# Patient Record
Sex: Male | Born: 1980
Health system: Southern US, Community
[De-identification: ages and names within clinical notes are randomized; demographics above are authoritative.]

## PROBLEM LIST (undated history)

## (undated) DIAGNOSIS — F32A Depression, unspecified: Secondary | ICD-10-CM

## (undated) DIAGNOSIS — F329 Major depressive disorder, single episode, unspecified: Secondary | ICD-10-CM

## (undated) DIAGNOSIS — D126 Benign neoplasm of colon, unspecified: Secondary | ICD-10-CM

## (undated) DIAGNOSIS — F419 Anxiety disorder, unspecified: Secondary | ICD-10-CM

## (undated) DIAGNOSIS — F429 Obsessive-compulsive disorder, unspecified: Secondary | ICD-10-CM

## (undated) DIAGNOSIS — I10 Essential (primary) hypertension: Secondary | ICD-10-CM

## (undated) DIAGNOSIS — G4730 Sleep apnea, unspecified: Secondary | ICD-10-CM

## (undated) DIAGNOSIS — F952 Tourette's disorder: Secondary | ICD-10-CM

## (undated) DIAGNOSIS — R7302 Impaired glucose tolerance (oral): Secondary | ICD-10-CM

## (undated) DIAGNOSIS — E669 Obesity, unspecified: Secondary | ICD-10-CM

## (undated) DIAGNOSIS — F609 Personality disorder, unspecified: Secondary | ICD-10-CM

## (undated) DIAGNOSIS — I8393 Asymptomatic varicose veins of bilateral lower extremities: Secondary | ICD-10-CM

## (undated) DIAGNOSIS — G4700 Insomnia, unspecified: Secondary | ICD-10-CM

## (undated) DIAGNOSIS — K219 Gastro-esophageal reflux disease without esophagitis: Secondary | ICD-10-CM

## (undated) HISTORY — DX: Anxiety disorder, unspecified: F41.9

## (undated) HISTORY — DX: Impaired glucose tolerance (oral): R73.02

## (undated) HISTORY — DX: Insomnia, unspecified: G47.00

## (undated) HISTORY — DX: Tourette's disorder: F95.2

## (undated) HISTORY — DX: Essential (primary) hypertension: I10

## (undated) HISTORY — DX: Personality disorder, unspecified: F60.9

## (undated) HISTORY — DX: Obsessive-compulsive disorder, unspecified: F42.9

## (undated) HISTORY — DX: Asymptomatic varicose veins of bilateral lower extremities: I83.93

## (undated) HISTORY — DX: Obesity, unspecified: E66.9

## (undated) HISTORY — DX: Major depressive disorder, single episode, unspecified: F32.9

## (undated) HISTORY — DX: Depression, unspecified: F32.A

## (undated) HISTORY — DX: Benign neoplasm of colon, unspecified: D12.6

## (undated) HISTORY — DX: Gastro-esophageal reflux disease without esophagitis: K21.9

---
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
## 2003-06-24 ENCOUNTER — Ambulatory Visit (HOSPITAL_COMMUNITY): Admission: RE | Admit: 2003-06-24 | Discharge: 2003-06-24 | Payer: Self-pay | Admitting: Internal Medicine

## 2004-03-08 ENCOUNTER — Ambulatory Visit: Payer: Self-pay | Admitting: Internal Medicine

## 2004-03-18 ENCOUNTER — Emergency Department (HOSPITAL_COMMUNITY): Admission: EM | Admit: 2004-03-18 | Discharge: 2004-03-18 | Payer: Self-pay | Admitting: Emergency Medicine

## 2004-03-23 ENCOUNTER — Ambulatory Visit: Payer: Self-pay | Admitting: Internal Medicine

## 2004-03-28 ENCOUNTER — Ambulatory Visit: Payer: Self-pay | Admitting: Internal Medicine

## 2004-03-30 ENCOUNTER — Ambulatory Visit: Payer: Self-pay | Admitting: Internal Medicine

## 2005-12-16 ENCOUNTER — Ambulatory Visit: Payer: Self-pay | Admitting: Family Medicine

## 2006-05-02 ENCOUNTER — Ambulatory Visit: Payer: Self-pay | Admitting: Internal Medicine

## 2006-05-02 LAB — CONVERTED CEMR LAB
ALT: 58 units/L — ABNORMAL HIGH (ref 0–40)
AST: 37 units/L (ref 0–37)
Basophils Relative: 0.3 % (ref 0.0–1.0)
Bilirubin, Direct: 0.2 mg/dL (ref 0.0–0.3)
CO2: 29 meq/L (ref 19–32)
Calcium: 9.5 mg/dL (ref 8.4–10.5)
Chloride: 107 meq/L (ref 96–112)
Creatinine, Ser: 1 mg/dL (ref 0.4–1.5)
Eosinophils Relative: 3.3 % (ref 0.0–5.0)
GFR calc non Af Amer: 97 mL/min
Glucose, Bld: 110 mg/dL — ABNORMAL HIGH (ref 70–99)
HCT: 50 % (ref 39.0–52.0)
Neutrophils Relative %: 50.7 % (ref 43.0–77.0)
Platelets: 252 10*3/uL (ref 150–400)
RBC: 5.8 M/uL (ref 4.22–5.81)
RDW: 12.2 % (ref 11.5–14.6)
Sodium: 143 meq/L (ref 135–145)
Total Bilirubin: 1 mg/dL (ref 0.3–1.2)
Total CHOL/HDL Ratio: 6.2
Triglycerides: 111 mg/dL (ref 0–149)
VLDL: 22 mg/dL (ref 0–40)
WBC: 4.5 10*3/uL (ref 4.5–10.5)

## 2007-04-11 ENCOUNTER — Ambulatory Visit: Payer: Self-pay | Admitting: Internal Medicine

## 2007-04-11 DIAGNOSIS — M549 Dorsalgia, unspecified: Secondary | ICD-10-CM

## 2007-04-11 DIAGNOSIS — E739 Lactose intolerance, unspecified: Secondary | ICD-10-CM

## 2007-04-11 DIAGNOSIS — F419 Anxiety disorder, unspecified: Secondary | ICD-10-CM | POA: Insufficient documentation

## 2007-04-11 DIAGNOSIS — F411 Generalized anxiety disorder: Secondary | ICD-10-CM

## 2007-04-11 DIAGNOSIS — K219 Gastro-esophageal reflux disease without esophagitis: Secondary | ICD-10-CM | POA: Insufficient documentation

## 2007-04-11 DIAGNOSIS — J309 Allergic rhinitis, unspecified: Secondary | ICD-10-CM

## 2007-04-11 LAB — CONVERTED CEMR LAB
Hemoglobin, Urine: NEGATIVE
Nitrite: NEGATIVE
Urine Glucose: NEGATIVE mg/dL

## 2007-09-10 ENCOUNTER — Encounter: Admission: RE | Admit: 2007-09-10 | Discharge: 2007-09-10 | Payer: Self-pay | Admitting: Specialist

## 2007-09-11 ENCOUNTER — Ambulatory Visit: Payer: Self-pay | Admitting: Internal Medicine

## 2007-09-11 DIAGNOSIS — L03119 Cellulitis of unspecified part of limb: Secondary | ICD-10-CM

## 2007-09-11 DIAGNOSIS — L02619 Cutaneous abscess of unspecified foot: Secondary | ICD-10-CM

## 2008-01-16 ENCOUNTER — Encounter: Payer: Self-pay | Admitting: Internal Medicine

## 2008-07-23 ENCOUNTER — Ambulatory Visit: Payer: Self-pay | Admitting: Internal Medicine

## 2008-07-23 DIAGNOSIS — L219 Seborrheic dermatitis, unspecified: Secondary | ICD-10-CM | POA: Insufficient documentation

## 2008-07-23 DIAGNOSIS — R350 Frequency of micturition: Secondary | ICD-10-CM

## 2008-07-23 LAB — CONVERTED CEMR LAB
Albumin: 4.7 g/dL (ref 3.5–5.2)
Basophils Relative: 0.1 % (ref 0.0–3.0)
Bilirubin Urine: NEGATIVE
Bilirubin Urine: NEGATIVE
Blood in Urine, dipstick: NEGATIVE
CO2: 28 meq/L (ref 19–32)
Chloride: 104 meq/L (ref 96–112)
Eosinophils Absolute: 0 10*3/uL (ref 0.0–0.7)
Glucose, Urine, Semiquant: NEGATIVE
Hemoglobin, Urine: NEGATIVE
Hemoglobin: 17.6 g/dL — ABNORMAL HIGH (ref 13.0–17.0)
Hgb A1c MFr Bld: 5.5 % (ref 4.6–6.5)
Leukocytes, UA: NEGATIVE
Lymphs Abs: 1.3 10*3/uL (ref 0.7–4.0)
MCHC: 35.3 g/dL (ref 30.0–36.0)
MCV: 87.7 fL (ref 78.0–100.0)
Monocytes Absolute: 0.5 10*3/uL (ref 0.1–1.0)
Neutro Abs: 4.1 10*3/uL (ref 1.4–7.7)
Nitrite: NEGATIVE
Protein, U semiquant: NEGATIVE
RBC: 5.7 M/uL (ref 4.22–5.81)
Sodium: 140 meq/L (ref 135–145)
Total Protein, Urine: NEGATIVE mg/dL
Total Protein: 8.2 g/dL (ref 6.0–8.3)
Urobilinogen, UA: 0.2
WBC Urine, dipstick: NEGATIVE
pH: 8.5

## 2008-07-24 ENCOUNTER — Encounter: Payer: Self-pay | Admitting: Internal Medicine

## 2008-08-25 ENCOUNTER — Ambulatory Visit: Payer: Self-pay | Admitting: Internal Medicine

## 2008-08-25 DIAGNOSIS — R197 Diarrhea, unspecified: Secondary | ICD-10-CM

## 2008-12-25 ENCOUNTER — Ambulatory Visit: Payer: Self-pay | Admitting: Internal Medicine

## 2008-12-25 DIAGNOSIS — S300XXA Contusion of lower back and pelvis, initial encounter: Secondary | ICD-10-CM | POA: Insufficient documentation

## 2008-12-25 DIAGNOSIS — E559 Vitamin D deficiency, unspecified: Secondary | ICD-10-CM | POA: Insufficient documentation

## 2008-12-25 LAB — CONVERTED CEMR LAB: Vit D, 25-Hydroxy: 19 ng/mL — ABNORMAL LOW (ref 30–89)

## 2008-12-28 LAB — CONVERTED CEMR LAB
ALT: 73 units/L — ABNORMAL HIGH (ref 0–53)
Basophils Relative: 0.4 % (ref 0.0–3.0)
Bilirubin Urine: NEGATIVE
CO2: 30 meq/L (ref 19–32)
Chloride: 103 meq/L (ref 96–112)
Creatinine, Ser: 0.9 mg/dL (ref 0.4–1.5)
Eosinophils Relative: 2.1 % (ref 0.0–5.0)
HCT: 52 % (ref 39.0–52.0)
Hemoglobin: 17.8 g/dL — ABNORMAL HIGH (ref 13.0–17.0)
Lymphs Abs: 1.4 10*3/uL (ref 0.7–4.0)
MCV: 90.7 fL (ref 78.0–100.0)
Monocytes Absolute: 0.6 10*3/uL (ref 0.1–1.0)
Neutro Abs: 4.1 10*3/uL (ref 1.4–7.7)
Nitrite: NEGATIVE
Potassium: 3.8 meq/L (ref 3.5–5.1)
RBC: 5.74 M/uL (ref 4.22–5.81)
Sodium: 141 meq/L (ref 135–145)
TSH: 0.87 microintl units/mL (ref 0.35–5.50)
Total Protein, Urine: NEGATIVE mg/dL
Total Protein: 8.1 g/dL (ref 6.0–8.3)
WBC: 6.2 10*3/uL (ref 4.5–10.5)
pH: 6.5 (ref 5.0–8.0)

## 2009-01-04 ENCOUNTER — Telehealth: Payer: Self-pay | Admitting: Internal Medicine

## 2009-02-09 ENCOUNTER — Telehealth: Payer: Self-pay | Admitting: Internal Medicine

## 2009-02-15 ENCOUNTER — Ambulatory Visit: Payer: Self-pay | Admitting: Internal Medicine

## 2009-02-15 DIAGNOSIS — N410 Acute prostatitis: Secondary | ICD-10-CM

## 2009-02-15 LAB — CONVERTED CEMR LAB
Nitrite: NEGATIVE
Specific Gravity, Urine: 1.015
Urobilinogen, UA: 0.2
WBC Urine, dipstick: NEGATIVE

## 2009-03-09 ENCOUNTER — Encounter: Payer: Self-pay | Admitting: Internal Medicine

## 2009-03-10 ENCOUNTER — Encounter: Payer: Self-pay | Admitting: Internal Medicine

## 2009-03-17 ENCOUNTER — Telehealth: Payer: Self-pay | Admitting: Internal Medicine

## 2009-03-23 ENCOUNTER — Encounter: Payer: Self-pay | Admitting: Internal Medicine

## 2009-08-16 ENCOUNTER — Ambulatory Visit: Payer: Self-pay | Admitting: Internal Medicine

## 2009-08-16 DIAGNOSIS — F410 Panic disorder [episodic paroxysmal anxiety] without agoraphobia: Secondary | ICD-10-CM

## 2009-08-28 ENCOUNTER — Ambulatory Visit: Payer: Self-pay | Admitting: Family Medicine

## 2009-08-30 ENCOUNTER — Telehealth: Payer: Self-pay | Admitting: Internal Medicine

## 2009-09-18 ENCOUNTER — Ambulatory Visit: Payer: Self-pay | Admitting: Family Medicine

## 2009-09-21 ENCOUNTER — Ambulatory Visit: Payer: Self-pay | Admitting: Internal Medicine

## 2009-09-21 DIAGNOSIS — M542 Cervicalgia: Secondary | ICD-10-CM | POA: Insufficient documentation

## 2009-11-10 ENCOUNTER — Ambulatory Visit: Payer: Self-pay | Admitting: Internal Medicine

## 2010-01-05 ENCOUNTER — Ambulatory Visit: Payer: Self-pay | Admitting: Internal Medicine

## 2010-01-05 DIAGNOSIS — R42 Dizziness and giddiness: Secondary | ICD-10-CM

## 2010-01-06 LAB — CONVERTED CEMR LAB
Bilirubin Urine: NEGATIVE
Leukocytes, UA: NEGATIVE
Urobilinogen, UA: 0.2 (ref 0.0–1.0)
pH: 6.5 (ref 5.0–8.0)

## 2010-01-12 ENCOUNTER — Telehealth: Payer: Self-pay | Admitting: Internal Medicine

## 2010-01-14 ENCOUNTER — Ambulatory Visit: Payer: Self-pay | Admitting: Internal Medicine

## 2010-01-18 LAB — CONVERTED CEMR LAB
BUN: 17 mg/dL (ref 6–23)
Basophils Absolute: 0 10*3/uL (ref 0.0–0.1)
Chloride: 104 meq/L (ref 96–112)
Creatinine, Ser: 1.1 mg/dL (ref 0.4–1.5)
Eosinophils Absolute: 0.3 10*3/uL (ref 0.0–0.7)
Eosinophils Relative: 4 % (ref 0.0–5.0)
MCV: 89.2 fL (ref 78.0–100.0)
Monocytes Absolute: 0.8 10*3/uL (ref 0.1–1.0)
Neutrophils Relative %: 58.5 % (ref 43.0–77.0)
Platelets: 250 10*3/uL (ref 150.0–400.0)
RDW: 13 % (ref 11.5–14.6)
TSH: 0.82 microintl units/mL (ref 0.35–5.50)
WBC: 7.2 10*3/uL (ref 4.5–10.5)

## 2010-02-17 ENCOUNTER — Telehealth (INDEPENDENT_AMBULATORY_CARE_PROVIDER_SITE_OTHER): Payer: Self-pay | Admitting: *Deleted

## 2010-04-05 NOTE — Letter (Signed)
Summary: Generic Letter  [facility]  [address] [place], [place] [address]   Phone: [phone]  Fax: [phone]    03/23/2009  BURLIN MCNAIR [address] [place], [place]  [address]   Mr. MAZZONI has been on disability. He remains disabled.           Sincerely,   Jacinta Shoe MD

## 2010-04-05 NOTE — Progress Notes (Signed)
Summary: Disability letter  Phone Note Call from Patient   Summary of Call: Pt says that he gave Dr a tax form regarding pt's disability. Have you seen this?  Initial call taken by: Lamar Sprinkles, CMA,  March 17, 2009 1:54 PM  Follow-up for Phone Call        Pt called again. He says he needs a letter to send to [employer] tax dept confirming that he is disabled.  Follow-up by: Lamar Sprinkles, CMA,  March 19, 2009 3:03 PM  Additional Follow-up for Phone Call Additional follow up Details #1::        OK Additional Follow-up by: Tresa Garter MD,  March 23, 2009 7:31 AM    Additional Follow-up for Phone Call Additional follow up Details #2::    LETTER MAILED TO PATIENT. Follow-up by: Daphane Shepherd,  March 23, 2009 11:18 AM

## 2010-04-05 NOTE — Assessment & Plan Note (Signed)
Summary: NOT FEELING WELL//VGJ   Vital Signs:  Patient profile:   30 year old male Height:      71 inches Weight:      228 pounds BMI:     31.91 O2 Sat:      97 % on Room air Temp:     97.4 degrees F oral Pulse rate:   88 / minute BP sitting:   146 / 84  (left arm) Cuff size:   large  Vitals Entered By: Payton Spark CMA (August 28, 2009 10:00 AM)  O2 Flow:  Room air CC: ST, fever and painful bumps on tongue x 1 day.   History of Present Illness: Sat clinic visit. Onset yesterday of sore throat, low grade fever, chills. No cough or nasal congestion.  No rash, nausea, or vomiting. Generalized malaise since onset of sore throat.  No sick contacts.  Allergies: 1)  ! Pcn 2)  ! * Seroquel  Past History:  Past Medical History: Last updated: 12/25/2008 Anxiety/panic OCD ADD/oppositional defiant disorder of adolescence personality disorder with schizoid features paranoia Tourette's -  per [facility] neurology insomnia borderline HTN obesity Allergic rhinitis glucose intolerance GERD Vit D def PMH reviewed for relevance  Review of Systems      See HPI  Physical Exam  General:  Well-developed,well-nourished,in no acute distress; alert,appropriate and cooperative throughout examination Ears:  External ear exam shows no significant lesions or deformities.  Otoscopic examination reveals clear canals, tympanic membranes are intact bilaterally without bulging, retraction, inflammation or discharge. Hearing is grossly normal bilaterally. Nose:  External nasal examination shows no deformity or inflammation. Nasal mucosa are pink and moist without lesions or exudates. Mouth:  mild erythema without exudate. Neck:  No deformities, masses, or tenderness noted. Lungs:  Normal respiratory effort, chest expands symmetrically. Lungs are clear to auscultation, no crackles or wheezes. Heart:  Normal rate and regular rhythm. S1 and S2 normal without gallop, murmur, click, rub or other extra  sounds. Cervical Nodes:  No lymphadenopathy noted   Impression & Recommendations:  Problem # 1:  SORE THROAT (ICD-462)  rapid strep neg.  Suspect viral. His updated medication list for this problem includes:    Ibuprofen 600 Mg Tabs (Ibuprofen) .[name]... 1 by mouth two times a day pc  Orders: Rapid Strep (09811)  Complete Medication List: 1)  Abilify 10 Mg Tabs (Aripiprazole) .... At bedtime 2)  Nizoral 2 % Sham (Ketoconazole) .... Clean scalp and face bid 3)  Remeron 15 Mg Tabs (Mirtazapine) .... Once daily 4)  Ibuprofen 600 Mg Tabs (Ibuprofen) .[name].. 1 by mouth two times a day pc 5)  Lorazepam 1 Mg Tabs (Lorazepam) .[name].. 1 by mouth up to  three times a day as needed anxiety 6)  Trazodone Hcl 50 Mg Tabs (Trazodone hcl) .[name].. 1 by mouth qhs  Patient Instructions: 1)  Consider Alleve 2 tablets every 12 hours as needed for sore throat. 2)  Drinks plenty of fluids. 3)  Your strep screen was negative.  Laboratory Results    Other Tests  Rapid Strep: negative

## 2010-04-05 NOTE — Progress Notes (Signed)
Summary: REQ FOR LABS  Phone Note Call from Patient Call back at Home Phone [phone]   Summary of Call: Pt continues to c/o dizzyness. he is req labs.  Initial call taken by: Lamar Sprinkles, CMA,  January 12, 2010 4:46 PM  Follow-up for Phone Call        OK BMET, CBC, TSH 995.20 Follow-up by: Tresa Garter MD,  January 14, 2010 9:07 AM  Additional Follow-up for Phone Call Additional follow up Details #1::        Pt informed  Additional Follow-up by: Lamar Sprinkles, CMA,  January 14, 2010 9:10 AM

## 2010-04-05 NOTE — Assessment & Plan Note (Signed)
Summary: MOUTH RASH --- DR AVP PT---STC   Vital Signs:  Patient profile:   30 year old male Height:      71 inches (180.34 cm) Weight:      225.75 pounds (102.61 kg) BMI:     31.60 O2 Sat:      97 % on Room air Temp:     97.6 degrees F (36.44 degrees C) oral Pulse rate:   77 / minute BP sitting:   118 / 80  (left arm) Cuff size:   large  Vitals Entered By: Brenton Grills MA (September 18, 2009 8:58 AM)  O2 Flow:  Room air CC: pt c/orash on tongue and roof of mouth with some burning/aj Comments Pt is no longer taking Ibuprofen or Trazodone   Primary Care Provider:  Georgina Quint Plotnikov MD  CC:  pt c/orash on tongue and roof of mouth with some burning/aj.  History of Present Illness: 30 y/o male w 3 days h/o of painful lesions on hid tongue.  ros neg  Current Medications (verified): 1)  Abilify 10 Mg  Tabs (Aripiprazole) .... At Bedtime 2)  Nizoral 2 % Sham (Ketoconazole) .... Clean Scalp and Face Bid 3)  Remeron 15 Mg Tabs (Mirtazapine) .... Once Daily 4)  Ibuprofen 600 Mg  Tabs (Ibuprofen) .[name].. 1 By Mouth Two Times A Day Pc 5)  Lorazepam 1 Mg Tabs (Lorazepam) .[name].. 1 By Mouth Up To  Three Times A Day As Needed Anxiety 6)  Trazodone Hcl 50 Mg Tabs (Trazodone Hcl) .[name].. 1 By Mouth Qhs  Allergies (verified): 1)  ! Pcn 2)  ! * Seroquel  Physical Exam  General:  Well-developed,well-nourished,in no acute distress; alert,appropriate and cooperative throughout examination Mouth:  ulcerated lesions c/w hsv   Problems:  Medical Problems Added: 1)  Dx of Herpes Simplex Infection Any Site  (ICD-054.9)  Impression & Recommendations:  Problem # 1:  HERPES SIMPLEX INFECTION ANY SITE (ICD-054.9) Assessment New  Orders: Prescription Created Electronically [id number])  Complete Medication List: 1)  Abilify 10 Mg Tabs (Aripiprazole) .... At bedtime 2)  Nizoral 2 % Sham (Ketoconazole) .... Clean scalp and face bid 3)  Remeron 15 Mg Tabs (Mirtazapine) .... Once daily 4)  Ibuprofen 600  Mg Tabs (Ibuprofen) .[name].. 1 by mouth two times a day pc 5)  Lorazepam 1 Mg Tabs (Lorazepam) .[name].. 1 by mouth up to  three times a day as needed anxiety 6)  Trazodone Hcl 50 Mg Tabs (Trazodone hcl) .[name].. 1 by mouth qhs 7)  Zovirax 400 Mg Tabs (Acyclovir) .... Take 1 tablet by mouth three times a day  Patient Instructions: 1)  zovirax 600mg .  3 x days until well Prescriptions: ZOVIRAX 400 MG TABS (ACYCLOVIR) Take 1 tablet by mouth three times a day  #30 x 3   Entered and Authorized by:   Roderick Pee MD   Signed by:   Roderick Pee MD on 09/18/2009   Method used:   Electronically to        [facility]  [phone]* (retail)       [address] [place], [place]  [address]       Ph: [phone] or [phone]       Fax: [phone]   RxID:   [id number]

## 2010-04-05 NOTE — Letter (Signed)
Summary: Request for a disability letter/Patient  Request for a disability letter/Patient   Imported By: Sherian Rein 03/30/2009 14:11:40  _____________________________________________________________________  External Attachment:    Type:   Image     Comment:   External Document

## 2010-04-05 NOTE — Assessment & Plan Note (Signed)
Summary: FEELING NERVOUS-OK PER [facility]-STC   Vital Signs:  Patient profile:   30 year old male Height:      71 inches Weight:      227.25 pounds BMI:     31.81 O2 Sat:      96 % on Room air Temp:     97.3 degrees F oral Pulse rate:   87 / minute BP sitting:   130 / 76  (left arm) Cuff size:   regular  Vitals Entered By: Lucious Groves (August 16, 2009 9:59 AM)  O2 Flow:  Room air CC: C/O feeling nervous./kb Is Patient Diabetic? No Pain Assessment Patient in pain? no      Comments Patient notes that he is not taking Ibuprofen./kb   Primary Care Provider:  Tresa Garter MD  CC:  C/O feeling nervous./kb.  History of Present Illness: C/o anxiety, panic attacks at work  Current Medications (verified): 1)  Xanax 0.25 Mg  Tabs (Alprazolam) .... Three Times A Day 2)  Abilify 10 Mg  Tabs (Aripiprazole) .... At Bedtime 3)  Nizoral 2 % Sham (Ketoconazole) .... Clean Scalp and Face Bid 4)  Remeron 15 Mg Tabs (Mirtazapine) .... Once Daily 5)  Ibuprofen 600 Mg  Tabs (Ibuprofen) .[name].. 1 By Mouth Two Times A Day Pc  Allergies (verified): 1)  ! Pcn 2)  ! * Seroquel  Past History:  Past Medical History: Last updated: 12/25/2008 Anxiety/panic OCD ADD/oppositional defiant disorder of adolescence personality disorder with schizoid features paranoia Tourette's -  per [facility] neurology insomnia borderline HTN obesity Allergic rhinitis glucose intolerance GERD Vit D def  Social History: Last updated: 02/15/2009 Single Never Smoked Alcohol use-no Drug use-no Regular exercise-no  Review of Systems  The patient denies fever and dyspnea on exertion.    Physical Exam  General:  Obese. alert, well-nourished, well-hydrated, cooperative to examination, and uncomfortable-appearing.   Mouth:  Bilateral Nasolabial fold scaling and papules. Oral mucosa and oropharynx without lesions or exudates.  Teeth in good repair. Neck:  No deformities, masses, or tenderness noted. Lungs:   Normal respiratory effort, chest expands symmetrically. Lungs are clear to auscultation, no crackles or wheezes. Heart:  Mild Tachycardia. Regular rhythm. S1 and S2 normal without gallop, murmur, click, rub or other extra sounds   Abdomen:  Protuberant. soft, normal bowel sounds, no guarding, no rigidity, no rebound tenderness, no abdominal hernia, no inguinal hernia, no hepatomegaly, no splenomegaly, and epigastric tenderness.   Msk:  No deformity or scoliosis noted of thoracic or lumbar spine.  B ischial bones are tender Neurologic:  Slight tremor in UEs Skin:  turgor normal, color normal, no rashes, no suspicious lesions, no ecchymoses, no petechiae, no purpura, no ulcerations, and no edema.   Psych:  Oriented X3, memory intact for recent and remote, normally interactive slightly anxious.     Impression & Recommendations:  Problem # 1:  PANIC ATTACK (ICD-300.01) Assessment New  The following medications were removed from the medication list:    Xanax 0.25 Mg Tabs (Alprazolam) .[name]... Three times a day His updated medication list for this problem includes:    Remeron 15 Mg Tabs (Mirtazapine) ..... Once daily    Lorazepam 1 Mg Tabs (Lorazepam) .[name]... 1 by mouth up to  three times a day as needed anxiety    Trazodone Hcl 50 Mg Tabs (Trazodone hcl) .[name]... 1 by mouth at bedtime  Problem # 2:  ANXIETY (ICD-300.00) Assessment: Deteriorated  The following medications were removed from the medication list:  Xanax 0.25 Mg Tabs (Alprazolam) .[name]... Three times a day His updated medication list for this problem includes:    Remeron 15 Mg Tabs (Mirtazapine) ..... Once daily    Lorazepam 1 Mg Tabs (Lorazepam) .[name]... 1 by mouth up to  three times a day as needed anxiety    Trazodone Hcl 50 Mg Tabs (Trazodone hcl) .[name]... 1 by mouth qhs  Complete Medication List: 1)  Abilify 10 Mg Tabs (Aripiprazole) .... At bedtime 2)  Nizoral 2 % Sham (Ketoconazole) .... Clean scalp and face bid 3)  Remeron 15 Mg  Tabs (Mirtazapine) .... Once daily 4)  Ibuprofen 600 Mg Tabs (Ibuprofen) .[name].. 1 by mouth two times a day pc 5)  Lorazepam 1 Mg Tabs (Lorazepam) .[name].. 1 by mouth up to  three times a day as needed anxiety 6)  Trazodone Hcl 50 Mg Tabs (Trazodone hcl) .[name].. 1 by mouth qhs  Patient Instructions: 1)  Please schedule a follow-up appointment in 1 month. 2)  Call if you are not better in a reasonable amount of time or if worse.  Prescriptions: TRAZODONE HCL 50 MG TABS (TRAZODONE HCL) 1 by mouth qhs  #30 x 3   Entered and Authorized by:   Tresa Garter MD   Signed by:   Tresa Garter MD on 08/16/2009   Method used:   Print then Give to Patient   RxID:   [id number] LORAZEPAM 1 MG TABS (LORAZEPAM) 1 by mouth up to  three times a day as needed anxiety  #60 x 3   Entered and Authorized by:   Tresa Garter MD   Signed by:   Tresa Garter MD on 08/16/2009   Method used:   Print then Give to Patient   RxID:   [id number]

## 2010-04-05 NOTE — Assessment & Plan Note (Signed)
Summary: back pain/#cd   Vital Signs:  Patient profile:   30 year old male Height:      71 inches Weight:      229 pounds BMI:     32.05 Temp:     98.3 degrees F oral Pulse rate:   81 / minute Pulse rhythm:   regular Resp:     16 per minute BP sitting:   120 / 88  (left arm) Cuff size:   regular  Vitals Entered By: Lanier Prude, CMA(AAMA) (January 05, 2010 3:27 PM) CC: upper back/neck pain, "bladder pain" & dizziness X 2 days Is Patient Diabetic? No Comments pt states he has been taking Ibuprofen and it has helped with pain.  He states he is not currently taking Hydroco-Acetam but does have some.   Primary Care Provider:  Tresa Garter MD  CC:  upper back/neck pain and "bladder pain" & dizziness X 2 days.  History of Present Illness: C/o neck pain  more on L x 3 d - better now C/o dizziness C/o LBP and face rash  Current Medications (verified): 1)  Abilify 10 Mg  Tabs (Aripiprazole) .... At Bedtime 2)  Nizoral 2 % Sham (Ketoconazole) .... Clean Scalp and Face Bid 3)  Remeron 45 Mg Tabs (Mirtazapine) .[name].. 1 Once Daily 4)  Ibuprofen 600 Mg  Tabs (Ibuprofen) .[name].. 1 By Mouth Two Times A Day Pc 5)  Lorazepam 1 Mg Tabs (Lorazepam) .[name].. 1 By Mouth Up To  Three Times A Day As Needed Anxiety 6)  Trazodone Hcl 50 Mg Tabs (Trazodone Hcl) .[name].. 1 By Mouth Qhs 7)  Zovirax 400 Mg Tabs (Acyclovir) .... Take 1 Tablet By Mouth Three Times A Day 8)  Pennsaid 1.5 % Soln (Diclofenac Sodium) .... 3-5 Gtt On Skin Three Times A Day For Pain 9)  Ibuprofen 600 Mg Tabs (Ibuprofen) .[name].. 1 By Mouth Bid  Pc X 1 Wk Then As Needed For  Pain 10)  Hydrocodone-Acetaminophen 5-325 Mg Tabs (Hydrocodone-Acetaminophen) .[name].. 1-2 By Mouth Two Times A Day As Needed Pain  Allergies (verified): 1)  ! Pcn 2)  ! * Seroquel  Review of Systems  The patient denies fever, headaches, and abdominal pain.    Physical Exam  General:  Well-developed,well-nourished,in no acute distress; alert,appropriate and  cooperative throughout examination Nose:  External nasal examination shows no deformity or inflammation. Nasal mucosa are pink and moist without lesions or exudates. Mouth:  ulcerated lesions c/w hsv healing Neck:  No deformities, masses, or tenderness noted. Lungs:  Normal respiratory effort, chest expands symmetrically. Lungs are clear to auscultation, no crackles or wheezes. Heart:  Normal rate and regular rhythm. S1 and S2 normal without gallop, murmur, click, rub or other extra sounds. Abdomen:  Protuberant. soft, normal bowel sounds, no guarding, no rigidity, no rebound tenderness, no abdominal hernia, no inguinal hernia, no hepatomegaly, no splenomegaly, and epigastric tenderness.   Msk:  No deformity or scoliosis noted of thoracic or lumbar spine.  Cervical spine is a little  tender to palpation over paraspinal muscles and with the ROM  Extremities:  No clubbing, cyanosis, edema, or deformity noted with normal full range of motion of all joints.   Neurologic:  Slight tremor in UEs Skin:  turgor normal, color normal, no rashes, no suspicious lesions, no ecchymoses, no petechiae, no purpura, no ulcerations, and no edema.   Psych:  Oriented X3, memory intact for recent and remote, normally interactive slightly anxious.     Impression & Recommendations:  Problem # 1:  NECK PAIN, ACUTE (ICD-723.1) MSK Assessment New  His updated medication list for this problem includes:    Ibuprofen 600 Mg Tabs (Ibuprofen) .[name]... 1 by mouth two times a day pc    Ibuprofen 600 Mg Tabs (Ibuprofen) .[name]... 1 by mouth bid  pc x 1 wk then as needed for  pain    Hydrocodone-acetaminophen 5-325 Mg Tabs (Hydrocodone-acetaminophen) .[name]... 1-2 by mouth two times a day as needed pain  Orders: T-Cervical Spine Comp w/Flex & Ext (72052TC)  Problem # 2:  PANIC ATTACK (ICD-300.01) Assessment: Unchanged  His updated medication list for this problem includes:    Remeron 45 Mg Tabs (Mirtazapine) .[name]... 1 once daily     Lorazepam 1 Mg Tabs (Lorazepam) .[name]... 1 by mouth up to  three times a day as needed anxiety    Trazodone Hcl 50 Mg Tabs (Trazodone hcl) .[name]... 1 by mouth qhs  Problem # 3:  BACK PAIN (ICD-724.5) MSK Assessment: Comment Only  His updated medication list for this problem includes:    Ibuprofen 600 Mg Tabs (Ibuprofen) .[name]... 1 by mouth two times a day pc    Ibuprofen 600 Mg Tabs (Ibuprofen) .[name]... 1 by mouth bid  pc x 1 wk then as needed for  pain    Hydrocodone-acetaminophen 5-325 Mg Tabs (Hydrocodone-acetaminophen) .[name]... 1-2 by mouth two times a day as needed pain  Orders: TLB-Udip ONLY (81003-UDIP)  Problem # 4:  DIZZINESS (ICD-780.4) due to meds, mild Assessment: New Will watch  Problem # 5:  SEBORRHEIC DERMATITIS (ICD-690.10) Assessment: Deteriorated Ketocon cream bid  Complete Medication List: 1)  Abilify 10 Mg Tabs (Aripiprazole) .... At bedtime 2)  Nizoral 2 % Sham (Ketoconazole) .... Clean scalp and face bid 3)  Remeron 45 Mg Tabs (Mirtazapine) .[name].. 1 once daily 4)  Ibuprofen 600 Mg Tabs (Ibuprofen) .[name].. 1 by mouth two times a day pc 5)  Lorazepam 1 Mg Tabs (Lorazepam) .[name].. 1 by mouth up to  three times a day as needed anxiety 6)  Trazodone Hcl 50 Mg Tabs (Trazodone hcl) .[name].. 1 by mouth qhs 7)  Zovirax 400 Mg Tabs (Acyclovir) .... Take 1 tablet by mouth three times a day 8)  Pennsaid 1.5 % Soln (Diclofenac sodium) .... 3-5 gtt on skin three times a day for pain 9)  Ibuprofen 600 Mg Tabs (Ibuprofen) .[name].. 1 by mouth bid  pc x 1 wk then as needed for  pain 10)  Hydrocodone-acetaminophen 5-325 Mg Tabs (Hydrocodone-acetaminophen) .[name].. 1-2 by mouth two times a day as needed pain 11)  Ketoconazole 2 % Crea (Ketoconazole) .... Use bid  Patient Instructions: 1)  Use stretching and balance exercises that I have provided (15 min. or longer every day)  2)  Please schedule a follow-up appointment in 1 month. Prescriptions: KETOCONAZOLE 2 % CREA (KETOCONAZOLE) use bid  #90 g x 3    Entered and Authorized by:   Tresa Garter MD   Signed by:   Tresa Garter MD on 01/05/2010   Method used:   Print then Give to Patient   RxID:   [id number] IBUPROFEN 600 MG TABS (IBUPROFEN) 1 by mouth bid  pc x 1 wk then as needed for  pain  #60 x 3   Entered and Authorized by:   Tresa Garter MD   Signed by:   Tresa Garter MD on 01/05/2010   Method used:   Print then Give to Patient   RxID:   [id number]  Orders Added: 1)  T-Cervical Spine Comp w/Flex & Ext [72052TC] 2)  TLB-Udip ONLY [81003-UDIP] 3)  Est. Patient Level IV [36644]

## 2010-04-05 NOTE — Assessment & Plan Note (Signed)
Summary: NECK AND BACK PAIN---STC   Vital Signs:  Patient profile:   30 year old male Height:      71 inches Weight:      227 pounds BMI:     31.77 O2 Sat:      98 % on Room air Temp:     99.4 degrees F oral Pulse rate:   109 / minute Pulse rhythm:   regular Resp:     16 per minute BP sitting:   130 / 88  (left arm) Cuff size:   regular  Vitals Entered By: Lanier Prude, CMA(AAMA) (November 10, 2009 3:48 PM)  O2 Flow:  Room air CC: Rt side neck pain Is Patient Diabetic? No Comments pt is not taking Trazodone. please remove from list   Primary Care Provider:  Georgina Quint Plotnikov MD  CC:  Rt side neck pain.  History of Present Illness: C/o neck pain R when woke up yesterday at 8 am. He did not sleep last night. Pain was severe and involved his trap too.  Current Medications (verified): 1)  Abilify 10 Mg  Tabs (Aripiprazole) .... At Bedtime 2)  Nizoral 2 % Sham (Ketoconazole) .... Clean Scalp and Face Bid 3)  Remeron 45 Mg Tabs (Mirtazapine) .[name].. 1 Once Daily 4)  Ibuprofen 600 Mg  Tabs (Ibuprofen) .[name].. 1 By Mouth Two Times A Day Pc 5)  Lorazepam 1 Mg Tabs (Lorazepam) .[name].. 1 By Mouth Up To  Three Times A Day As Needed Anxiety 6)  Trazodone Hcl 50 Mg Tabs (Trazodone Hcl) .[name].. 1 By Mouth Qhs 7)  Zovirax 400 Mg Tabs (Acyclovir) .... Take 1 Tablet By Mouth Three Times A Day  Allergies (verified): 1)  ! Pcn 2)  ! * Seroquel  Past History:  Past Medical History: Last updated: 12/25/2008 Anxiety/panic OCD ADD/oppositional defiant disorder of adolescence personality disorder with schizoid features paranoia Tourette's -  per [facility] insomnia borderline HTN obesity Allergic rhinitis glucose intolerance GERD Vit D def  Social History: Last updated: 02/15/2009 Single Never Smoked Alcohol use-no Drug use-no Regular exercise-no  Review of Systems  The patient denies fever, chest pain, dyspnea on exertion, and abdominal pain.    Physical  Exam  General:  Well-developed,well-nourished,in no acute distress; alert,appropriate and cooperative throughout examination Nose:  External nasal examination shows no deformity or inflammation. Nasal mucosa are pink and moist without lesions or exudates. Mouth:  ulcerated lesions c/w hsv healing Neck:  No deformities, masses, or tenderness noted. Lungs:  Normal respiratory effort, chest expands symmetrically. Lungs are clear to auscultation, no crackles or wheezes. Heart:  Normal rate and regular rhythm. S1 and S2 normal without gallop, murmur, click, rub or other extra sounds. Abdomen:  Protuberant. soft, normal bowel sounds, no guarding, no rigidity, no rebound tenderness, no abdominal hernia, no inguinal hernia, no hepatomegaly, no splenomegaly, and epigastric tenderness.   Msk:  No deformity or scoliosis noted of thoracic or lumbar spine.  B ischial bones are tender Neurologic:  Slight tremor in UEs Skin:  turgor normal, color normal, no rashes, no suspicious lesions, no ecchymoses, no petechiae, no purpura, no ulcerations, and no edema.   Psych:  Oriented X3, memory intact for recent and remote, normally interactive slightly anxious.     Impression & Recommendations:  Problem # 1:  NECK PAIN, ACUTE (ICD-723.1) R - MSK Assessment New See "Patient Instructions".  His updated medication list for this problem includes:    Ibuprofen 600 Mg Tabs (Ibuprofen) .[name]... 1 by mouth  two times a day pc    Ibuprofen 600 Mg Tabs (Ibuprofen) .[name]... 1 by mouth bid  pc x 1 wk then as needed for  pain    Hydrocodone-acetaminophen 5-325 Mg Tabs (Hydrocodone-acetaminophen) .[name]... 1-2 by mouth two times a day as needed pain  Complete Medication List: 1)  Abilify 10 Mg Tabs (Aripiprazole) .... At bedtime 2)  Nizoral 2 % Sham (Ketoconazole) .... Clean scalp and face bid 3)  Remeron 45 Mg Tabs (Mirtazapine) .[name].. 1 once daily 4)  Ibuprofen 600 Mg Tabs (Ibuprofen) .[name].. 1 by mouth two times a day pc 5)   Lorazepam 1 Mg Tabs (Lorazepam) .[name].. 1 by mouth up to  three times a day as needed anxiety 6)  Trazodone Hcl 50 Mg Tabs (Trazodone hcl) .[name].. 1 by mouth qhs 7)  Zovirax 400 Mg Tabs (Acyclovir) .... Take 1 tablet by mouth three times a day 8)  Pennsaid 1.5 % Soln (Diclofenac sodium) .... 3-5 gtt on skin three times a day for pain 9)  Ibuprofen 600 Mg Tabs (Ibuprofen) .[name].. 1 by mouth bid  pc x 1 wk then as needed for  pain 10)  Hydrocodone-acetaminophen 5-325 Mg Tabs (Hydrocodone-acetaminophen) .[name].. 1-2 by mouth two times a day as needed pain  Patient Instructions: 1)  Heat 2)  Use stretching exercises that I have provided (15 min. or longer every day) 3)  Contour pillow Prescriptions: HYDROCODONE-ACETAMINOPHEN 5-325 MG TABS (HYDROCODONE-ACETAMINOPHEN) 1-2 by mouth two times a day as needed pain  #20 x 0   Entered and Authorized by:   Tresa Garter MD   Signed by:   Tresa Garter MD on 11/10/2009   Method used:   Print then Give to Patient   RxID:   [id number] IBUPROFEN 600 MG TABS (IBUPROFEN) 1 by mouth bid  pc x 1 wk then as needed for  pain  #60 x 3   Entered and Authorized by:   Tresa Garter MD   Signed by:   Tresa Garter MD on 11/10/2009   Method used:   Print then Give to Patient   RxID:   [id number] PENNSAID 1.5 % SOLN (DICLOFENAC SODIUM) 3-5 gtt on skin three times a day for pain  #1 x 3   Entered and Authorized by:   Tresa Garter MD   Signed by:   Tresa Garter MD on 11/10/2009   Method used:   Print then Give to Patient   RxID:   [id number]

## 2010-04-05 NOTE — Letter (Signed)
[facility]   Imported By: Lester Adams 03/17/2009 10:13:38  _____________________________________________________________________  External Attachment:    Type:   Image     Comment:   External Document

## 2010-04-05 NOTE — Progress Notes (Signed)
Summary: Call Report  Phone Note Other Incoming   Caller: [facility] Call Report Summary of Call: [facility] Triage Call Report Triage Record Num: [id number] Operator: Craig Guess Patient Name: Bruce Ellison Call Date & Time: 08/28/2009 8:33:16AM Patient Phone: [phone] PCP: Sonda Primes Patient Gender: Male PCP Fax : [phone] Patient DOB: 11/29/1980 [facility] Name: [facility] Reason for Call: Pt calling for an appt for sore throat and bumps on his tongue. Sxs began yesterday 6/24. Afebrile. Advised office will open at 9am for appts. RN will call for appt and call pt back. 8:55 RN spoke with Erie Noe at office re an appt. Advised to have pt come now. Caller advised of same. Protocol(s) Used: Office Note Recommended Outcome per Protocol: Information Noted and Sent to Office Reason for Outcome: Caller information to office Care Advice:  ~ 06/ Initial call taken by: Margaret Pyle, CMA,  August 30, 2009 8:05 AM  Follow-up for Phone Call        Noted Follow-up by: Tresa Garter MD,  September 01, 2009 9:26 AM

## 2010-04-05 NOTE — Assessment & Plan Note (Signed)
Summary: RASH ON BACK/ BURNING /NWS   Vital Signs:  Patient profile:   30 year old male Height:      71 inches (180.34 cm) Weight:      226 pounds (102.73 kg) BMI:     31.63 O2 Sat:      96 % on Room air Temp:     98.0 degrees F (36.67 degrees C) oral Pulse rate:   107 / minute Pulse rhythm:   regular Resp:     16 per minute BP sitting:   130 / 80  (left arm) Cuff size:   regular  Vitals Entered By: Lanier Prude, CMA(AAMA) (September 21, 2009 4:20 PM)  O2 Flow:  Room air CC: painful rash on back/neck and blisters in mouth X 1 wk Is Patient Diabetic? No Comments pt is not taking Ibuprofen or trazodone   Primary Care Provider:  Tresa Garter MD  CC:  painful rash on back/neck and blisters in mouth X 1 wk.  History of Present Illness: C/o neck burning  and sore spots in throat and on tongue  Current Medications (verified): 1)  Abilify 10 Mg  Tabs (Aripiprazole) .... At Bedtime 2)  Nizoral 2 % Sham (Ketoconazole) .... Clean Scalp and Face Bid 3)  Remeron 30 Mg Tabs (Mirtazapine) .[name].. 1 Once Daily 4)  Ibuprofen 600 Mg  Tabs (Ibuprofen) .[name].. 1 By Mouth Two Times A Day Pc 5)  Lorazepam 1 Mg Tabs (Lorazepam) .[name].. 1 By Mouth Up To  Three Times A Day As Needed Anxiety 6)  Trazodone Hcl 50 Mg Tabs (Trazodone Hcl) .[name].. 1 By Mouth Qhs 7)  Zovirax 400 Mg Tabs (Acyclovir) .... Take 1 Tablet By Mouth Three Times A Day  Allergies (verified): 1)  ! Pcn 2)  ! * Seroquel  Past History:  Past Medical History: Last updated: 12/25/2008 Anxiety/panic OCD ADD/oppositional defiant disorder of adolescence personality disorder with schizoid features paranoia Tourette's -  per [facility] insomnia borderline HTN obesity Allergic rhinitis glucose intolerance GERD Vit D def  Review of Systems  The patient denies anorexia, fever, headaches, and abdominal pain.    Physical Exam  General:  Well-developed,well-nourished,in no acute distress; alert,appropriate and cooperative  throughout examination Nose:  External nasal examination shows no deformity or inflammation. Nasal mucosa are pink and moist without lesions or exudates. Mouth:  ulcerated lesions c/w hsv healing Neck:  No deformities, masses, or tenderness noted. Lungs:  Normal respiratory effort, chest expands symmetrically. Lungs are clear to auscultation, no crackles or wheezes. Heart:  Normal rate and regular rhythm. S1 and S2 normal without gallop, murmur, click, rub or other extra sounds. Abdomen:  Protuberant. soft, normal bowel sounds, no guarding, no rigidity, no rebound tenderness, no abdominal hernia, no inguinal hernia, no hepatomegaly, no splenomegaly, and epigastric tenderness.   Msk:  No deformity or scoliosis noted of thoracic or lumbar spine.  B ischial bones are tender Extremities:  No clubbing, cyanosis, edema, or deformity noted with normal full range of motion of all joints.   Neurologic:  Slight tremor in UEs Skin:  turgor normal, color normal, no rashes, no suspicious lesions, no ecchymoses, no petechiae, no purpura, no ulcerations, and no edema.   Psych:  Oriented X3, memory intact for recent and remote, normally interactive slightly anxious.     Impression & Recommendations:  Problem # 1:  NECK PAIN, ACUTE (ICD-723.1) due to #2 Assessment New  His updated medication list for this problem includes:    Ibuprofen 600 Mg Tabs (  Ibuprofen) .[name]... 1 by mouth two times a day pc  Problem # 2:  HERPES SIMPLEX INFECTION ANY SITE (ICD-054.9) mouth Assessment: New Finish Rx  Complete Medication List: 1)  Abilify 10 Mg Tabs (Aripiprazole) .... At bedtime 2)  Nizoral 2 % Sham (Ketoconazole) .... Clean scalp and face bid 3)  Remeron 30 Mg Tabs (Mirtazapine) .[name].. 1 once daily 4)  Ibuprofen 600 Mg Tabs (Ibuprofen) .[name].. 1 by mouth two times a day pc 5)  Lorazepam 1 Mg Tabs (Lorazepam) .[name].. 1 by mouth up to  three times a day as needed anxiety 6)  Trazodone Hcl 50 Mg Tabs (Trazodone hcl) .[name].. 1  by mouth qhs 7)  Zovirax 400 Mg Tabs (Acyclovir) .... Take 1 tablet by mouth three times a day  Patient Instructions: 1)  Call if you are not better in a reasonable amount of time or if worse.  Prescriptions: IBUPROFEN 600 MG  TABS (IBUPROFEN) 1 by mouth two times a day pc  #60 x 1   Entered and Authorized by:   Tresa Garter MD   Signed by:   Tresa Garter MD on 09/21/2009   Method used:   Electronically to        [facility]  [phone]* (retail)       [address] [place], [place]  [address]       Ph: [phone] or [phone]       Fax: [phone]   RxID:   [id number]

## 2010-04-05 NOTE — Progress Notes (Signed)
Summary: Call Report/AVP pt  Phone Note Other Incoming   Caller: [facility] Summary of Call: [facility] Triage Call Report Triage Record Num: [id number] Operator: Elita Boone Patient Name: Bruce Ellison Call Date & Time: 01/11/2010 8:12:26PM Patient Phone: [phone] PCP: Sonda Primes Patient Gender: Male PCP Fax : [phone] Patient DOB: [birth year] Practice Name: [facility] Reason for Call: Pt calling to report that he has dizzness every day . Onset 10/31. Pt report that he was in office with same symptoms on 11/ 02. Pt instructed to be seen in 4 hours. Home care advice given for dizzness. No emergent s/s. Protocol(s) Used: Dizziness or Vertigo Recommended Outcome per Protocol: See Provider within 4 hours Reason for Outcome: Having sensations of turning or spinning that affects balance AND not responsive to 4 hours of home care Care Advice: Call EMS 911 if patient develops confusion, decreased level of consciousness, chest pain, shortness of breath, or focal neurologic abnormalities such as facial droop or weakness of one extremity.  ~  ~ Call provider if symptoms worsen or new symptoms develop. Avoid caffeine (coffee, tea, cola drinks, or chocolate), alcohol, and nicotine (use of tobacco), as use of these substances may worsen symptoms.  ~  ~ SYMPTOM / CONDITION MANAGEMENT  ~ CAUTIONS  ~ List, or take, all current prescription(s), nonprescription or alternative medication(s) to provider for evaluation. 11/ Initial call taken by: Margaret Pyle, CMA,  January 12, 2010 10:43 AM

## 2010-04-07 NOTE — Progress Notes (Signed)
  Phone Note Other Incoming   Request: Send information Summary of Call: Request for records received from [facility]. Request forwarded to [employer].     

## 2010-04-22 ENCOUNTER — Encounter: Payer: Self-pay | Admitting: Internal Medicine

## 2010-04-22 ENCOUNTER — Ambulatory Visit (INDEPENDENT_AMBULATORY_CARE_PROVIDER_SITE_OTHER): Payer: Medicare Other | Admitting: Internal Medicine

## 2010-04-22 DIAGNOSIS — J019 Acute sinusitis, unspecified: Secondary | ICD-10-CM | POA: Insufficient documentation

## 2010-04-27 NOTE — Assessment & Plan Note (Signed)
Summary: SORE THROAT-COUGH--STUFFY NOSE-DR AVP PT NO CLINIC  STC   Vital Signs:  Patient profile:   30 year old male Height:      71 inches (180.34 cm) Weight:      225.38 pounds (102.45 kg) BMI:     31.55 O2 Sat:      95 % on Room air Temp:     99.2 degrees F (37.33 degrees C) oral Pulse rate:   95 / minute BP sitting:   126 / 78  (left arm) Cuff size:   large  Vitals Entered By: Brenton Grills CMA Duncan Dull) (April 22, 2010 3:31 PM)  O2 Flow:  Room air CC: Cough, sore throat, runny nose, Sinus Pressure, Congestion x 5 days/aj, URI symptoms Is Patient Diabetic? No Comments Pt has taken OTC Mucinex with no relief; Pt is no longer taking Trazadone, Zovirax, Pennsaid or Hydrocodone-APAP   Primary Care Provider:  Tresa Garter MD  CC:  Cough, sore throat, runny nose, Sinus Pressure, Congestion x 5 days/aj, and URI symptoms.  History of Present Illness:  URI Symptoms      This is a 30 year old man who presents with URI symptoms.  The symptoms began 6 days ago.  The severity is described as moderate.  The patient reports nasal congestion, clear nasal discharge, sore throat, productive cough, and sick contacts, but denies earache.  Associated symptoms include diarrhea.  The patient denies fever, dyspnea, wheezing, and vomiting.  The patient also reports headache and muscle aches.  The patient denies seasonal symptoms and response to antihistamine.  The patient denies the following risk factors for Strep sinusitis: Strep exposure, tender adenopathy, and absence of cough.    Clinical Review Panels:  Lipid Management   Cholesterol:  182 (05/02/2006)   LDL (bad choesterol):  130 (05/02/2006)   HDL (good cholesterol):  29.4 (05/02/2006)  CBC   WBC:  7.2 (01/14/2010)   RBC:  5.34 (01/14/2010)   Hgb:  16.6 (01/14/2010)   Hct:  47.6 (01/14/2010)   Platelets:  250.0 (01/14/2010)   MCV  89.2 (01/14/2010)   MCHC  34.9 (01/14/2010)   RDW  13.0 (01/14/2010)   PMN:  58.5  (01/14/2010)   Lymphs:  26.3 (01/14/2010)   Monos:  10.7 (01/14/2010)   Eosinophils:  4.0 (01/14/2010)   Basophil:  0.5 (01/14/2010)  Complete Metabolic Panel   Glucose:  112 (01/14/2010)   Sodium:  139 (01/14/2010)   Potassium:  4.0 (01/14/2010)   Chloride:  104 (01/14/2010)   CO2:  28 (01/14/2010)   BUN:  17 (01/14/2010)   Creatinine:  1.1 (01/14/2010)   Albumin:  4.6 (12/25/2008)   Total Protein:  8.1 (12/25/2008)   Calcium:  9.5 (01/14/2010)   Total Bili:  0.9 (12/25/2008)   Alk Phos:  71 (12/25/2008)   SGPT (ALT):  73 (12/25/2008)   SGOT (AST):  42 (12/25/2008)   Current Medications (verified): 1)  Abilify 10 Mg  Tabs (Aripiprazole) .... At Bedtime 2)  Nizoral 2 % Sham (Ketoconazole) .... Clean Scalp and Face Bid 3)  Remeron 45 Mg Tabs (Mirtazapine) .[name].. 1 Once Daily 4)  Ibuprofen 600 Mg  Tabs (Ibuprofen) .[name].. 1 By Mouth Two Times A Day Pc 5)  Lorazepam 1 Mg Tabs (Lorazepam) .[name].. 1 By Mouth Up To  Three Times A Day As Needed Anxiety 6)  Trazodone Hcl 50 Mg Tabs (Trazodone Hcl) .[name].. 1 By Mouth Qhs 7)  Zovirax 400 Mg Tabs (Acyclovir) .... Take 1 Tablet By Mouth Three Times  A Day 8)  Pennsaid 1.5 % Soln (Diclofenac Sodium) .... 3-5 Gtt On Skin Three Times A Day For Pain 9)  Ibuprofen 600 Mg Tabs (Ibuprofen) .[name].. 1 By Mouth Bid  Pc X 1 Wk Then As Needed For  Pain 10)  Hydrocodone-Acetaminophen 5-325 Mg Tabs (Hydrocodone-Acetaminophen) .[name].. 1-2 By Mouth Two Times A Day As Needed Pain  Allergies (verified): 1)  ! Pcn 2)  ! * Seroquel  Past History:  Past Medical History: Anxiety/panic OCD  ADD/oppositional defiant disorder of adolescence personality disorder with schizoid features paranoia Tourette's -  per [facility]  insomnia borderline HTN obesity Allergic rhinitis glucose intolerance GERD Vit D def  Review of Systems  The patient denies decreased hearing, hoarseness, chest pain, and abdominal pain.    Physical Exam  General:  alert, well-developed,  well-nourished, and cooperative to examination.   mildly ill Head:  Normocephalic and atraumatic without obvious abnormalities. No apparent alopecia or balding. Eyes:  vision grossly intact; pupils equal, round and reactive to light.  conjunctiva and lids normal.    Ears:  normal pinnae bilaterally, without erythema, swelling, or tenderness to palpation. TMs clear, without effusion, or cerumen impaction. Hearing grossly normal bilaterally  Nose:  tender over frontal sinuses L>R Mouth:  teeth and gums in good repair; mucous membranes moist, without lesions or ulcers. oropharynx clear without exudate, mild erythema.  Lungs:  normal respiratory effort, no intercostal retractions or use of accessory muscles; normal breath sounds bilaterally - no crackles and no wheezes.    Heart:  normal rate, regular rhythm, no murmur, and no rub. BLE without edema.  Psych:  Oriented X3, memory intact for recent and remote, normally interactive. slightly anxious.     Impression & Recommendations:  Problem # 1:  ACUTE SINUSITIS, UNSPECIFIED (ICD-461.9)  His updated medication list for this problem includes:    Levaquin 500 Mg Tabs (Levofloxacin) .[name]... 1 by mouth once daily x 7 days  Instructed on treatment. Call if symptoms persist or worsen.   Orders: Prescription Created Electronically [phone])  Complete Medication List: 1)  Abilify 10 Mg Tabs (Aripiprazole) .... At bedtime 2)  Nizoral 2 % Sham (Ketoconazole) .... Clean scalp and face bid 3)  Remeron 45 Mg Tabs (Mirtazapine) .[name].. 1 once daily 4)  Ibuprofen 600 Mg Tabs (Ibuprofen) .[name].. 1 by mouth two times a day pc 5)  Lorazepam 1 Mg Tabs (Lorazepam) .[name].. 1 by mouth up to  three times a day as needed anxiety 6)  Ibuprofen 600 Mg Tabs (Ibuprofen) .[name].. 1 by mouth bid  pc x 1 wk then as needed for  pain 7)  Levaquin 500 Mg Tabs (Levofloxacin) .[name].. 1 by mouth once daily x 7 days  Patient Instructions: 1)  it was good to see you today. 2)  levaquin for your  sinus symptoms - your prescriptions have been electronically submitted to your pharmacy. Please take as directed. Contact our office if you believe you're having problems with the medication(s).  3)  Get plenty of rest, drink lots of clear liquids, and use Tylenol or Ibuprofen for fever and comfort and mucinex for cough. Return in 7-10 days if you're not better:sooner if you're feeling worse. Prescriptions: LEVAQUIN 500 MG TABS (LEVOFLOXACIN) 1 by mouth once daily x 7 days  #7 x 0   Entered and Authorized by:   Newt Lukes MD   Signed by:   Newt Lukes MD on 04/22/2010   Method used:   [facility] to  [facility]  [phone]* (retail)       [address] [place], [place]  [address]       Ph: [phone] or [phone]       Fax: [phone]   RxID:   [id number]    Orders Added: 1)  Est. Patient Level IV [36644] 2)  Prescription Created Electronically [phone]

## 2010-05-11 ENCOUNTER — Encounter: Payer: Self-pay | Admitting: Internal Medicine

## 2010-05-11 ENCOUNTER — Ambulatory Visit (INDEPENDENT_AMBULATORY_CARE_PROVIDER_SITE_OTHER): Payer: Medicare Other | Admitting: Internal Medicine

## 2010-05-11 DIAGNOSIS — H8309 Labyrinthitis, unspecified ear: Secondary | ICD-10-CM

## 2010-05-17 NOTE — Letter (Signed)
Summary: Out of Work  [facility]  [address] [place], [place] [address]   Phone: [phone]  Fax: [phone]    May 11, 2010   Employee:  Bruce Ellison    To Whom It May Concern:   For Medical reasons, please excuse the above named employee from work for the following dates:  Start:   05/05/10  End:   05/16/10  If you need additional information, please feel free to contact our office.         Sincerely,    Etta Grandchild MD

## 2010-05-17 NOTE — Assessment & Plan Note (Signed)
Summary: DR AVP PT -NO CLINIC--DIZZINESS--NAUSEA---STC   Vital Signs:  Patient profile:   30 year old male Height:      71 inches Weight:      223 pounds BMI:     31.21 O2 Sat:      96 % on Room air Temp:     98.6 degrees F oral Pulse rate:   84 / minute Pulse rhythm:   regular Resp:     16 per minute BP supine:   140 / 82  (left arm) BP sitting:   138 / 80  (left arm) Cuff size:   large  Vitals Entered By: Rock Nephew CMA (May 11, 2010 1:59 PM)  Nutrition Counseling: Patient's BMI is greater than 25 and therefore counseled on weight management options.  O2 Flow:  Room air CC: Patient c/o dizziness and nausea x several days Is Patient Diabetic? No Pain Assessment Patient in pain? no       Does patient need assistance? Functional Status Self care Ambulation Normal   Primary Care Provider:  Georgina Quint Plotnikov MD  CC:  Patient c/o dizziness and nausea x several days.  History of Present Illness: He returns c/o dizziness, nausea, and vertigo for 4 days.  Preventive Screening-Counseling & Management  Alcohol-Tobacco     Alcohol drinks/day: 0     Alcohol Counseling: not indicated; patient does not drink     Smoking Status: never     Tobacco Counseling: not indicated; no tobacco use  Hep-HIV-STD-Contraception     Hepatitis Risk: no risk noted     HIV Risk: no risk noted     STD Risk: no risk noted      Drug Use:  no.    Clinical Review Panels:  Lipid Management   Cholesterol:  182 (05/02/2006)   LDL (bad choesterol):  130 (05/02/2006)   HDL (good cholesterol):  29.4 (05/02/2006)  Diabetes Management   HgBA1C:  5.5 (07/23/2008)   Creatinine:  1.1 (01/14/2010)  CBC   WBC:  7.2 (01/14/2010)   RBC:  5.34 (01/14/2010)   Hgb:  16.6 (01/14/2010)   Hct:  47.6 (01/14/2010)   Platelets:  250.0 (01/14/2010)   MCV  89.2 (01/14/2010)   MCHC  34.9 (01/14/2010)   RDW  13.0 (01/14/2010)   PMN:  58.5 (01/14/2010)   Lymphs:  26.3 (01/14/2010)   Monos:   10.7 (01/14/2010)   Eosinophils:  4.0 (01/14/2010)   Basophil:  0.5 (01/14/2010)  Complete Metabolic Panel   Glucose:  112 (01/14/2010)   Sodium:  139 (01/14/2010)   Potassium:  4.0 (01/14/2010)   Chloride:  104 (01/14/2010)   CO2:  28 (01/14/2010)   BUN:  17 (01/14/2010)   Creatinine:  1.1 (01/14/2010)   Albumin:  4.6 (12/25/2008)   Total Protein:  8.1 (12/25/2008)   Calcium:  9.5 (01/14/2010)   Total Bili:  0.9 (12/25/2008)   Alk Phos:  71 (12/25/2008)   SGPT (ALT):  73 (12/25/2008)   SGOT (AST):  42 (12/25/2008)   Medications Prior to Update: 1)  Abilify 10 Mg  Tabs (Aripiprazole) .... At Bedtime 2)  Nizoral 2 % Sham (Ketoconazole) .... Clean Scalp and Face Bid 3)  Remeron 45 Mg Tabs (Mirtazapine) .[name].. 1 Once Daily 4)  Ibuprofen 600 Mg  Tabs (Ibuprofen) .[name].. 1 By Mouth Two Times A Day Pc 5)  Lorazepam 1 Mg Tabs (Lorazepam) .[name].. 1 By Mouth Up To  Three Times A Day As Needed Anxiety 6)  Ibuprofen 600 Mg Tabs (  Ibuprofen) .[name].. 1 By Mouth Bid  Pc X 1 Wk Then As Needed For  Pain  Current Medications (verified): 1)  Abilify 10 Mg  Tabs (Aripiprazole) .... At Bedtime 2)  Nizoral 2 % Sham (Ketoconazole) .... Clean Scalp and Face Bid 3)  Remeron 45 Mg Tabs (Mirtazapine) .[name].. 1 Once Daily 4)  Ibuprofen 600 Mg  Tabs (Ibuprofen) .[name].. 1 By Mouth Two Times A Day Pc 5)  Lorazepam 1 Mg Tabs (Lorazepam) .[name].. 1 By Mouth Up To  Three Times A Day As Needed Anxiety 6)  Ibuprofen 600 Mg Tabs (Ibuprofen) .[name].. 1 By Mouth Bid  Pc X 1 Wk Then As Needed For  Pain 7)  Prednisone 50 Mg Tabs (Prednisone) .... One By Mouth Once Daily For 5 Days 8)  Meclizine Hcl 25 Mg Tabs (Meclizine Hcl) .... 1/2 - 1 By Mouth Three Times A Day As Needed For Dizziness  Allergies (verified): 1)  ! Pcn 2)  ! * Seroquel  Past History:  Past Medical History: Last updated: 04/22/2010 Anxiety/panic OCD  ADD/oppositional defiant disorder of adolescence personality disorder with schizoid features paranoia Tourette's -   per [facility] neurology  insomnia borderline HTN obesity Allergic rhinitis glucose intolerance GERD Vit D def  Past Surgical History: Last updated: 04/11/2007 Denies surgical history  Family History: Last updated: 04/11/2007 grandmother with gallstone  Social History: Last updated: 02/15/2009 Single Never Smoked Alcohol use-no Drug use-no Regular exercise-no  Risk Factors: Alcohol Use: 0 (05/11/2010) Exercise: no (02/15/2009)  Risk Factors: Smoking Status: never (05/11/2010)  Family History: Reviewed history from 04/11/2007 and no changes required. grandmother with gallstone  Social History: Reviewed history from 02/15/2009 and no changes required. Single Never Smoked Alcohol use-no Drug use-no Regular exercise-no Hepatitis Risk:  no risk noted HIV Risk:  no risk noted STD Risk:  no risk noted  Review of Systems       The patient complains of weight gain.  The patient denies anorexia, fever, weight loss, vision loss, decreased hearing, hoarseness, chest pain, syncope, dyspnea on exertion, peripheral edema, prolonged cough, headaches, hemoptysis, abdominal pain, muscle weakness, suspicious skin lesions, transient blindness, difficulty walking, unusual weight change, abnormal bleeding, and enlarged lymph nodes.   ENT:  Denies decreased hearing, difficulty swallowing, ear discharge, earache, nasal congestion, and sore throat. Neuro:  Complains of sensation of room spinning; denies brief paralysis, difficulty with concentration, disturbances in coordination, falling down, headaches, inability to speak, memory loss, numbness, poor balance, seizures, tingling, tremors, visual disturbances, and weakness.  Physical Exam  General:  alert, well-developed, well-nourished, well-hydrated, appropriate dress, cooperative to examination, good hygiene, and overweight-appearing.   Head:  normocephalic, atraumatic, no abnormalities observed, and no abnormalities palpated.   Eyes:   vision grossly intact, pupils equal, pupils round, pupils reactive to light, pupils react to accomodation, and no injection.   Ears:  R ear normal and L ear normal.   Nose:  External nasal examination shows no deformity or inflammation. Nasal mucosa are pink and moist without lesions or exudates. Mouth:  Oral mucosa and oropharynx without lesions or exudates.  Teeth in good repair. Neck:  No deformities, masses, or tenderness noted. Lungs:  normal respiratory effort, no intercostal retractions, no accessory muscle use, normal breath sounds, no dullness, no fremitus, no crackles, and no wheezes.   Heart:  normal rate, regular rhythm, no murmur, no gallop, no rub, and no JVD.   Abdomen:  Protuberant. soft, normal bowel sounds, no guarding, no rigidity, no rebound tenderness, no abdominal hernia, no  inguinal hernia, no hepatomegaly, no splenomegaly, and epigastric tenderness.   Msk:  No deformity or scoliosis noted of thoracic or lumbar spine.   Pulses:  R and L carotid,radial,femoral,dorsalis pedis and posterior tibial pulses are full and equal bilaterally Extremities:  No clubbing, cyanosis, edema, or deformity noted with normal full range of motion of all joints.   Neurologic:  alert & oriented X3, cranial nerves II-XII intact, strength normal in all extremities, sensation intact to light touch, sensation intact to pinprick, gait normal, DTRs symmetrical and normal, finger-to-nose normal, heel-to-shin normal, toes down bilaterally on Babinski, and Romberg negative.   Skin:  Intact without suspicious lesions or rashes Cervical Nodes:  No lymphadenopathy noted Axillary Nodes:  No palpable lymphadenopathy Psych:  Cognition and judgment appear intact. Alert and cooperative with normal attention span and concentration. No apparent delusions, illusions, hallucinations   Impression & Recommendations:  Problem # 1:  VIRAL LABYRINTHITIS (ICD-386.35) Assessment New will try steroids and meclizine and  give him a work note as requested  Complete Medication List: 1)  Abilify 10 Mg Tabs (Aripiprazole) .... At bedtime 2)  Nizoral 2 % Sham (Ketoconazole) .... Clean scalp and face bid 3)  Remeron 45 Mg Tabs (Mirtazapine) .[name].. 1 once daily 4)  Ibuprofen 600 Mg Tabs (Ibuprofen) .[name].. 1 by mouth two times a day pc 5)  Lorazepam 1 Mg Tabs (Lorazepam) .[name].. 1 by mouth up to  three times a day as needed anxiety 6)  Ibuprofen 600 Mg Tabs (Ibuprofen) .[name].. 1 by mouth bid  pc x 1 wk then as needed for  pain 7)  Prednisone 50 Mg Tabs (Prednisone) .... One by mouth once daily for 5 days 8)  Meclizine Hcl 25 Mg Tabs (Meclizine hcl) .... 1/2 - 1 by mouth three times a day as needed for dizziness  Patient Instructions: 1)  Please schedule a follow-up appointment in 2 weeks. 2)  Get plenty of rest, drink lots of clear liquids, and use Tylenol or Ibuprofen for fever and comfort. Return in 7-10 days if you're not better:sooner if you're feeling worse. Prescriptions: MECLIZINE HCL 25 MG TABS (MECLIZINE HCL) 1/2 - 1 by mouth three times a day as needed for dizziness  #35 x 0   Entered and Authorized by:   Etta Grandchild MD   Signed by:   Etta Grandchild MD on 05/11/2010   Method used:   Electronically to        [facility]  [address]  [phone]* (retail)       [address] [place], [place]  [address]       Ph: [phone] or [phone]       Fax: [phone]   RxID:   [id number] PREDNISONE 50 MG TABS (PREDNISONE) One by mouth once daily for 5 days  #5 x 0   Entered and Authorized by:   Etta Grandchild MD   Signed by:   Etta Grandchild MD on 05/11/2010   Method used:   [facility]ronically to        [facility]  [phone]* (retail)       [address] [place], [place]  [address]       Ph: [phone] or [phone]       Fax: [phone]   RxID:   [id number]    Orders Added: 1)  Est. Patient Level IV [42706]

## 2010-05-25 ENCOUNTER — Emergency Department (HOSPITAL_COMMUNITY)
Admission: EM | Admit: 2010-05-25 | Discharge: 2010-05-25 | Disposition: A | Discharge status: Home / Self Care | Payer: Medicare Other | Attending: Emergency Medicine | Admitting: Emergency Medicine

## 2010-05-25 DIAGNOSIS — Z79899 Other long term (current) drug therapy: Secondary | ICD-10-CM | POA: Insufficient documentation

## 2010-05-25 DIAGNOSIS — M542 Cervicalgia: Secondary | ICD-10-CM | POA: Insufficient documentation

## 2010-05-25 DIAGNOSIS — F209 Schizophrenia, unspecified: Secondary | ICD-10-CM | POA: Insufficient documentation

## 2010-06-24 ENCOUNTER — Encounter: Payer: Self-pay | Admitting: Internal Medicine

## 2010-06-24 ENCOUNTER — Ambulatory Visit (INDEPENDENT_AMBULATORY_CARE_PROVIDER_SITE_OTHER): Payer: Medicare Other | Admitting: Internal Medicine

## 2010-06-24 VITALS — BP 126/76 | HR 88 | Temp 98.3°F | Resp 16 | Ht 67.0 in | Wt 226.0 lb

## 2010-06-24 DIAGNOSIS — R51 Headache: Secondary | ICD-10-CM

## 2010-06-24 DIAGNOSIS — R42 Dizziness and giddiness: Secondary | ICD-10-CM

## 2010-06-24 DIAGNOSIS — H6120 Impacted cerumen, unspecified ear: Secondary | ICD-10-CM | POA: Insufficient documentation

## 2010-06-24 MED ORDER — DOXYCYCLINE HYCLATE 100 MG PO TABS: 100.0000 mg | ORAL_TABLET | Freq: Two times a day (BID) | ORAL | Status: AC

## 2010-06-24 MED ORDER — LORATADINE 10 MG PO TABS: 10.0000 mg | ORAL_TABLET | Freq: Every day | ORAL | Status: DC

## 2010-06-24 NOTE — Assessment & Plan Note (Signed)
Will irrigate 

## 2010-06-24 NOTE — Assessment & Plan Note (Signed)
He has orthostatic symptoms.

## 2010-06-24 NOTE — Progress Notes (Signed)
  Subjective:    Patient ID: Bruce Ellison, male    DOB: April 05, 1980, 30 y.o.   MRN: [id number]  HPI  C/o lightheadedness x a few months, worse with standing up. Worse in pm. "Heavy head" - worse with bending over and then standing up. His psychiatrist changed his meds - no help.  Review of Systems  Constitutional: Negative for chills.  HENT: Positive for congestion. Negative for rhinorrhea and postnasal drip.   Respiratory: Negative for cough, chest tightness and wheezing.   Cardiovascular: Negative for leg swelling.  Musculoskeletal: Negative for gait problem.  Neurological: Negative for speech difficulty.  Psychiatric/Behavioral: Negative for confusion, dysphoric mood and agitation.   Wt Readings from Last 3 Encounters:  06/24/10 226 lb (102.513 kg)  05/11/10 223 lb (101.152 kg)  04/22/10 225 lb 6.1 oz (102.232 kg)   BP Readings from Last 3 Encounters:  06/24/10 140/80  05/11/10 138/80  04/22/10 126/78        Objective:   Physical Exam  Constitutional: He is oriented to person, place, and time. He appears well-developed.       Obese NAD  HENT:  Mouth/Throat: Oropharynx is clear and moist.       R wax   Eyes: Conjunctivae are normal. Pupils are equal, round, and reactive to light.  Neck: Normal range of motion. No JVD present. No thyromegaly present.  Cardiovascular: Normal rate, regular rhythm, normal heart sounds and intact distal pulses.  Exam reveals no gallop and no friction rub.   No murmur heard. Pulmonary/Chest: Effort normal and breath sounds normal. No respiratory distress. He has no wheezes. He has no rales. He exhibits no tenderness.  Abdominal: Soft. Bowel sounds are normal. He exhibits no distension and no mass. There is no tenderness. There is no rebound and no guarding.  Musculoskeletal: Normal range of motion. He exhibits no edema and no tenderness.  Lymphadenopathy:    He has no cervical adenopathy.  Neurological: He is alert and oriented to person,  place, and time. He has normal reflexes. No cranial nerve deficit. He exhibits normal muscle tone. Coordination normal.  Skin: Skin is warm and dry. Rash (SD on face) noted.  Psychiatric: He has a normal mood and affect. His behavior is normal. Judgment and thought content normal.          Assessment & Plan:  DIZZINESS He has orthostatic symptoms. There is no ortho BP drop. CT brain. Doxy for possible sinusitis.  Cerumen impaction Will irrigate.  Ceruminosis is noted.     Procedure :   Ear irrigation R. Wax is removed by syringing and manual debridement. Instructions for home care to prevent wax buildup are given. Tol. Well.

## 2010-06-29 ENCOUNTER — Ambulatory Visit (INDEPENDENT_AMBULATORY_CARE_PROVIDER_SITE_OTHER)
Admission: RE | Admit: 2010-06-29 | Discharge: 2010-06-29 | Disposition: A | Discharge status: Home / Self Care | Payer: Medicare Other | Source: Ambulatory Visit | Attending: Internal Medicine | Admitting: Internal Medicine

## 2010-06-29 DIAGNOSIS — R51 Headache: Secondary | ICD-10-CM

## 2010-06-29 DIAGNOSIS — R42 Dizziness and giddiness: Secondary | ICD-10-CM

## 2010-06-30 ENCOUNTER — Telehealth: Payer: Self-pay | Admitting: Internal Medicine

## 2010-06-30 NOTE — Telephone Encounter (Signed)
Pt informed copy mailed to pt

## 2010-06-30 NOTE — Telephone Encounter (Signed)
Bruce Ellison, please, inform patient that head CT was nl.

## 2010-07-22 NOTE — Letter (Signed)
Jul 18, 2006    To Whom It May Concern   RE:  SEVAG, SHEARN  MRN:  [id number]  /  DOB:  [birth year]   To whom it may concern:   Mr. Normoyle has been a patient of mine for a number of years.  He was  disabled due to Tourette's syndrome, obsessive-compulsive disorder with  paranoid features, and panic attacks.    Sincerely,      Georgina Quint. Plotnikov, MD  Electronically Signed    AVP/MedQ  DD: 07/18/2006  DT: 07/18/2006  Job #: [id number]

## 2010-08-24 ENCOUNTER — Telehealth: Payer: Self-pay | Admitting: *Deleted

## 2010-08-24 DIAGNOSIS — R42 Dizziness and giddiness: Secondary | ICD-10-CM

## 2010-08-24 NOTE — Telephone Encounter (Signed)
Pt requesting referral to ENT @ [address] [phone]

## 2010-08-26 NOTE — Telephone Encounter (Signed)
ok 

## 2010-08-30 ENCOUNTER — Other Ambulatory Visit: Payer: Self-pay | Admitting: Internal Medicine

## 2010-08-30 ENCOUNTER — Other Ambulatory Visit (INDEPENDENT_AMBULATORY_CARE_PROVIDER_SITE_OTHER): Payer: Medicare Other

## 2010-08-30 ENCOUNTER — Ambulatory Visit (INDEPENDENT_AMBULATORY_CARE_PROVIDER_SITE_OTHER): Payer: Medicare Other | Admitting: Internal Medicine

## 2010-08-30 ENCOUNTER — Encounter: Payer: Self-pay | Admitting: Internal Medicine

## 2010-08-30 DIAGNOSIS — T50905S Adverse effect of unspecified drugs, medicaments and biological substances, sequela: Secondary | ICD-10-CM

## 2010-08-30 DIAGNOSIS — L219 Seborrheic dermatitis, unspecified: Secondary | ICD-10-CM

## 2010-08-30 DIAGNOSIS — R42 Dizziness and giddiness: Secondary | ICD-10-CM

## 2010-08-30 DIAGNOSIS — Z136 Encounter for screening for cardiovascular disorders: Secondary | ICD-10-CM

## 2010-08-30 DIAGNOSIS — J019 Acute sinusitis, unspecified: Secondary | ICD-10-CM

## 2010-08-30 LAB — CBC WITH DIFFERENTIAL/PLATELET
Basophils Absolute: 0.1 10*3/uL (ref 0.0–0.1)
Basophils Relative: 0.8 % (ref 0.0–3.0)
HCT: 50 % (ref 39.0–52.0)
Hemoglobin: 17.3 g/dL — ABNORMAL HIGH (ref 13.0–17.0)
Lymphs Abs: 1.8 10*3/uL (ref 0.7–4.0)
Monocytes Relative: 10.1 % (ref 3.0–12.0)
Neutro Abs: 3.9 10*3/uL (ref 1.4–7.7)
RBC: 5.63 Mil/uL (ref 4.22–5.81)
RDW: 13.6 % (ref 11.5–14.6)

## 2010-08-30 LAB — LIPID PANEL
Cholesterol: 203 mg/dL — ABNORMAL HIGH (ref 0–200)
HDL: 46.8 mg/dL (ref >39.00)
Total CHOL/HDL Ratio: 4
Triglycerides: 81 mg/dL (ref 0.0–149.0)
VLDL: 16.2 mg/dL (ref 0.0–40.0)

## 2010-08-30 LAB — COMPREHENSIVE METABOLIC PANEL
ALT: 55 U/L — ABNORMAL HIGH (ref 0–53)
AST: 33 U/L (ref 0–37)
BUN: 12 mg/dL (ref 6–23)
CO2: 27 mEq/L (ref 19–32)
Creatinine, Ser: 1 mg/dL (ref 0.4–1.5)
GFR: 95.43 mL/min (ref >60.00)
Total Bilirubin: 0.5 mg/dL (ref 0.3–1.2)

## 2010-08-30 MED ORDER — MOXIFLOXACIN HCL 400 MG PO TABS: 400.0000 mg | ORAL_TABLET | Freq: Every day | ORAL | Status: AC

## 2010-08-30 MED ORDER — MECLIZINE HCL 25 MG PO TABS: 12.5000 mg | ORAL_TABLET | Freq: Three times a day (TID) | ORAL | Status: DC | PRN

## 2010-08-30 NOTE — Assessment & Plan Note (Signed)
On Rx 

## 2010-08-30 NOTE — Telephone Encounter (Signed)
Pt advised and will expect a call from [facility] with appt info 

## 2010-08-30 NOTE — Assessment & Plan Note (Signed)
B-D exercise

## 2010-08-30 NOTE — Progress Notes (Signed)
  Subjective:    Patient ID: Bruce Ellison, male    DOB: [birth year], 30 y.o.   MRN: [id number]  HPI  C/o dizziness x 2 wks w/na Subjective:    Patient ID: Bruce Ellison, male    DOB: Nov 25, 1980, 30 y.o.   MRN: [id number]  HPI   C/o dizziness x 2 wks w/nausea. He had some HA C/o nasal d/c - bloody  Review of Systems  Constitutional: Negative for chills. Dizzy HENT: Positive for congestion. Negative for rhinorrhea and postnasal drip.  Bloody nasal d/c Respiratory: Negative for cough, chest tightness and wheezing.   Cardiovascular: Negative for leg swelling.  Musculoskeletal: Negative for gait problem.  Neurological: Negative for speech difficulty.  Psychiatric/Behavioral: Negative for confusion, dysphoric mood and agitation.          Objective:   Physical Exam  Constitutional: He is oriented to person, place, and time. He appears well-developed.       Obese NAD  HENT:  Mouth/Throat: Oropharynx is clear and moist.       R wax   Eyes: Conjunctivae are normal. Pupils are equal, round, and reactive to light.  Neck: Normal range of motion. No JVD present. No thyromegaly present.  Cardiovascular: Normal rate, regular rhythm, normal heart sounds and intact distal pulses.  Exam reveals no gallop and no friction rub.   No murmur heard. Pulmonary/Chest: Effort normal and breath sounds normal. No respiratory distress. He has no wheezes. He has no rales. He exhibits no tenderness.  Abdominal: Soft. Bowel sounds are normal. He exhibits no distension and no mass. There is no tenderness. There is no rebound and no guarding.  Musculoskeletal: Normal range of motion. He exhibits no edema and no tenderness.  Lymphadenopathy:    He has no cervical adenopathy.  Neurological: He is alert and oriented to person, place, and time. He has normal reflexes. No cranial nerve deficit. He exhibits normal muscle tone. Coordination normal.  Skin: Skin is warm and dry. Rash (SD on face) noted.  Psychiatric: He  has a normal mood and affect. His behavior is normal. Judgment and thought content normal. H-P (+) on L          Assessment & Plan:      Review of Systems     Objective:   Physical Exam        Assessment & Plan:

## 2010-08-30 NOTE — Assessment & Plan Note (Signed)
Augmentin x 10 d 

## 2010-08-31 ENCOUNTER — Telehealth: Payer: Self-pay | Admitting: Internal Medicine

## 2010-08-31 ENCOUNTER — Telehealth: Payer: Self-pay | Admitting: *Deleted

## 2010-08-31 ENCOUNTER — Emergency Department (HOSPITAL_COMMUNITY)
Admission: EM | Admit: 2010-08-31 | Discharge: 2010-08-31 | Disposition: A | Discharge status: Home / Self Care | Payer: Medicare Other | Attending: Emergency Medicine | Admitting: Emergency Medicine

## 2010-08-31 DIAGNOSIS — R21 Rash and other nonspecific skin eruption: Secondary | ICD-10-CM | POA: Insufficient documentation

## 2010-08-31 NOTE — Telephone Encounter (Signed)
Please, fax labs to [facility] [phone] Thx

## 2010-08-31 NOTE — Telephone Encounter (Signed)
Pt left vm that he now has rash on his back - has apt w/Dr Felicity Coyer tomorrow. I called pt back to inform him to keep apt for eval of rash. I was told by mother that pt went to ER for eval.

## 2010-08-31 NOTE — Telephone Encounter (Signed)
done

## 2010-08-31 NOTE — Telephone Encounter (Signed)
Noted. Stop avelox. Sarah, please, inform patient that all labs are normal except for  elev Hgb. Does he snore bad? Thx

## 2010-09-01 ENCOUNTER — Ambulatory Visit: Payer: Medicare Other | Admitting: Internal Medicine

## 2010-09-02 NOTE — Telephone Encounter (Signed)
Called pt. Not at home

## 2010-09-03 ENCOUNTER — Encounter: Payer: Self-pay | Admitting: Internal Medicine

## 2010-09-05 NOTE — Telephone Encounter (Signed)
Pt left vm - He can not take meclizine - c/o side effects, says it causes decreased & painful urination. Patient requesting alternative RX for dizziness.

## 2010-09-06 MED ORDER — DIAZEPAM 2 MG PO TABS: 2.0000 mg | ORAL_TABLET | Freq: Two times a day (BID) | ORAL | Status: DC | PRN

## 2010-09-06 NOTE — Telephone Encounter (Signed)
Try Diazepam w/caution

## 2010-09-06 NOTE — Telephone Encounter (Signed)
Pt informed.   Rx phoned in.

## 2010-11-09 ENCOUNTER — Other Ambulatory Visit: Payer: Self-pay | Admitting: Neurology

## 2010-11-09 DIAGNOSIS — F329 Major depressive disorder, single episode, unspecified: Secondary | ICD-10-CM

## 2010-11-09 DIAGNOSIS — R42 Dizziness and giddiness: Secondary | ICD-10-CM

## 2010-11-12 ENCOUNTER — Ambulatory Visit
Admission: RE | Admit: 2010-11-12 | Discharge: 2010-11-12 | Disposition: A | Discharge status: Home / Self Care | Payer: Medicare Other | Source: Ambulatory Visit | Attending: Neurology | Admitting: Neurology

## 2010-11-12 DIAGNOSIS — F329 Major depressive disorder, single episode, unspecified: Secondary | ICD-10-CM

## 2010-11-12 DIAGNOSIS — R42 Dizziness and giddiness: Secondary | ICD-10-CM

## 2010-12-08 ENCOUNTER — Encounter: Payer: Self-pay | Admitting: Internal Medicine

## 2010-12-08 ENCOUNTER — Ambulatory Visit (INDEPENDENT_AMBULATORY_CARE_PROVIDER_SITE_OTHER): Payer: Medicare Other | Admitting: Internal Medicine

## 2010-12-08 ENCOUNTER — Other Ambulatory Visit (INDEPENDENT_AMBULATORY_CARE_PROVIDER_SITE_OTHER): Payer: Medicare Other

## 2010-12-08 VITALS — BP 120/82 | HR 85 | Temp 98.8°F | Resp 16 | Wt 228.0 lb

## 2010-12-08 DIAGNOSIS — R197 Diarrhea, unspecified: Secondary | ICD-10-CM

## 2010-12-08 DIAGNOSIS — A09 Infectious gastroenteritis and colitis, unspecified: Secondary | ICD-10-CM

## 2010-12-08 DIAGNOSIS — R509 Fever, unspecified: Secondary | ICD-10-CM

## 2010-12-08 LAB — COMPREHENSIVE METABOLIC PANEL
AST: 32 U/L (ref 0–37)
Albumin: 4.9 g/dL (ref 3.5–5.2)
BUN: 13 mg/dL (ref 6–23)
Calcium: 9.3 mg/dL (ref 8.4–10.5)
Chloride: 105 mEq/L (ref 96–112)
Glucose, Bld: 100 mg/dL — ABNORMAL HIGH (ref 70–99)
Potassium: 4 mEq/L (ref 3.5–5.1)
Sodium: 140 mEq/L (ref 135–145)
Total Protein: 7.8 g/dL (ref 6.0–8.3)

## 2010-12-08 LAB — CBC WITH DIFFERENTIAL/PLATELET
Basophils Absolute: 0 10*3/uL (ref 0.0–0.1)
Basophils Relative: 0.5 % (ref 0.0–3.0)
Eosinophils Absolute: 0.2 10*3/uL (ref 0.0–0.7)
Lymphocytes Relative: 26.9 % (ref 12.0–46.0)
MCHC: 34.1 g/dL (ref 30.0–36.0)
Neutrophils Relative %: 60.4 % (ref 43.0–77.0)
Platelets: 286 10*3/uL (ref 150.0–400.0)
RBC: 5.56 Mil/uL (ref 4.22–5.81)

## 2010-12-08 LAB — SEDIMENTATION RATE: Sed Rate: 8 mm/hr (ref 0–22)

## 2010-12-08 NOTE — Assessment & Plan Note (Signed)
I think that he has acute viral GE but I have asked him to do stool studies to look for parasitic and bacterial causes

## 2010-12-08 NOTE — Assessment & Plan Note (Signed)
He is afebrile today, I think this fever is part of the AGE but I will check labs to look for other causes

## 2010-12-08 NOTE — Patient Instructions (Signed)
Gastroenteritis (Vomiting, Diarrhea, and/or Dehydration) Viral gastroenteritis is also known as stomach flu. This condition affects the stomach and intestinal tract. The illness typically lasts 3 to 8 days. Most people develop an immune response. This eventually gets rid of the virus. While this natural response develops, the virus can make you quite ill. The majority of those affected are young infants. CAUSES Diarrhea and vomiting are often caused by a virus. Medicines (antibiotics) that kill germs will not help unless there is also a germ (bacterial) infection. SYMPTOMS The most common symptom is diarrhea. This can cause severe loss of fluids (dehydration) and body salt (electrolyte) imbalance. TREATMENT Treatments for this illness are aimed at rehydration. Antidiarrheal medicines are not recommended. They do not decrease diarrhea volume and may be harmful. Usually, home treatment is all that is needed. The most serious cases involve vomiting so severely that you are not able to keep down fluids taken by mouth (orally). In these cases, intravenous (IV) fluids are needed. Vomiting with viral gastroenteritis is common, but it will usually go away with treatment. HOME CARE INSTRUCTIONS Small amounts of fluids should be taken frequently. Large amounts at one time may not be tolerated. Plain water may be harmful in infants and the elderly. Oral rehydration solutions (ORS) are available at pharmacies and grocery stores. ORS replace water and important electrolytes in proper proportions. Sports drinks are not as effective as ORS and may be harmful due to sugars worsening diarrhea.  As a general guideline for children, replace any new fluid losses from diarrhea and/or vomiting with ORS as follows:   If your child weighs 22 pounds or under (10 kg or less), give 60-120 mL (1/4 - 1/2 cup or 2 - 4 ounces) of ORS for each diarrheal stool or vomiting episode.   If your child weighs more than 22 pounds (more  than 10 kgs), give 120-240 mL (1/2 - 1 cup or 4 - 8 ounces) of ORS for each diarrheal stool or vomiting episode.   In a child with vomiting, it may be helpful to give the above ORS replacement in 5 mL (1 teaspoon) amounts every 5 minutes, then increase as tolerated.   While correcting for dehydration, children should eat normally. However, foods high in sugar should be avoided because this may worsen diarrhea. Large amounts of carbonated soft drinks, juice, gelatin desserts, and other highly sugared drinks should be avoided.   After correction of dehydration, other liquids that are appealing to the child may be added. Children should drink small amounts of fluids frequently and fluids should be increased as tolerated.   Adults should eat normally while drinking more fluids than usual. Drink small amounts of fluids frequently and increase as tolerated. Drink enough water and fluids to keep your urine clear or pale yellow. Broths, weak decaffeinated tea, lemon-lime soft drinks (allowed to go flat), and ORS replace fluids and electrolytes.  Avoid:  Carbonated drinks.  Juice.   Extremely hot or cold fluids.   Caffeine drinks.   Fatty, greasy foods.   Alcohol.  Tobacco.   Too much intake of anything at one time.   Gelatin desserts.    Probiotics are active cultures of beneficial bacteria. They may lessen the amount and number of diarrheal stools in adults. Probiotics can be found in yogurt with active cultures and in supplements.   Wash your hands well to avoid spreading bacteria and viruses.   Antidiarrheal medicines are not recommended for infants and children.   Only take over-the-counter or   prescription medicines for pain, discomfort, or fever as directed by your caregiver. Do not give aspirin to children.   For adults with dehydration, ask your caregiver if you should continue all prescribed and over-the-counter medicines.   If your caregiver has given you a follow-up  appointment, it is very important to keep that appointment. Not keeping the appointment could result in a lasting (chronic) or permanent injury and disability. If there is any problem keeping the appointment, you must call to reschedule.  SEEK IMMEDIATE MEDICAL CARE IF:  You are unable to keep fluids down.   There is no urine output in 6 to 8 hours or there is only a small amount of very dark urine.   You develop shortness of breath.   There is blood in the vomit (may look like coffee grounds) or stool.   Belly (abdominal) pain develops, increases, or localizes.   There is persistent vomiting or diarrhea.   You or your child has an oral temperature above 100.5, not controlled by medicine.   Your baby is older than 3 months with a rectal temperature of 102 F (38.9 C) or higher.   Your baby is 3 months old or younger with a rectal temperature of 100.4 F (38 C) or higher.  MAKE SURE YOU:  Understand these instructions.   Will watch your condition.   Will get help right away if you are not doing well or get worse.  Document Released: 02/20/2005 Document Re-Released: 08/10/2009 [employer] Patient Information 2011 [employer]. 

## 2010-12-08 NOTE — Progress Notes (Signed)
  Subjective:    Patient ID: Bruce Ellison, male    DOB: 07-31-1980, 30 y.o.   MRN: [id number]  Fever  This is a new problem. The current episode started 1 to 4 weeks ago. The problem occurs intermittently. The problem has been unchanged. The maximum temperature noted was 100 to 100.9 F. Associated symptoms include diarrhea and nausea. Pertinent negatives include no abdominal pain, chest pain, congestion, coughing, ear pain, headaches, muscle aches, rash, sleepiness, sore throat, urinary pain, vomiting or wheezing. He has tried nothing for the symptoms.  Diarrhea  This is a new problem. The current episode started 1 to 4 weeks ago. The problem occurs 2 to 4 times per day. The problem has been gradually improving. The stool consistency is described as watery. Associated symptoms include a fever and increased flatus. Pertinent negatives include no abdominal pain, arthralgias, bloating, chills, coughing, headaches, myalgias, sweats, URI, vomiting or weight loss. The symptoms are aggravated by nothing. Risk factors include no known risk factors. He has tried nothing for the symptoms.      Review of Systems  Constitutional: Positive for fever and fatigue. Negative for chills, weight loss, diaphoresis, activity change, appetite change and unexpected weight change.  HENT: Negative for ear pain, congestion, sore throat and trouble swallowing.   Eyes: Negative.   Respiratory: Negative.  Negative for cough and wheezing.   Cardiovascular: Negative.  Negative for chest pain, palpitations and leg swelling.  Gastrointestinal: Positive for nausea, diarrhea and flatus. Negative for vomiting, abdominal pain, constipation, blood in stool, abdominal distention, anal bleeding, rectal pain and bloating.  Genitourinary: Negative for dysuria, urgency, frequency, hematuria, flank pain, decreased urine volume, enuresis and difficulty urinating.  Musculoskeletal: Negative for myalgias, back pain, joint swelling, arthralgias  and gait problem.  Skin: Negative for color change, pallor, rash and wound.  Neurological: Negative for dizziness, tremors, seizures, syncope, facial asymmetry, speech difficulty, weakness, light-headedness, numbness and headaches.  Hematological: Negative for adenopathy. Does not bruise/bleed easily.  Psychiatric/Behavioral: Negative.        Objective:   Physical Exam  Vitals reviewed. Constitutional: He is oriented to person, place, and time. He appears well-developed and well-nourished. No distress.  HENT:  Mouth/Throat: Oropharynx is clear and moist. No oropharyngeal exudate.  Eyes: Conjunctivae are normal. Right eye exhibits no discharge. Left eye exhibits no discharge. No scleral icterus.  Neck: Normal range of motion. Neck supple. No JVD present. No tracheal deviation present. No thyromegaly present.  Cardiovascular: Normal rate, regular rhythm, normal heart sounds and intact distal pulses.  Exam reveals no gallop and no friction rub.   No murmur heard. Pulmonary/Chest: Effort normal and breath sounds normal. No stridor. No respiratory distress. He has no wheezes. He has no rales. He exhibits no tenderness.  Abdominal: Soft. Bowel sounds are normal. He exhibits no distension and no mass. There is no tenderness. There is no rebound and no guarding.  Musculoskeletal: Normal range of motion. He exhibits no edema and no tenderness.  Lymphadenopathy:    He has no cervical adenopathy.  Neurological: He is oriented to person, place, and time. He displays normal reflexes. No cranial nerve deficit. He exhibits normal muscle tone. Coordination normal.  Skin: Skin is warm and dry. No rash noted. He is not diaphoretic. No erythema. No pallor.  Psychiatric: He has a normal mood and affect. His behavior is normal. Judgment and thought content normal.          Assessment & Plan:

## 2010-12-09 ENCOUNTER — Ambulatory Visit: Payer: Medicare Other

## 2010-12-09 DIAGNOSIS — A09 Infectious gastroenteritis and colitis, unspecified: Secondary | ICD-10-CM

## 2010-12-09 DIAGNOSIS — R509 Fever, unspecified: Secondary | ICD-10-CM

## 2010-12-12 LAB — OVA AND PARASITE SCREEN: OP: NONE SEEN

## 2010-12-13 ENCOUNTER — Telehealth: Payer: Self-pay | Admitting: *Deleted

## 2010-12-13 NOTE — Telephone Encounter (Signed)
Left vm for patient

## 2010-12-13 NOTE — Telephone Encounter (Signed)
Patient continues to c/o fever and wants to know what is safe to take otc.

## 2010-12-13 NOTE — Telephone Encounter (Signed)
Patient requesting to know WHAT is ok to take OTC for fever

## 2010-12-13 NOTE — Telephone Encounter (Signed)
tylenol

## 2010-12-13 NOTE — Telephone Encounter (Signed)
yes

## 2010-12-14 ENCOUNTER — Ambulatory Visit (INDEPENDENT_AMBULATORY_CARE_PROVIDER_SITE_OTHER): Payer: Medicare Other | Admitting: Internal Medicine

## 2010-12-14 ENCOUNTER — Encounter: Payer: Self-pay | Admitting: Internal Medicine

## 2010-12-14 VITALS — BP 132/88 | HR 92 | Temp 99.6°F | Resp 16 | Wt 230.0 lb

## 2010-12-14 DIAGNOSIS — D751 Secondary polycythemia: Secondary | ICD-10-CM

## 2010-12-14 DIAGNOSIS — R509 Fever, unspecified: Secondary | ICD-10-CM

## 2010-12-14 DIAGNOSIS — R197 Diarrhea, unspecified: Secondary | ICD-10-CM

## 2010-12-14 MED ORDER — METRONIDAZOLE 250 MG PO TABS: 250.0000 mg | ORAL_TABLET | Freq: Three times a day (TID) | ORAL | Status: DC

## 2010-12-14 NOTE — Assessment & Plan Note (Signed)
GI cons 

## 2010-12-14 NOTE — Assessment & Plan Note (Addendum)
GI consult R/o IBD Empiric Flagyl

## 2010-12-14 NOTE — Progress Notes (Signed)
  Subjective:    Patient ID: Bruce Ellison, male    DOB: [birth year], 30 y.o.   MRN: [id number]  HPI  C/o fever and diarrhea x 3 wks - not better  Review of Systems  Constitutional: Positive for fever and chills. Negative for appetite change, fatigue and unexpected weight change.  HENT: Negative for nosebleeds, congestion, sore throat, sneezing, trouble swallowing and neck pain.   Eyes: Negative for itching and visual disturbance.  Respiratory: Negative for cough.   Cardiovascular: Negative for chest pain, palpitations and leg swelling.  Gastrointestinal: Positive for diarrhea. Negative for nausea, blood in stool, abdominal distention and anal bleeding.  Genitourinary: Negative for frequency and hematuria.  Musculoskeletal: Positive for arthralgias. Negative for back pain, joint swelling and gait problem.  Skin: Negative for rash.  Neurological: Negative for dizziness, tremors, speech difficulty and weakness.  Psychiatric/Behavioral: Negative for suicidal ideas, sleep disturbance, dysphoric mood and agitation. The patient is not nervous/anxious.        Objective:   Physical Exam  Constitutional: He is oriented to person, place, and time. He appears well-developed.       obese  HENT:  Mouth/Throat: Oropharynx is clear and moist.  Eyes: Conjunctivae are normal. Pupils are equal, round, and reactive to light.  Neck: Normal range of motion. No JVD present. No thyromegaly present.  Cardiovascular: Normal rate, regular rhythm, normal heart sounds and intact distal pulses.  Exam reveals no gallop and no friction rub.   No murmur heard. Pulmonary/Chest: Effort normal and breath sounds normal. No respiratory distress. He has no wheezes. He has no rales. He exhibits no tenderness.  Abdominal: Soft. Bowel sounds are normal. He exhibits no distension and no mass. There is no tenderness. There is no rebound and no guarding.  Musculoskeletal: Normal range of motion. He exhibits no edema and no  tenderness.  Lymphadenopathy:    He has no cervical adenopathy.  Neurological: He is alert and oriented to person, place, and time. He has normal reflexes. No cranial nerve deficit. He exhibits normal muscle tone. Coordination normal.  Skin: Skin is warm and dry. No rash noted.  Psychiatric: He has a normal mood and affect. His behavior is normal. Judgment and thought content normal.      Lab Results  Component Value Date   WBC 6.4 12/08/2010   HGB 17.1* 12/08/2010   HCT 50.0 12/08/2010   PLT 286.0 12/08/2010   GLUCOSE 100* 12/08/2010   CHOL 203* 08/30/2010   TRIG 81.0 08/30/2010   HDL 46.80 08/30/2010   LDLDIRECT 136.9 08/30/2010   LDLCALC 130* 05/02/2006   ALT 46 12/08/2010   AST 32 12/08/2010   NA 140 12/08/2010   K 4.0 12/08/2010   CL 105 12/08/2010   CREATININE 0.9 12/08/2010   BUN 13 12/08/2010   CO2 27 12/08/2010   TSH 0.82 01/14/2010   HGBA1C 5.5 07/23/2008       Assessment & Plan:

## 2010-12-14 NOTE — Assessment & Plan Note (Signed)
Denies OSA Will monitor

## 2010-12-17 LAB — CLOSTRIDIUM DIFFICILE CULTURE-FECAL

## 2010-12-26 ENCOUNTER — Encounter: Payer: Self-pay | Admitting: Internal Medicine

## 2010-12-26 ENCOUNTER — Ambulatory Visit (INDEPENDENT_AMBULATORY_CARE_PROVIDER_SITE_OTHER): Payer: Medicare Other | Admitting: Internal Medicine

## 2010-12-26 VITALS — BP 142/92 | HR 92 | Temp 98.6°F | Resp 16 | Wt 228.0 lb

## 2010-12-26 DIAGNOSIS — R509 Fever, unspecified: Secondary | ICD-10-CM

## 2010-12-26 DIAGNOSIS — D751 Secondary polycythemia: Secondary | ICD-10-CM

## 2010-12-26 DIAGNOSIS — R197 Diarrhea, unspecified: Secondary | ICD-10-CM

## 2010-12-26 MED ORDER — METRONIDAZOLE 250 MG PO TABS: 250.0000 mg | ORAL_TABLET | Freq: Three times a day (TID) | ORAL | Status: DC

## 2010-12-26 NOTE — Assessment & Plan Note (Signed)
Restart flagyl

## 2010-12-26 NOTE — Progress Notes (Signed)
  Subjective:    Patient ID: Bruce Ellison, male    DOB: [birth year], 30 y.o.   MRN: [id number]  HPI  C/o fever and abd complaints and diarrhea relapsed after he finished Flagyl: there were no sx's on Flagyl. F/u snoring and abn Hgb. Mom said he stops breathing at times  Review of Systems  Constitutional: Positive for fever (T 99). Negative for appetite change, fatigue and unexpected weight change.  HENT: Negative for nosebleeds, congestion, sore throat, sneezing, trouble swallowing and neck pain.   Eyes: Negative for itching and visual disturbance.  Respiratory: Negative for cough.   Cardiovascular: Negative for chest pain, palpitations and leg swelling.  Gastrointestinal: Negative for nausea, diarrhea, blood in stool and abdominal distention.  Genitourinary: Negative for frequency and hematuria.  Musculoskeletal: Negative for back pain, joint swelling and gait problem.  Skin: Negative for rash.  Neurological: Negative for dizziness, tremors, speech difficulty and weakness.  Psychiatric/Behavioral: Negative for sleep disturbance, dysphoric mood and agitation. The patient is not nervous/anxious.        Objective:   Physical Exam  Constitutional: He is oriented to person, place, and time. He appears well-developed.  HENT:  Mouth/Throat: Oropharynx is clear and moist.  Eyes: Conjunctivae are normal. Pupils are equal, round, and reactive to light.  Neck: Normal range of motion. No JVD present. No thyromegaly present.  Cardiovascular: Normal rate, regular rhythm, normal heart sounds and intact distal pulses.  Exam reveals no gallop and no friction rub.   No murmur heard. Pulmonary/Chest: Effort normal and breath sounds normal. No respiratory distress. He has no wheezes. He has no rales. He exhibits no tenderness.  Abdominal: Soft. Bowel sounds are normal. He exhibits no distension and no mass. There is no tenderness. There is no rebound and no guarding.  Musculoskeletal: Normal range of  motion. He exhibits no edema and no tenderness.  Lymphadenopathy:    He has no cervical adenopathy.  Neurological: He is alert and oriented to person, place, and time. He has normal reflexes. No cranial nerve deficit. He exhibits normal muscle tone. Coordination normal.  Skin: Skin is warm and dry. No rash noted.  Psychiatric: He has a normal mood and affect. His behavior is normal. Judgment and thought content normal.          Assessment & Plan:

## 2010-12-26 NOTE — Assessment & Plan Note (Signed)
GI cons pending 

## 2010-12-26 NOTE — Assessment & Plan Note (Signed)
Sleep test 

## 2010-12-28 ENCOUNTER — Telehealth: Payer: Self-pay | Admitting: *Deleted

## 2010-12-28 DIAGNOSIS — D751 Secondary polycythemia: Secondary | ICD-10-CM

## 2010-12-28 DIAGNOSIS — R0683 Snoring: Secondary | ICD-10-CM

## 2010-12-28 NOTE — Telephone Encounter (Signed)
Order needed for Sleep study

## 2010-12-30 ENCOUNTER — Encounter: Payer: Self-pay | Admitting: Internal Medicine

## 2011-01-02 ENCOUNTER — Encounter: Payer: Self-pay | Admitting: Internal Medicine

## 2011-01-05 ENCOUNTER — Ambulatory Visit: Payer: Medicare Other | Admitting: Internal Medicine

## 2011-01-05 ENCOUNTER — Telehealth: Payer: Self-pay | Admitting: *Deleted

## 2011-01-05 NOTE — Telephone Encounter (Signed)
Pt left vm requesting 06/2010 CT results be mailed to him. Copy mailed today.

## 2011-01-16 ENCOUNTER — Encounter (HOSPITAL_BASED_OUTPATIENT_CLINIC_OR_DEPARTMENT_OTHER): Payer: Self-pay

## 2011-01-16 ENCOUNTER — Ambulatory Visit (HOSPITAL_BASED_OUTPATIENT_CLINIC_OR_DEPARTMENT_OTHER): Payer: Medicare Other | Attending: Internal Medicine | Admitting: Radiology

## 2011-01-16 VITALS — Ht 67.0 in | Wt 225.0 lb

## 2011-01-16 DIAGNOSIS — R0989 Other specified symptoms and signs involving the circulatory and respiratory systems: Secondary | ICD-10-CM | POA: Insufficient documentation

## 2011-01-16 DIAGNOSIS — R0683 Snoring: Secondary | ICD-10-CM

## 2011-01-16 DIAGNOSIS — R0609 Other forms of dyspnea: Secondary | ICD-10-CM | POA: Insufficient documentation

## 2011-01-16 DIAGNOSIS — D751 Secondary polycythemia: Secondary | ICD-10-CM

## 2011-01-16 DIAGNOSIS — G4733 Obstructive sleep apnea (adult) (pediatric): Secondary | ICD-10-CM | POA: Insufficient documentation

## 2011-01-24 ENCOUNTER — Encounter: Payer: Self-pay | Admitting: Internal Medicine

## 2011-01-24 DIAGNOSIS — G4733 Obstructive sleep apnea (adult) (pediatric): Secondary | ICD-10-CM

## 2011-01-25 NOTE — Procedures (Signed)
Bruce Ellison, WILLETTS NO.:  [id number]  MEDICAL RECORD NO.:  [id number]          PATIENT TYPE:  OUT  LOCATION:  SLEEP CENTER                 FACILITY:  [facility]  PHYSICIAN:  Barbaraann Share, MD,FCCPDATE OF BIRTH:  06/15/80  DATE OF STUDY:  01/16/2011                           NOCTURNAL POLYSOMNOGRAM  REFERRING PHYSICIAN:  ALEX PLOTNIKOV  REFERRING PHYSICIAN:  Georgina Quint. Plotnikov, MD  INDICATION FOR STUDY:  Hypersomnia with sleep apnea.  EPWORTH SCORE:  6.  SLEEP ARCHITECTURE:  The patient had a total sleep time of only 38 minutes with no slow wave sleep or REM being achieved.  Sleep onset latency was prolonged at 100 minutes and sleep efficiency was extremely poor at 11%.  RESPIRATORY DATA:  The patient was noted to have 9 obstructive hypopneas and no apneas for an apnea-hypopnea index of 14 events per hour.  The events occurred in all body positions, and there was mild-to-moderate snoring noted throughout.  OXYGEN DATA:  There was O2 desaturation as low as 90% with his obstructive events.  CARDIAC DATA:  No clinically significant arrhythmias were noted.  MOVEMENT/PARASOMNIA:  The patient had no significant leg jerks or other abnormal behavior seen.  IMPRESSION/RECOMMENDATION:  Mild obstructive sleep apnea/hypopnea syndrome with an AHI of 14 events per hour and O2 desaturation as low as 90% during the night.  However, the patient had a total sleep time of only 38 minutes, and I suspect that his sleep apnea may be worse than indicated on this study because of his low total sleep time and lack of REM.  Would recommend either returning to the Sleep Center for a repeat study with a sedative hypnotic to help with sleep onset, versus doing a home sleep test in a more familiar and comfortable environment.  Clinical correlation is suggested.  The patient should also be encouraged to work aggressively on weight loss.     Barbaraann Share,  MD,FCCP Diplomate, American Board of Sleep Medicine Electronically Signed    [facility]  D:  01/25/2011 08:37:45  T:  01/25/2011 09:30:14  Job:  [id number]

## 2011-01-30 ENCOUNTER — Other Ambulatory Visit (INDEPENDENT_AMBULATORY_CARE_PROVIDER_SITE_OTHER): Payer: Medicare Other

## 2011-01-30 ENCOUNTER — Ambulatory Visit (INDEPENDENT_AMBULATORY_CARE_PROVIDER_SITE_OTHER): Payer: Medicare Other | Admitting: Internal Medicine

## 2011-01-30 ENCOUNTER — Encounter: Payer: Self-pay | Admitting: Internal Medicine

## 2011-01-30 DIAGNOSIS — R197 Diarrhea, unspecified: Secondary | ICD-10-CM

## 2011-01-30 DIAGNOSIS — R198 Other specified symptoms and signs involving the digestive system and abdomen: Secondary | ICD-10-CM

## 2011-01-30 DIAGNOSIS — R194 Change in bowel habit: Secondary | ICD-10-CM

## 2011-01-30 LAB — CBC WITH DIFFERENTIAL/PLATELET
Basophils Relative: 0.7 % (ref 0.0–3.0)
Eosinophils Relative: 3.4 % (ref 0.0–5.0)
HCT: 49.7 % (ref 39.0–52.0)
Hemoglobin: 17.2 g/dL — ABNORMAL HIGH (ref 13.0–17.0)
Lymphs Abs: 1.2 10*3/uL (ref 0.7–4.0)
Monocytes Relative: 10.9 % (ref 3.0–12.0)
Neutro Abs: 3 10*3/uL (ref 1.4–7.7)
RBC: 5.53 Mil/uL (ref 4.22–5.81)
WBC: 4.9 10*3/uL (ref 4.5–10.5)

## 2011-01-30 LAB — TSH: TSH: 0.86 u[IU]/mL (ref 0.35–5.50)

## 2011-01-30 MED ORDER — LOPERAMIDE HCL 2 MG PO TABS: 2.0000 mg | ORAL_TABLET | Freq: Four times a day (QID) | ORAL | Status: DC | PRN

## 2011-01-30 MED ORDER — ESOMEPRAZOLE MAGNESIUM 40 MG PO CPDR: 40.0000 mg | DELAYED_RELEASE_CAPSULE | Freq: Every day | ORAL | Status: DC

## 2011-01-30 NOTE — Patient Instructions (Addendum)
Your physician has requested that you go to the basement for lab work before leaving today.  We have sent the following medications to your pharmacy for you to pick up at your convenience: Nexium and Loperamide.   Dr. Rhea Belton recommends you to have a colonoscopy done. Please call back in January to schedule one.  [phone]

## 2011-01-31 ENCOUNTER — Encounter: Payer: Self-pay | Admitting: Internal Medicine

## 2011-01-31 ENCOUNTER — Telehealth: Payer: Self-pay | Admitting: *Deleted

## 2011-01-31 LAB — CELIAC PANEL 10
Endomysial Screen: NEGATIVE
IgA: 286 mg/dL (ref 68–379)
Tissue Transglutaminase Ab, IgA: 6.3 U/mL (ref <20)

## 2011-01-31 NOTE — Progress Notes (Signed)
Subjective:    Patient ID: Bruce Ellison, male    DOB: [birth year], 30 y.o.   MRN: [id number]  HPI Bruce Ellison is a 30 yo male with PMH of OCD, ADD, Tourette's, GERD, anxiety and depression, impaired glucose tolerance who is seen at the request of Dr. Posey Rea for evaluation of diarrhea.  The patient states that he developed loose watery diarrhea about 3 months ago. This was a change in his bowel habits as prior this he was having one to 2 formed stools daily. He describes his stool as loose and watery occurring usually 2-3 times in the morning and then occasionally once or twice more in the evening. He denies nocturnal stools. He denies melena or rectal bleeding. He does not think the diarrhea would continue with fasting. He notes some mild lower abdominal pain which is usually relieved with bowel movement. He denies urgency, tenesmus, and rectal pain. He does note increased borborygmi. He does feel abdominal bloating and increased gas especially after eating. He reports some nausea, but no vomiting. His weight has been stable and his appetite remains good. He denies heartburn, dysphagia and odynophagia. He's had no recent antibiotic exposures. He does report fevers on and off for 2 months but none higher than 100F. He reports he's been low-grade ranging from 99.3-99.9 F.  He reports he did try Align for 14 days and found no improvement. He has not tried over-the-counter antidiarrheals. He has been treated with metronidazole for the diarrhea, which he felt helped significantly at first. His last course however did not help and thus he stopped his medication.  He notes no changes in chronic medications, other than venlafaxine which was started 2 weeks ago and is newer than his current symptoms.  Review of Systems Constitutional: See history of present illness, positive for fatigue HEENT: Negative for sore throat, mouth sores and trouble swallowing, notes history of nose bleeding Eyes: Negative for visual  disturbance Respiratory: Negative for cough, chest tightness and shortness of breath Cardiovascular: Negative for chest pain, palpitations and lower extremity swelling Gastrointestinal: See history of present illness Genitourinary: Negative for dysuria and hematuria. Musculoskeletal: Positive for back pain, negative for other arthralgias and myalgias Skin: Negative for rash or color change Neurological: Negative for headaches, weakness, numbness Hematological: Negative for adenopathy, negative for easy bruising/bleeding Psychiatric/behavioral: Positive for history of depressed mood, positive for history anxiety   Past Medical History  Diagnosis Date  . Anxiety   . OCD (obsessive compulsive disorder)   . ADD (attention deficit disorder with hyperactivity)     oppositional defiant disorder of adolescence  . Personality disorder     with schizoid features  . Tourette's     per [facility]  . Insomnia   . HTN (hypertension)     borderline  . Obesity   . Allergic rhinitis   . Glucose intolerance (impaired glucose tolerance)   . GERD (gastroesophageal reflux disease)   . Vitamin D deficiency   . Depression    Past Surgical History  Procedure Date  . Adnoidectomy    Current Outpatient Prescriptions  Medication Sig Dispense Refill  . ARIPiprazole (ABILIFY) 10 MG tablet Take 10 mg by mouth at bedtime.        [name] desonide (DESOWEN) 0.05 % lotion Apply 1 application topically as needed.        [name] ketoconazole (NIZORAL) 2 % shampoo Apply 1 application topically 2 (two) times a week. On scalp and face.       . mirtazapine (REMERON)  45 MG tablet Take 45 mg by mouth at bedtime.        [name] venlafaxine (EFFEXOR) 37.5 MG tablet Take 37.5 mg by mouth daily.        [name] esomeprazole (NEXIUM) 40 MG capsule Take 1 capsule (40 mg total) by mouth daily.  30 capsule  3  . loperamide (LOPERAMIDE A-D) 2 MG tablet Take 1 tablet (2 mg total) by mouth 4 (four) times daily as needed for diarrhea/loose stools.   30 tablet  1  . metroNIDAZOLE (FLAGYL) 250 MG tablet Take 1 tablet (250 mg total) by mouth 3 (three) times daily.  21 tablet  0  . metroNIDAZOLE (FLAGYL) 250 MG tablet Take 1 tablet (250 mg total) by mouth 3 (three) times daily.  90 tablet  1   Allergies  Allergen Reactions  . Meclizine     Urinary sx's  . Penicillins   . Quetiapine    Family History  Problem Relation Age of Onset  . Cholelithiasis Maternal Grandmother   . Diabetes Maternal Grandmother   . Prostate cancer Maternal Grandfather   . Colon polyps Maternal Grandfather    Social History  . Marital Status: Single   Occupational History  . unemployed    Social History Main Topics  . Smoking status: Never Smoker   . Smokeless tobacco: Never Used  . Alcohol Use: No  . Drug Use: No  . Sexually Active: Not Currently      Objective:   Physical Exam BP 124/80  Pulse 72  Temp(Src) 98.8 F (37.1 C) (Oral)  Ht 5\' 7"  (1.702 m)  Wt 102.694 kg (226 lb 6.4 oz)  BMI 35.46 kg/m2 Constitutional: Well-developed and well-nourished. No distress. HEENT: Normocephalic and atraumatic. Oropharynx is clear and moist. No oropharyngeal exudate. Conjunctivae are normal. Pupils are equal round and reactive to light. No scleral icterus. Neck: Neck supple. Trachea midline. Cardiovascular: Normal rate, regular rhythm and intact distal pulses. No M/R/G Pulmonary/chest: Effort normal and breath sounds normal. No wheezing, rales or rhonchi. Abdominal: Soft, nontender, nondistended. Bowel sounds active throughout. There are no masses palpable. No hepatosplenomegaly. Extremities: no clubbing, cyanosis, or edema Lymphadenopathy: No cervical adenopathy noted. Neurological: Alert and oriented to person place and time. Skin: Skin is warm and dry. No rashes noted. Psychiatric: Normal mood and affect. Behavior is normal.  Stool studies obtained 12/17/2010: Negative for ova and parasite, negative for C. difficile toxin  CBC    Component Value  Date/Time   WBC 4.9 01/30/2011 1024   RBC 5.53 01/30/2011 1024   HGB 17.2* 01/30/2011 1024   HCT 49.7 01/30/2011 1024   PLT 272.0 01/30/2011 1024   MCV 89.8 01/30/2011 1024   MCHC 34.6 01/30/2011 1024   RDW 13.5 01/30/2011 1024   LYMPHSABS 1.2 01/30/2011 1024   MONOABS 0.5 01/30/2011 1024   EOSABS 0.2 01/30/2011 1024   BASOSABS 0.0 01/30/2011 1024    CMP     Component Value Date/Time   NA 140 12/08/2010 1333   K 4.0 12/08/2010 1333   CL 105 12/08/2010 1333   CO2 27 12/08/2010 1333   GLUCOSE 100* 12/08/2010 1333   BUN 13 12/08/2010 1333   CREATININE 0.9 12/08/2010 1333   CALCIUM 9.3 12/08/2010 1333   PROT 7.8 12/08/2010 1333   ALBUMIN 4.9 12/08/2010 1333   AST 32 12/08/2010 1333   ALT 46 12/08/2010 1333   ALKPHOS 60 12/08/2010 1333   BILITOT 1.0 12/08/2010 1333   GFRNONAA 84.76 01/14/2010 1551   GFRAA 117 05/02/2006  1610       Assessment & Plan:  30 yo male with PMH of OCD, ADD, Tourette's, GERD, anxiety and depression, impaired glucose tolerance who is seen at the request of Dr. Posey Rea for evaluation of loose stools/change in bowel habits  1. Change in bowel habits/loose stool -- the patient's symptoms have been ongoing for approximately 3 months. C. difficile and ova and parasite infection have been ruled out. I do not see that bacterial stool culture was performed, however this is unlikely given the chronicity. The differential includes irritable bowel syndrome, inflammatory bowel disease, celiac disease, and small bowel bacterial overgrowth. Given the chronic nature and lack of explanation, we will proceed with colonoscopy. Also order labs today to include CBC, celiac panel, and TSH. I have advised that he give a trial of loperamide 2 mg after his first loose stool of the day. He can use additional doses as needed after additional loose bowel movements.  I also will give a trial of PPI therapy given his mild upper symptoms. Further recommendations to be made after colonoscopy. If the  colon is normal I will perform random biopsies.  Return to be determined after colonoscopy

## 2011-01-31 NOTE — Telephone Encounter (Signed)
Notified pt his labs are unremarkable and his Celiac panel is negative; pt stated understanding.

## 2011-01-31 NOTE — Telephone Encounter (Signed)
Message copied by Florene Glen on Tue Jan 31, 2011  4:03 PM ------      Message from: Beverley Fiedler      Created: Tue Jan 31, 2011  1:12 PM       Labs reviewed.  Unremarkable.  Celiac panel neg, one component slightly high, but in total a negative result.

## 2011-02-01 ENCOUNTER — Telehealth: Payer: Self-pay | Admitting: Pulmonary Disease

## 2011-02-01 NOTE — Telephone Encounter (Signed)
Megan, please set this pt up for a sleep consult to see me.  Dr Posey Rea is supposed to be putting in a referral. Thanks.

## 2011-02-02 ENCOUNTER — Encounter (HOSPITAL_COMMUNITY): Payer: Self-pay | Admitting: Adult Health

## 2011-02-02 ENCOUNTER — Telehealth: Payer: Self-pay

## 2011-02-02 ENCOUNTER — Ambulatory Visit (INDEPENDENT_AMBULATORY_CARE_PROVIDER_SITE_OTHER): Payer: Medicare Other | Admitting: Internal Medicine

## 2011-02-02 ENCOUNTER — Ambulatory Visit (INDEPENDENT_AMBULATORY_CARE_PROVIDER_SITE_OTHER)
Admission: RE | Admit: 2011-02-02 | Discharge: 2011-02-02 | Disposition: A | Discharge status: Home / Self Care | Payer: Medicare Other | Source: Ambulatory Visit | Attending: Internal Medicine | Admitting: Internal Medicine

## 2011-02-02 ENCOUNTER — Emergency Department (HOSPITAL_COMMUNITY)
Admission: EM | Admit: 2011-02-02 | Discharge: 2011-02-02 | Disposition: A | Discharge status: Home / Self Care | Payer: Medicare Other | Attending: Emergency Medicine | Admitting: Emergency Medicine

## 2011-02-02 ENCOUNTER — Encounter: Payer: Self-pay | Admitting: Internal Medicine

## 2011-02-02 VITALS — BP 112/64 | HR 127 | Temp 102.2°F | Wt 226.0 lb

## 2011-02-02 DIAGNOSIS — J111 Influenza due to unidentified influenza virus with other respiratory manifestations: Secondary | ICD-10-CM | POA: Insufficient documentation

## 2011-02-02 DIAGNOSIS — R509 Fever, unspecified: Secondary | ICD-10-CM | POA: Insufficient documentation

## 2011-02-02 DIAGNOSIS — R05 Cough: Secondary | ICD-10-CM

## 2011-02-02 DIAGNOSIS — F988 Other specified behavioral and emotional disorders with onset usually occurring in childhood and adolescence: Secondary | ICD-10-CM | POA: Insufficient documentation

## 2011-02-02 DIAGNOSIS — Z79899 Other long term (current) drug therapy: Secondary | ICD-10-CM | POA: Insufficient documentation

## 2011-02-02 DIAGNOSIS — J9819 Other pulmonary collapse: Secondary | ICD-10-CM | POA: Insufficient documentation

## 2011-02-02 DIAGNOSIS — F952 Tourette's disorder: Secondary | ICD-10-CM | POA: Insufficient documentation

## 2011-02-02 DIAGNOSIS — J209 Acute bronchitis, unspecified: Secondary | ICD-10-CM

## 2011-02-02 MED ORDER — AZITHROMYCIN 500 MG PO TABS: 500.0000 mg | ORAL_TABLET | Freq: Every day | ORAL | Status: AC

## 2011-02-02 MED ORDER — CHLORPHENIRAMINE-HYDROCODONE 8-10 MG/5ML PO LQCR: 5.0000 mL | Freq: Two times a day (BID) | ORAL | Status: DC | PRN

## 2011-02-02 NOTE — Telephone Encounter (Signed)
Patient mother called stating that he was seen today for flu and prescribed medication. Patient fever has increased to 104 since leaving office, last dose of ibuprofen was 4am. Patient mother is requesting advisement Thanks

## 2011-02-02 NOTE — Assessment & Plan Note (Signed)
Start zpak for the infection and tussionex for the cough

## 2011-02-02 NOTE — Assessment & Plan Note (Signed)
He has presented too late to benefit from anti-viral meds

## 2011-02-02 NOTE — ED Notes (Signed)
Pt states [facility] about fever of 102 and sent home with a dx of swine flu, xray done at office and clean. Pt then went home and took temp and fever was 104.0, he called EMS and took ibuprofen. Fever 100.0, c/o headache, sorethroat and congestion, dizziness and headache.

## 2011-02-02 NOTE — Progress Notes (Signed)
Subjective:    Patient ID: Bruce Ellison, male    DOB: Aug 10, 1980, 30 y.o.   MRN: [id number]  Cough This is a new problem. Episode onset: 3 days ago. The problem has been unchanged. The problem occurs every few minutes. The cough is productive of purulent sputum. Associated symptoms include chills, a fever, headaches, myalgias, rhinorrhea, a sore throat and sweats. Pertinent negatives include no chest pain, ear congestion, heartburn, hemoptysis, nasal congestion, postnasal drip, rash, shortness of breath, weight loss or wheezing. The symptoms are aggravated by nothing. Risk factors for lung disease include travel. He has tried nothing for the symptoms.      Review of Systems  Constitutional: Positive for fever, chills and fatigue. Negative for weight loss, diaphoresis, activity change, appetite change and unexpected weight change.  HENT: Positive for sore throat and rhinorrhea. Negative for congestion, facial swelling, mouth sores, trouble swallowing, neck pain, neck stiffness, voice change, postnasal drip and sinus pressure.   Eyes: Negative.   Respiratory: Negative for cough, hemoptysis, shortness of breath, wheezing and stridor.   Cardiovascular: Negative for chest pain, palpitations and leg swelling.  Gastrointestinal: Negative for heartburn, nausea, vomiting, abdominal pain, diarrhea and constipation.  Genitourinary: Negative for dysuria, urgency, decreased urine volume, enuresis and difficulty urinating.  Musculoskeletal: Positive for myalgias. Negative for back pain, joint swelling, arthralgias and gait problem.  Skin: Negative for color change, pallor, rash and wound.  Neurological: Positive for dizziness and headaches. Negative for tremors, seizures, syncope, facial asymmetry, speech difficulty, weakness, light-headedness and numbness.  Hematological: Negative for adenopathy. Does not bruise/bleed easily.  Psychiatric/Behavioral: Negative.        Objective:   Physical Exam  Vitals  reviewed. Constitutional: He is oriented to person, place, and time. He appears well-developed and well-nourished. No distress.  HENT:  Head: No trismus in the jaw.  Right Ear: Hearing, tympanic membrane, external ear and ear canal normal.  Left Ear: Hearing, tympanic membrane, external ear and ear canal normal.  Nose: Nose normal. No mucosal edema, rhinorrhea, nose lacerations, sinus tenderness, nasal deformity, septal deviation or nasal septal hematoma. No epistaxis.  No foreign bodies. Right sinus exhibits no maxillary sinus tenderness and no frontal sinus tenderness. Left sinus exhibits no maxillary sinus tenderness and no frontal sinus tenderness.  Mouth/Throat: Mucous membranes are normal. Mucous membranes are not pale, not dry and not cyanotic. No uvula swelling. Posterior oropharyngeal erythema present. No oropharyngeal exudate, posterior oropharyngeal edema or tonsillar abscesses.  Eyes: Conjunctivae are normal. Right eye exhibits no discharge. Left eye exhibits no discharge. No scleral icterus.  Neck: Normal range of motion. Neck supple. No JVD present. No tracheal deviation present. No thyromegaly present.  Cardiovascular: Normal rate, regular rhythm, normal heart sounds and intact distal pulses.  Exam reveals no gallop and no friction rub.   No murmur heard. Pulmonary/Chest: Effort normal and breath sounds normal. No stridor. No respiratory distress. He has no wheezes. He has no rales. He exhibits no tenderness.  Abdominal: Soft. Bowel sounds are normal. He exhibits no distension. There is no tenderness. There is no rebound and no guarding.  Musculoskeletal: Normal range of motion. He exhibits no edema and no tenderness.  Lymphadenopathy:    He has no cervical adenopathy.  Neurological: He is oriented to person, place, and time.  Skin: Skin is warm and dry. No rash noted. He is not diaphoretic. No erythema. No pallor.  Psychiatric: He has a normal mood and affect. His behavior is  normal. Judgment and thought content normal.  Assessment & Plan:

## 2011-02-02 NOTE — Telephone Encounter (Signed)
Spoke with MD who advised that patient may alternate ibuprofen 200mg  and tylenol (as directed per bottle) to help with fever. Patient mother notified per MD

## 2011-02-02 NOTE — ED Notes (Signed)
Pt reports fever of 104 F. Checked pt's temperature and found it to be 101.37F

## 2011-02-02 NOTE — Patient Instructions (Signed)

## 2011-02-02 NOTE — ED Notes (Signed)
Pt went to PCP today and was diagnosed with the flu and prescribed zithromax. Still had a fever and called EMS to come to the ER

## 2011-02-02 NOTE — Assessment & Plan Note (Signed)
I will check a CXR to look for pna

## 2011-02-02 NOTE — ED Provider Notes (Signed)
History     CSN: [id number] Arrival date & time: 02/02/2011  3:45 PM   First MD Initiated Contact with Patient 02/02/11 1621      Chief Complaint  Patient presents with  . Fever    (Consider location/radiation/quality/duration/timing/severity/associated sxs/prior treatment) Patient is a 30 y.o. male presenting with fever. The history is provided by the patient and a parent.  Fever Primary symptoms of the febrile illness include fever, fatigue, cough and myalgias. Primary symptoms do not include visual change, headaches, wheezing, shortness of breath, abdominal pain, nausea, vomiting, diarrhea, dysuria, altered mental status or rash. The current episode started 2 days ago. This is a new problem. The problem has been gradually worsening.  The fever began yesterday. The fever has been unchanged since its onset. The maximum temperature recorded prior to his arrival was 103 to 104 F. The temperature was taken by an oral thermometer.  The cough began 2 days ago. The cough is new. The cough is non-productive.  The myalgias are not associated with weakness.  Pt saw his PMD today with same complaints. Had a chest xray that was negative for pneumonia. Was dx with flu. Z-pack rx was given. Pt fever worsened after he got home. Took ibuprofen at 3pm with gradual improvement of myalgias and fever since that time.  Past Medical History  Diagnosis Date  . Anxiety   . OCD (obsessive compulsive disorder)   . ADD (attention deficit disorder with hyperactivity)     oppositional defiant disorder of adolescence  . Personality disorder     with schizoid features  . Tourette's     per [facility]  . Insomnia   . HTN (hypertension)     borderline  . Obesity   . Allergic rhinitis   . Glucose intolerance (impaired glucose tolerance)   . GERD (gastroesophageal reflux disease)   . Vitamin D deficiency   . Depression     Past Surgical History  Procedure Date  . Adnoidectomy     Family History    Problem Relation Age of Onset  . Cholelithiasis Maternal Grandmother   . Diabetes Maternal Grandmother   . Prostate cancer Maternal Grandfather   . Colon polyps Maternal Grandfather     History  Substance Use Topics  . Smoking status: Never Smoker   . Smokeless tobacco: Never Used  . Alcohol Use: No      Review of Systems  Constitutional: Positive for fever, chills and fatigue. Negative for appetite change.  HENT: Positive for congestion. Negative for ear pain, sore throat, trouble swallowing, neck pain, neck stiffness and tinnitus.   Eyes: Negative for pain and visual disturbance.  Respiratory: Positive for cough. Negative for chest tightness, shortness of breath and wheezing.   Cardiovascular: Negative for chest pain and palpitations.  Gastrointestinal: Negative for nausea, vomiting, abdominal pain, diarrhea and blood in stool.  Genitourinary: Negative for dysuria, hematuria and flank pain.  Musculoskeletal: Positive for myalgias. Negative for back pain and gait problem.  Skin: Negative for pallor, rash and wound.  Neurological: Positive for dizziness. Negative for seizures, syncope, speech difficulty, weakness, numbness and headaches.  Hematological: Does not bruise/bleed easily.  Psychiatric/Behavioral: Negative for behavioral problems, confusion and altered mental status.    Allergies  Meclizine; Quetiapine; and Penicillins  Home Medications   Current Outpatient Rx  Name Route Sig Dispense Refill  . ARIPIPRAZOLE 10 MG PO TABS Oral Take 10 mg by mouth at bedtime.      . AZITHROMYCIN 500 MG PO TABS Oral  Take 1 tablet (500 mg total) by mouth daily. 3 tablet 0  . DESONIDE 0.05 % EX LOTN Topical Apply 1 application topically as needed. dermatitis    . MIRTAZAPINE 30 MG PO TABS Oral Take 30 mg by mouth at bedtime.      . CENTRUM PO Oral Take 1 tablet by mouth daily.      . VENLAFAXINE HCL 37.5 MG PO TABS Oral Take 37.5 mg by mouth daily.      [name] ESOMEPRAZOLE MAGNESIUM 40  MG PO CPDR Oral Take 1 capsule (40 mg total) by mouth daily. 30 capsule 3    BP 122/77  Pulse 114  Temp(Src) 100 F (37.8 C) (Oral)  Resp 20  SpO2 97%  Physical Exam  Constitutional: He is oriented to person, place, and time. He appears well-developed and well-nourished. No distress.  HENT:  Head: Normocephalic and atraumatic.  Right Ear: Tympanic membrane and external ear normal.  Left Ear: Tympanic membrane and external ear normal.  Mouth/Throat: Mucous membranes are normal. Posterior oropharyngeal erythema present. No oropharyngeal exudate or posterior oropharyngeal edema.  Neck: Normal range of motion. Neck supple.  Cardiovascular: Normal rate, regular rhythm, normal heart sounds and intact distal pulses.   No murmur heard. Pulmonary/Chest: Effort normal and breath sounds normal. No respiratory distress. He exhibits no tenderness.  Abdominal: Soft. Bowel sounds are normal. He exhibits no distension. There is no tenderness.  Musculoskeletal: Normal range of motion. He exhibits no edema and no tenderness.  Lymphadenopathy:    He has no cervical adenopathy.  Neurological: He is alert and oriented to person, place, and time. No cranial nerve deficit. Coordination normal.  Skin: Skin is warm. No rash noted. He is not diaphoretic.       Skin moist  Psychiatric: He has a normal mood and affect. His behavior is normal.    ED Course  Procedures (including critical care time)  Labs Reviewed - No data to display Dg Chest 2 View  02/02/2011  *RADIOLOGY REPORT*  Clinical Data: Cough  CHEST - 2 VIEW  Comparison: None.  Findings: Negative for heart failure or pneumonia.  Lungs are clear with mild hyperinflation.  Mild left lower lobe atelectasis.  IMPRESSION: Mild hyperinflation.  Mild left lower lobe atelectasis.  No definite pneumonia.  Original Report Authenticated By: Camelia Phenes, M.D.     1. Influenza       MDM  CXR reviewed, no evidence of pneumonia. Discussed flu course  and symptoms with patient and mother. IV NS bolus given as pt was initially tachycardic without significant fever. VS improved and HR in 90s on ausculation- mother reports this is normal for pt. Will d/c home with instructions to continue supportive measures and f/u with PCP if no improvement after several more days.        Elwyn Reach [place], [place] 02/02/11 1745

## 2011-02-02 NOTE — ED Notes (Signed)
Pt. Being Discharged and IV discontined

## 2011-02-03 NOTE — Telephone Encounter (Signed)
Called and spoke with pt. Pt scheduled to see [facility] 02/14/11 at 9:15 am

## 2011-02-03 NOTE — ED Provider Notes (Signed)
Medical screening examination/treatment/procedure(s) were performed by non-physician practitioner and as supervising physician I was immediately available for consultation/collaboration.   Priscille Shadduck, MD 02/03/11 0023 

## 2011-02-09 ENCOUNTER — Ambulatory Visit (INDEPENDENT_AMBULATORY_CARE_PROVIDER_SITE_OTHER): Payer: Medicare Other | Admitting: Internal Medicine

## 2011-02-09 ENCOUNTER — Encounter: Payer: Self-pay | Admitting: Internal Medicine

## 2011-02-09 VITALS — BP 120/80 | HR 76 | Temp 98.2°F | Resp 16 | Wt 224.0 lb

## 2011-02-09 DIAGNOSIS — J209 Acute bronchitis, unspecified: Secondary | ICD-10-CM

## 2011-02-09 MED ORDER — MOXIFLOXACIN HCL 400 MG PO TABS: 400.0000 mg | ORAL_TABLET | Freq: Every day | ORAL | Status: AC

## 2011-02-09 NOTE — Assessment & Plan Note (Signed)
He has a persistent productive cough that did not resolve after a zpak so I am concerned that he has a resistant bacterial infection and have therefore changed his antibiotic to avelox

## 2011-02-09 NOTE — Patient Instructions (Signed)

## 2011-02-09 NOTE — Assessment & Plan Note (Signed)
This aspect has resolved

## 2011-02-09 NOTE — Progress Notes (Signed)
Subjective:    Patient ID: Bruce Ellison, male    DOB: 07/11/1980, 30 y.o.   MRN: [id number]  Cough This is a recurrent problem. The current episode started 1 to 4 weeks ago. The problem has been unchanged. The problem occurs every few hours. The cough is productive of purulent sputum. Associated symptoms include chills and sweats. Pertinent negatives include no chest pain, ear congestion, ear pain, fever, headaches, heartburn, hemoptysis, myalgias, nasal congestion, postnasal drip, rash, rhinorrhea, sore throat, shortness of breath, weight loss or wheezing. The symptoms are aggravated by nothing. He has tried OTC cough suppressant (zpak) for the symptoms. The treatment provided mild relief.      Review of Systems  Constitutional: Positive for chills. Negative for fever, weight loss, diaphoresis, activity change, appetite change, fatigue and unexpected weight change.  HENT: Negative.  Negative for ear pain, sore throat, rhinorrhea and postnasal drip.   Eyes: Negative.   Respiratory: Positive for cough. Negative for hemoptysis, shortness of breath, wheezing and stridor.   Cardiovascular: Negative for chest pain, palpitations and leg swelling.  Gastrointestinal: Negative for heartburn, nausea, vomiting, abdominal pain, diarrhea, constipation, blood in stool and abdominal distention.  Genitourinary: Negative for dysuria, urgency, frequency, hematuria, flank pain, decreased urine volume, enuresis and difficulty urinating.  Musculoskeletal: Negative for myalgias, back pain, joint swelling, arthralgias and gait problem.  Skin: Negative for color change, pallor, rash and wound.  Neurological: Negative.  Negative for dizziness, tremors, seizures, syncope, facial asymmetry, speech difficulty, weakness, light-headedness, numbness and headaches.  Hematological: Negative for adenopathy. Does not bruise/bleed easily.  Psychiatric/Behavioral: Negative.        Objective:   Physical Exam  Vitals  reviewed. Constitutional: He is oriented to person, place, and time. He appears well-developed and well-nourished. No distress.  HENT:  Head: Normocephalic and atraumatic.  Mouth/Throat: Oropharynx is clear and moist. No oropharyngeal exudate.  Eyes: Conjunctivae are normal. Right eye exhibits no discharge. Left eye exhibits no discharge. No scleral icterus.  Neck: Normal range of motion. Neck supple. No JVD present. No tracheal deviation present. No thyromegaly present.  Cardiovascular: Normal rate, regular rhythm, normal heart sounds and intact distal pulses.  Exam reveals no gallop and no friction rub.   No murmur heard. Pulmonary/Chest: Effort normal and breath sounds normal. No stridor. No respiratory distress. He has no wheezes. He has no rales. He exhibits no tenderness.  Abdominal: Soft. Bowel sounds are normal. He exhibits no distension and no mass. There is no tenderness. There is no rebound and no guarding.  Musculoskeletal: Normal range of motion. He exhibits no edema and no tenderness.  Lymphadenopathy:    He has no cervical adenopathy.  Neurological: He is oriented to person, place, and time.  Skin: Skin is warm and dry. No rash noted. He is not diaphoretic. No erythema. No pallor.  Psychiatric: He has a normal mood and affect. His behavior is normal. Judgment and thought content normal.      Lab Results  Component Value Date   WBC 4.9 01/30/2011   HGB 17.2* 01/30/2011   HCT 49.7 01/30/2011   PLT 272.0 01/30/2011   GLUCOSE 100* 12/08/2010   CHOL 203* 08/30/2010   TRIG 81.0 08/30/2010   HDL 46.80 08/30/2010   LDLDIRECT 136.9 08/30/2010   LDLCALC 130* 05/02/2006   ALT 46 12/08/2010   AST 32 12/08/2010   NA 140 12/08/2010   K 4.0 12/08/2010   CL 105 12/08/2010   CREATININE 0.9 12/08/2010   BUN 13 12/08/2010   CO2  27 12/08/2010   TSH 0.86 01/30/2011   HGBA1C 5.5 07/23/2008      Assessment & Plan:

## 2011-02-14 ENCOUNTER — Ambulatory Visit (INDEPENDENT_AMBULATORY_CARE_PROVIDER_SITE_OTHER): Payer: Medicare Other | Admitting: Pulmonary Disease

## 2011-02-14 ENCOUNTER — Encounter: Payer: Self-pay | Admitting: Pulmonary Disease

## 2011-02-14 VITALS — BP 108/72 | HR 95 | Temp 98.0°F | Ht 67.0 in | Wt 224.2 lb

## 2011-02-14 DIAGNOSIS — G4733 Obstructive sleep apnea (adult) (pediatric): Secondary | ICD-10-CM

## 2011-02-14 NOTE — Progress Notes (Signed)
Subjective:    Patient ID: Bruce Ellison, male    DOB: 04/04/1980, 30 y.o.   MRN: [id number]  HPI The patient is a 30 year old male blood and asked to see for possible obstructive sleep apnea.  He recently underwent a sleep study last month, but only had 38 minutes of total sleep time.  However, he had 9 obstructive hypopneas and significant numbers of respiratory effort related arousals during this time period.  Patient states that he has been told he has loud snoring, as well as an abnormal breathing pattern during sleep.  He has occasional choking arousals.  He does not have a lot of awakenings during the night, but does not feel rested in the mornings upon arising even if he sleeps for 9 straight hours.  Patient does note sleep pressured during the day with periods of in activity such as reading or watching television.  He also has some sleepiness in the evenings, but does not fall asleep.  He does take occasional daytime naps.  He denies any issues with sleepiness while driving.  The patient states that his weight has been stable over the last 2 years, and his Epworth score at the time of his sleep study was 6.  Sleep Questionnaire: What time do you typically go to bed?( Between what hours) 8:30 to 9:30 pm How long does it take you to fall asleep? 1 hour How many times during the night do you wake up? 1 What time do you get out of bed to start your day? 0630 Do you drive or operate heavy machinery in your occupation? No How much has your weight changed (up or down) over the past two years? (In pounds) 0 oz (0 kg) Have you ever had a sleep study before? Yes If yes, location of study? [facility] If yes, date of study? 01/2011 Do you currently use CPAP? No Do you wear oxygen at any time? No    Review of Systems  Constitutional: Negative for fever and unexpected weight change.  HENT: Positive for congestion and sore throat. Negative for ear pain, nosebleeds, rhinorrhea, sneezing, trouble swallowing, dental  problem, postnasal drip and sinus pressure.   Eyes: Negative for redness and itching.  Respiratory: Positive for cough and shortness of breath. Negative for chest tightness and wheezing.   Cardiovascular: Negative for palpitations and leg swelling.  Gastrointestinal: Negative for nausea and vomiting.  Genitourinary: Negative for dysuria.  Musculoskeletal: Negative for joint swelling.  Skin: Negative for rash.  Neurological: Negative for headaches.  Hematological: Does not bruise/bleed easily.  Psychiatric/Behavioral: Positive for dysphoric mood. The patient is nervous/anxious.        Objective:   Physical Exam Constitutional:  Overweight male, no acute distress  HENT:  Nares patent without discharge, but narrowed.    Oropharynx without exudate, palate and uvula are thickened and elongated.   Eyes:  Perrla, eomi, no scleral icterus  Neck:  No JVD, no TMG  Cardiovascular:  Normal rate, regular rhythm, no rubs or gallops.  No murmurs        Intact distal pulses  Pulmonary :  Normal breath sounds, no stridor or respiratory distress   No rales, rhonchi, or wheezing  Abdominal:  Soft, nondistended, bowel sounds present.  No tenderness noted.   Musculoskeletal:  No lower extremity edema noted.  Lymph Nodes:  No cervical lymphadenopathy noted  Skin:  No cyanosis noted  Neurologic:  Alert, appropriate, moves all 4 extremities without obvious deficit.  Assessment & Plan:

## 2011-02-14 NOTE — Patient Instructions (Signed)
Will set up for a home sleep test.  Will call when results are available.  Work on weight loss.

## 2011-02-14 NOTE — Assessment & Plan Note (Signed)
The patient's history is very suggestive of sleep disordered breathing, and although his recent sleep study he had a short total sleep time, he had significant numbers of abnormal respirations.  The patient feels that he sleeps much better in his own environment, and therefore I think he would be a good candidate for a home sleep test.  I've had a long discussion with the patient about sleep apnea, including its impact to his quality of life and cardiovascular health.  I have asked him to work on weight loss, and we'll call him with the results of his home sleep test.

## 2011-02-24 ENCOUNTER — Telehealth: Payer: Self-pay | Admitting: Pulmonary Disease

## 2011-02-24 ENCOUNTER — Ambulatory Visit: Payer: Medicare Other | Admitting: Internal Medicine

## 2011-02-24 NOTE — Telephone Encounter (Signed)
Pt aware and appt scheduled for 03-23-2010 at 1115am with KC.

## 2011-02-24 NOTE — Telephone Encounter (Signed)
Pt has at least moderate osa by his recent HST.    Please call pt and let him know he needs ov to f/u his sleep study, and go ahead and make the apptm for him.  Thanks.

## 2011-03-12 DIAGNOSIS — Z23 Encounter for immunization: Secondary | ICD-10-CM | POA: Diagnosis not present

## 2011-03-14 ENCOUNTER — Encounter: Payer: Self-pay | Admitting: Pulmonary Disease

## 2011-03-14 ENCOUNTER — Ambulatory Visit (INDEPENDENT_AMBULATORY_CARE_PROVIDER_SITE_OTHER): Payer: Medicare Other | Admitting: Pulmonary Disease

## 2011-03-14 VITALS — BP 110/78 | HR 87 | Temp 98.3°F | Ht 67.0 in | Wt 234.0 lb

## 2011-03-14 DIAGNOSIS — G4733 Obstructive sleep apnea (adult) (pediatric): Secondary | ICD-10-CM

## 2011-03-14 NOTE — Patient Instructions (Signed)
Will start on cpap.  Please call if having issues with tolerance. Work on weight loss followup with me in 6 weeks.  

## 2011-03-14 NOTE — Assessment & Plan Note (Signed)
The patient has moderate obstructive sleep apnea by his recent home sleep test, and is clearly symptomatic during the day.  I have discussed with him the various options for treatment, including a trial of weight loss alone, upper airway surgery, dental appliance, and finally CPAP.  Given his severely of sleep apnea, as well as his significant symptoms, I have highly recommended a trial of CPAP while he is working on weight loss.  The patient is agreeable to this approach. I will set the patient up on cpap at a moderate pressure level to allow for desensitization, and will troubleshoot the device over the next 4-6weeks if needed.  The pt is to call me if having issues with tolerance.  Will then optimize the pressure once patient is able to wear cpap on a consistent basis.

## 2011-03-14 NOTE — Progress Notes (Signed)
  Subjective:    Patient ID: Bruce Ellison, male    DOB: April 29, 1980, 31 y.o.   MRN: [id number]  HPI The patient comes in today for follow up of his recent home sleep testing.  He was found to have moderate obstructive sleep apnea, with an AHI of 21 events per hour.  I have reviewed the study and detail with him, and answered all of his questions.   Review of Systems  Constitutional: Negative for fever and unexpected weight change.  HENT: Positive for congestion and rhinorrhea. Negative for ear pain, nosebleeds, sore throat, sneezing, trouble swallowing, dental problem, postnasal drip and sinus pressure.   Eyes: Positive for redness and itching.  Respiratory: Negative for cough, chest tightness, shortness of breath and wheezing.   Cardiovascular: Negative for palpitations and leg swelling.  Gastrointestinal: Negative for nausea and vomiting.  Genitourinary: Negative for dysuria.  Musculoskeletal: Negative for joint swelling.  Skin: Negative for rash.  Neurological: Positive for headaches.  Hematological: Does not bruise/bleed easily.  Psychiatric/Behavioral: Negative for dysphoric mood. The patient is not nervous/anxious.        Objective:   Physical Exam Obese male in no acute distress Nose without purulence or discharge noted Lower extremities without edema, no cyanosis Alert, but appears somewhat sleepy, moves all 4 extremities.       Assessment & Plan:

## 2011-03-15 ENCOUNTER — Ambulatory Visit (INDEPENDENT_AMBULATORY_CARE_PROVIDER_SITE_OTHER): Payer: Medicare Other | Admitting: Pulmonary Disease

## 2011-03-15 DIAGNOSIS — G4733 Obstructive sleep apnea (adult) (pediatric): Secondary | ICD-10-CM | POA: Diagnosis not present

## 2011-03-23 DIAGNOSIS — R42 Dizziness and giddiness: Secondary | ICD-10-CM | POA: Diagnosis not present

## 2011-04-14 DIAGNOSIS — F200 Paranoid schizophrenia: Secondary | ICD-10-CM | POA: Diagnosis not present

## 2011-04-14 DIAGNOSIS — F4001 Agoraphobia with panic disorder: Secondary | ICD-10-CM | POA: Diagnosis not present

## 2011-04-25 ENCOUNTER — Encounter: Payer: Self-pay | Admitting: Pulmonary Disease

## 2011-04-25 ENCOUNTER — Ambulatory Visit (INDEPENDENT_AMBULATORY_CARE_PROVIDER_SITE_OTHER): Payer: Medicare Other | Admitting: Pulmonary Disease

## 2011-04-25 VITALS — BP 140/80 | HR 89 | Temp 98.5°F | Ht 67.0 in | Wt 236.2 lb

## 2011-04-25 DIAGNOSIS — G4733 Obstructive sleep apnea (adult) (pediatric): Secondary | ICD-10-CM | POA: Diagnosis not present

## 2011-04-25 NOTE — Assessment & Plan Note (Signed)
The patient is wearing CPAP nightly, but only about 3-5 hours a night.  Based on his description, I suspect the issue is his mask fit and also a sub-optimal pressure setting.  At this point I will optimize his pressure for him, and we'll also give his DME to work on mask fitting.  I have also encouraged him to work aggressively on weight loss. Care Plan:  At this point, will arrange for the patient's machine to be changed over to auto mode for 2 weeks to optimize their pressure.  I will review the downloaded data once sent by dme, and also evaluate for compliance, leaks, and residual osa.  I will call the patient and dme to discuss the results, and have the patient's machine set appropriately.  This will serve as the pt's cpap pressure titration.

## 2011-04-25 NOTE — Patient Instructions (Signed)
Will put your machine on the auto setting for the next 2 weeks to optimize your pressure.  I will call you with the results.  At that point, can stay on the auto setting if it is more comfortable for you, or can put on optimal fixed pressure. Work on weight loss The most important thing is to call me if you are continuing to have tolerance issues.  The goal is to wear at least 6hrs a night.  followup with me in 6mos if doing well.

## 2011-04-25 NOTE — Progress Notes (Signed)
  Subjective:    Patient ID: Bruce Ellison, male    DOB: November 05, 1980, 31 y.o.   MRN: [id number]  HPI The patient comes in today for followup of his known obstructive sleep apnea.  He was started on CPAP at the last visit, has been wearing it every night.  However, he is only using 3-5 hours a night with the device.  He feels the air is "stale" and that he is not getting enough pressure.  He is also having some fitting issues with the mask, and we can work on this.  He has seen some improvement in his sleep, but not a lot because of the above issues.   Review of Systems  Constitutional: Negative for fever and unexpected weight change.  HENT: Positive for congestion. Negative for ear pain, nosebleeds, sore throat, rhinorrhea, sneezing, trouble swallowing, dental problem, postnasal drip and sinus pressure.   Eyes: Positive for redness. Negative for itching.  Respiratory: Negative for cough, chest tightness, shortness of breath and wheezing.   Cardiovascular: Negative for palpitations and leg swelling.  Gastrointestinal: Negative for nausea and vomiting.  Genitourinary: Negative for dysuria.  Musculoskeletal: Negative for joint swelling.  Skin: Negative for rash.  Neurological: Negative for headaches.  Hematological: Does not bruise/bleed easily.  Psychiatric/Behavioral: Negative for dysphoric mood. The patient is not nervous/anxious.        Objective:   Physical Exam Overweight male in no acute distress Nose without purulence or discharge noted No skin breakdown or pressure necrosis from the CPAP mask Chest clear to auscultation Core with regular rate and rhythm Lower extremities without edema, no cyanosis Alert, does not appear to be sleepy, moves all 4 extremities.       Assessment & Plan:

## 2011-06-05 ENCOUNTER — Encounter: Payer: Self-pay | Admitting: Pulmonary Disease

## 2011-06-09 ENCOUNTER — Telehealth: Payer: Self-pay | Admitting: Pulmonary Disease

## 2011-06-09 DIAGNOSIS — G4733 Obstructive sleep apnea (adult) (pediatric): Secondary | ICD-10-CM

## 2011-06-09 NOTE — Telephone Encounter (Signed)
I spoke with pt and he requesting an order to d/c his cpap therapy. He states he  Has lost 30 lbs and is sleeping much better. He states he does not feel like he needs it any longer. Please advise Dr. Shelle Iron, thanks

## 2011-06-09 NOTE — Telephone Encounter (Signed)
I spoke with pt and is aware of [facility] response. He states he understands but still wants to D/C the cpap. I have sent order and nothing further was needed

## 2011-06-09 NOTE — Telephone Encounter (Signed)
Let pt know unsure if 30 pounds is enough to cure his sleep apnea, but if he continues to lose weight, should not be a problem.  If he gains weight back though, sleep apnea will continue to be an issue for him.  Let him know that untreated sleep apnea can cause his blood counts to go up, so keep this in mind as well. Ok to send order to pcc to d/c cpap.

## 2011-06-13 DIAGNOSIS — F4001 Agoraphobia with panic disorder: Secondary | ICD-10-CM | POA: Diagnosis not present

## 2011-06-13 DIAGNOSIS — F200 Paranoid schizophrenia: Secondary | ICD-10-CM | POA: Diagnosis not present

## 2011-06-29 ENCOUNTER — Other Ambulatory Visit: Payer: Self-pay | Admitting: Internal Medicine

## 2011-07-03 DIAGNOSIS — H40059 Ocular hypertension, unspecified eye: Secondary | ICD-10-CM | POA: Diagnosis not present

## 2011-09-12 DIAGNOSIS — F4001 Agoraphobia with panic disorder: Secondary | ICD-10-CM | POA: Diagnosis not present

## 2011-09-12 DIAGNOSIS — F200 Paranoid schizophrenia: Secondary | ICD-10-CM | POA: Diagnosis not present

## 2011-12-12 DIAGNOSIS — F200 Paranoid schizophrenia: Secondary | ICD-10-CM | POA: Diagnosis not present

## 2011-12-12 DIAGNOSIS — F4001 Agoraphobia with panic disorder: Secondary | ICD-10-CM | POA: Diagnosis not present

## 2012-01-23 DIAGNOSIS — F4001 Agoraphobia with panic disorder: Secondary | ICD-10-CM | POA: Diagnosis not present

## 2012-01-23 DIAGNOSIS — F200 Paranoid schizophrenia: Secondary | ICD-10-CM | POA: Diagnosis not present

## 2012-01-29 DIAGNOSIS — Z23 Encounter for immunization: Secondary | ICD-10-CM | POA: Diagnosis not present

## 2012-02-16 ENCOUNTER — Other Ambulatory Visit (INDEPENDENT_AMBULATORY_CARE_PROVIDER_SITE_OTHER): Payer: Medicare Other

## 2012-02-16 ENCOUNTER — Ambulatory Visit (INDEPENDENT_AMBULATORY_CARE_PROVIDER_SITE_OTHER): Payer: Medicare Other | Admitting: Internal Medicine

## 2012-02-16 ENCOUNTER — Encounter: Payer: Self-pay | Admitting: Internal Medicine

## 2012-02-16 VITALS — BP 132/72 | HR 80 | Temp 98.7°F | Resp 16 | Wt 189.0 lb

## 2012-02-16 DIAGNOSIS — R634 Abnormal weight loss: Secondary | ICD-10-CM

## 2012-02-16 DIAGNOSIS — E559 Vitamin D deficiency, unspecified: Secondary | ICD-10-CM | POA: Diagnosis not present

## 2012-02-16 DIAGNOSIS — D45 Polycythemia vera: Secondary | ICD-10-CM | POA: Diagnosis not present

## 2012-02-16 DIAGNOSIS — H6120 Impacted cerumen, unspecified ear: Secondary | ICD-10-CM

## 2012-02-16 DIAGNOSIS — D751 Secondary polycythemia: Secondary | ICD-10-CM

## 2012-02-16 DIAGNOSIS — G4733 Obstructive sleep apnea (adult) (pediatric): Secondary | ICD-10-CM

## 2012-02-16 DIAGNOSIS — J029 Acute pharyngitis, unspecified: Secondary | ICD-10-CM | POA: Diagnosis not present

## 2012-02-16 LAB — BASIC METABOLIC PANEL
Calcium: 9.4 mg/dL (ref 8.4–10.5)
Creatinine, Ser: 0.9 mg/dL (ref 0.4–1.5)
GFR: 105.62 mL/min (ref >60.00)
Glucose, Bld: 101 mg/dL — ABNORMAL HIGH (ref 70–99)
Sodium: 138 mEq/L (ref 135–145)

## 2012-02-16 LAB — HEPATIC FUNCTION PANEL
ALT: 27 U/L (ref 0–53)
AST: 22 U/L (ref 0–37)
Albumin: 4.9 g/dL (ref 3.5–5.2)
Alkaline Phosphatase: 56 U/L (ref 39–117)

## 2012-02-16 NOTE — Progress Notes (Signed)
Patient ID: Bruce Ellison, male   DOB: 21-Apr-1980, 31 y.o.   MRN: [id number]  Subjective:    Patient ID: Bruce Ellison, male    DOB: August 12, 1980, 31 y.o.   MRN: [id number]  Sore Throat  This is a new problem. The current episode started more than 1 month ago. The problem has been waxing and waning. The pain is at a severity of 1/10. The pain is mild. Pertinent negatives include no abdominal pain, congestion, diarrhea, neck pain, stridor, trouble swallowing or vomiting. He has had no exposure to strep. The treatment provided mild relief.   Wt Readings from Last 3 Encounters:  02/16/12 189 lb (85.73 kg)  04/25/11 236 lb 3.2 oz (107.14 kg)  03/14/11 234 lb (106.142 kg)   BP Readings from Last 3 Encounters:  02/16/12 132/72  04/25/11 140/80  03/14/11 110/78     BP 132/72  Pulse 80  Temp 98.7 F (37.1 C) (Oral)  Resp 16  Wt 189 lb (85.73 kg)   Review of Systems  Constitutional: Positive for fatigue and unexpected weight change (lost wt). Negative for diaphoresis, activity change and appetite change.  HENT: Negative for congestion, facial swelling, mouth sores, trouble swallowing, neck pain, neck stiffness, voice change and sinus pressure.   Eyes: Negative.   Respiratory: Negative for stridor.   Cardiovascular: Negative for palpitations and leg swelling.  Gastrointestinal: Negative for nausea, vomiting, abdominal pain, diarrhea and constipation.  Genitourinary: Negative for dysuria, urgency, decreased urine volume, enuresis and difficulty urinating.  Musculoskeletal: Negative for back pain, joint swelling, arthralgias and gait problem.  Skin: Negative for color change, pallor and wound.  Neurological: Positive for dizziness. Negative for tremors, seizures, syncope, facial asymmetry, speech difficulty, weakness, light-headedness and numbness.  Hematological: Negative for adenopathy. Does not bruise/bleed easily.  Psychiatric/Behavioral: Negative.        Objective:   Physical Exam  Vitals  reviewed. Constitutional: He is oriented to person, place, and time. He appears well-developed and well-nourished. No distress.  HENT:  Head: No trismus in the jaw.  Right Ear: Hearing, tympanic membrane, external ear and ear canal normal.  Left Ear: Hearing, tympanic membrane, external ear and ear canal normal.  Nose: Nose normal. No mucosal edema, rhinorrhea, nose lacerations, sinus tenderness, nasal deformity, septal deviation or nasal septal hematoma. No epistaxis.  No foreign bodies. Right sinus exhibits no maxillary sinus tenderness and no frontal sinus tenderness. Left sinus exhibits no maxillary sinus tenderness and no frontal sinus tenderness.  Mouth/Throat: Mucous membranes are normal. Mucous membranes are not pale, not dry and not cyanotic. No uvula swelling. Posterior oropharyngeal erythema present. No oropharyngeal exudate, posterior oropharyngeal edema or tonsillar abscesses.  Eyes: Conjunctivae normal are normal. Right eye exhibits no discharge. Left eye exhibits no discharge. No scleral icterus.  Neck: Normal range of motion. Neck supple. No JVD present. No tracheal deviation present. No thyromegaly present.  Cardiovascular: Normal rate, regular rhythm, normal heart sounds and intact distal pulses.  Exam reveals no gallop and no friction rub.   No murmur heard. Pulmonary/Chest: Effort normal and breath sounds normal. No stridor. No respiratory distress. He has no wheezes. He has no rales. He exhibits no tenderness.  Abdominal: Soft. Bowel sounds are normal. He exhibits no distension. There is no tenderness. There is no rebound and no guarding.  Musculoskeletal: Normal range of motion. He exhibits no edema and no tenderness.  Lymphadenopathy:    He has no cervical adenopathy.  Neurological: He is oriented to person, place, and time.  Skin:  Skin is warm and dry. No rash noted. He is not diaphoretic. No erythema. No pallor.  Psychiatric: He has a normal mood and affect. His behavior  is normal. Judgment and thought content normal.  R thyroid is tender a little Wax in B ears   Procedure Note :     Procedure :  Ear irrigation   Indication:  Cerumen impaction   Risks, including pain, dizziness, eardrum perforation, bleeding, infection and others as well as benefits were explained to the patient in detail. Verbal consent was obtained and the patient agreed to proceed.    We used "The [employer] Device" field with lukewarm water for irrigation. A large amount wax was recovered. Procedure has also required manual wax removal with an ear loop.   Tolerated well. Complications: None.   Postprocedure instructions :  Call if problems.        Assessment & Plan:

## 2012-02-16 NOTE — Assessment & Plan Note (Signed)
TSH 

## 2012-02-16 NOTE — Assessment & Plan Note (Signed)
Labs

## 2012-02-16 NOTE — Assessment & Plan Note (Signed)
Rapid strep Labs RTC 6 wks

## 2012-02-16 NOTE — Assessment & Plan Note (Signed)
Will irrigate 

## 2012-02-16 NOTE — Assessment & Plan Note (Signed)
Recheck CBC. 

## 2012-02-18 ENCOUNTER — Encounter: Payer: Self-pay | Admitting: Internal Medicine

## 2012-02-18 NOTE — Assessment & Plan Note (Signed)
He lost wt on diet

## 2012-02-21 ENCOUNTER — Encounter: Payer: Self-pay | Admitting: Internal Medicine

## 2012-02-21 ENCOUNTER — Ambulatory Visit (INDEPENDENT_AMBULATORY_CARE_PROVIDER_SITE_OTHER): Payer: Medicare Other | Admitting: Internal Medicine

## 2012-02-21 VITALS — BP 110/80 | HR 80 | Temp 98.4°F | Resp 16

## 2012-02-21 DIAGNOSIS — J029 Acute pharyngitis, unspecified: Secondary | ICD-10-CM

## 2012-02-21 DIAGNOSIS — M79609 Pain in unspecified limb: Secondary | ICD-10-CM

## 2012-02-21 DIAGNOSIS — M79605 Pain in left leg: Secondary | ICD-10-CM | POA: Insufficient documentation

## 2012-02-21 NOTE — Progress Notes (Signed)
Subjective:    Patient ID: Bruce Ellison, male    DOB: Aug 10, 1980, 31 y.o.   MRN: [id number]  Leg Pain  The incident occurred 3 to 5 days ago. The incident occurred in the street. There was no injury mechanism. The pain is at a severity of 3/10. The pain is mild. The pain has been improving since onset. Pertinent negatives include no inability to bear weight, loss of motion, muscle weakness, numbness or tingling. The symptoms are aggravated by movement. He has tried NSAIDs for the symptoms. The treatment provided significant relief.   Wt Readings from Last 3 Encounters:  02/16/12 189 lb (85.73 kg)  04/25/11 236 lb 3.2 oz (107.14 kg)  03/14/11 234 lb (106.142 kg)   BP Readings from Last 3 Encounters:  02/21/12 110/80  02/16/12 132/72  04/25/11 140/80     BP 110/80  Pulse 80  Temp 98.4 F (36.9 C) (Oral)  Resp 16   Review of Systems  Constitutional: Positive for fatigue and unexpected weight change (lost wt). Negative for diaphoresis, activity change and appetite change.  HENT: Negative for facial swelling, mouth sores, neck stiffness, voice change and sinus pressure.   Eyes: Negative.   Cardiovascular: Negative for palpitations and leg swelling.  Gastrointestinal: Negative for nausea and constipation.  Genitourinary: Negative for dysuria, urgency, decreased urine volume, enuresis and difficulty urinating.  Musculoskeletal: Negative for back pain, joint swelling, arthralgias and gait problem.  Skin: Negative for color change, pallor and wound.  Neurological: Positive for dizziness. Negative for tingling, tremors, seizures, syncope, facial asymmetry, speech difficulty, weakness, light-headedness and numbness.  Hematological: Negative for adenopathy. Does not bruise/bleed easily.  Psychiatric/Behavioral: Negative.        Objective:   Physical Exam  Vitals reviewed. Constitutional: He is oriented to person, place, and time. He appears well-developed and well-nourished. No  distress.  HENT:  Head: No trismus in the jaw.  Right Ear: Hearing, tympanic membrane, external ear and ear canal normal.  Left Ear: Hearing, tympanic membrane, external ear and ear canal normal.  Nose: Nose normal. No mucosal edema, rhinorrhea, nose lacerations, sinus tenderness, nasal deformity, septal deviation or nasal septal hematoma. No epistaxis.  No foreign bodies. Right sinus exhibits no maxillary sinus tenderness and no frontal sinus tenderness. Left sinus exhibits no maxillary sinus tenderness and no frontal sinus tenderness.  Mouth/Throat: Mucous membranes are normal. Mucous membranes are not pale, not dry and not cyanotic. No uvula swelling. Posterior oropharyngeal erythema present. No oropharyngeal exudate, posterior oropharyngeal edema or tonsillar abscesses.  Eyes: Conjunctivae normal are normal. Right eye exhibits no discharge. Left eye exhibits no discharge. No scleral icterus.  Neck: Normal range of motion. Neck supple. No JVD present. No tracheal deviation present. No thyromegaly present.  Cardiovascular: Normal rate, regular rhythm, normal heart sounds and intact distal pulses.  Exam reveals no gallop and no friction rub.   No murmur heard. Pulmonary/Chest: Effort normal and breath sounds normal. No stridor. No respiratory distress. He has no wheezes. He has no rales. He exhibits no tenderness.  Abdominal: Soft. Bowel sounds are normal. He exhibits no distension. There is no tenderness. There is no rebound and no guarding.  Musculoskeletal: Normal range of motion. He exhibits no edema and no tenderness.  Lymphadenopathy:    He has no cervical adenopathy.  Neurological: He is oriented to person, place, and time.  Skin: Skin is warm and dry. No rash noted. He is not diaphoretic. No erythema. No pallor.  Psychiatric: He has a normal mood  and affect. His behavior is normal. Judgment and thought content normal.  1 cm bruise in the R popl fossa, NT, no mass       Assessment &  Plan:

## 2012-02-21 NOTE — Assessment & Plan Note (Signed)
Better  

## 2012-02-21 NOTE — Assessment & Plan Note (Signed)
12/13 post knee - bruise ? Ruptured spider vein Ibuprofen prn

## 2012-02-22 ENCOUNTER — Telehealth: Payer: Self-pay | Admitting: *Deleted

## 2012-02-22 ENCOUNTER — Other Ambulatory Visit: Payer: Self-pay | Admitting: *Deleted

## 2012-02-22 DIAGNOSIS — I8310 Varicose veins of unspecified lower extremity with inflammation: Secondary | ICD-10-CM

## 2012-02-22 NOTE — Telephone Encounter (Signed)
Pt informed of MD's advisement. 

## 2012-02-22 NOTE — Telephone Encounter (Signed)
No need for a referral. Spider vein treatment is not covered. He can call and sch a cosmetic procedure himself Thx

## 2012-02-22 NOTE — Telephone Encounter (Signed)
Pt would like referral to [facility] in [place]-phone # [phone].

## 2012-02-23 ENCOUNTER — Other Ambulatory Visit: Payer: Medicare Other

## 2012-03-25 DIAGNOSIS — F200 Paranoid schizophrenia: Secondary | ICD-10-CM | POA: Diagnosis not present

## 2012-03-25 DIAGNOSIS — F4001 Agoraphobia with panic disorder: Secondary | ICD-10-CM | POA: Diagnosis not present

## 2012-03-29 ENCOUNTER — Encounter: Payer: Self-pay | Admitting: Surgery

## 2012-04-01 ENCOUNTER — Encounter: Payer: Self-pay | Admitting: Surgery

## 2012-04-01 ENCOUNTER — Ambulatory Visit (INDEPENDENT_AMBULATORY_CARE_PROVIDER_SITE_OTHER): Payer: Medicare Other | Admitting: Surgery

## 2012-04-01 ENCOUNTER — Ambulatory Visit (INDEPENDENT_AMBULATORY_CARE_PROVIDER_SITE_OTHER): Payer: Medicare Other | Admitting: *Deleted

## 2012-04-01 VITALS — BP 128/75 | HR 97 | Resp 16 | Ht 67.0 in | Wt 195.6 lb

## 2012-04-01 DIAGNOSIS — I83893 Varicose veins of bilateral lower extremities with other complications: Secondary | ICD-10-CM

## 2012-04-01 DIAGNOSIS — I8310 Varicose veins of unspecified lower extremity with inflammation: Secondary | ICD-10-CM

## 2012-04-01 NOTE — Progress Notes (Signed)
[facility] [place] [facility]ICAL   CC: Plotnikov, Georgina Quint, MD  HPI: This is a 32 y.o. male  Who presents today with the CC of venous reflux in his legs.  He states that last month 02/18/12, he had a vein behind his right knee "rupture" and states there was a large bruise behind his knee.    He says that his is painful.  He did see his PCP at that time.   He denies hx of blood clots or leg swelling.   He did use NSAIDs and this did help his symptoms.  He states that he only gets an occasional ache behind the knee now.  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  He has had significant weight loss over the past year.    He does have pain in his feet when lying flat, which he describes as aching.  He also states he has had dizziness, which has been evaluated and he does not have any inner ear problems.  He presents today for evaluation of spider veins.  Past Medical History  Diagnosis Date  . Anxiety   . OCD (obsessive compulsive disorder)   . ADD (attention deficit disorder with hyperactivity)     oppositional defiant disorder of adolescence  . Personality disorder     with schizoid features  . Tourette's     per [facility]  . Insomnia   . HTN (hypertension)     borderline  . Obesity   . Allergic rhinitis   . Glucose intolerance (impaired glucose tolerance)   . GERD (gastroesophageal reflux disease)   . Vitamin D deficiency   . Depression    Past Surgical History  Procedure Date  . Adnoidectomy 1992    Allergies  Allergen Reactions  . Meclizine     Urinary sx's  . Quetiapine Other (See Comments)    restlessness  . Penicillins Hives and Rash  . Sulfur Rash    Current Outpatient Prescriptions  Medication Sig Dispense Refill  . ARIPiprazole (ABILIFY) 10 MG tablet Take 5 mg by mouth at bedtime.       [name] desonide (DESOWEN) 0.05 % lotion Apply 1 application topically as needed. dermatitis      . loratadine  (CLARITIN) 10 MG tablet TAKE 1 TABLET EVERY DAY  30 tablet  5  . mirtazapine (REMERON) 30 MG tablet Take 30 mg by mouth at bedtime.        . Multiple Vitamins-Minerals (CENTRUM PO) Take 1 tablet by mouth daily.        [name] venlafaxine XR (EFFEXOR-XR) 150 MG 24 hr capsule Take 75 mg by mouth daily.         Family History  Problem Relation Age of Onset  . Cholelithiasis Maternal Grandmother   . Diabetes Maternal Grandmother   . Prostate cancer Maternal Grandfather   . Colon polyps Maternal Grandfather     History   Social History  . Marital Status: Single    Spouse Name: N/A    Number of Children: 0  . Years of Education: N/A   Occupational History  . unemployed    Social History Main Topics  . Smoking status: Never Smoker   . Smokeless tobacco: Never Used  . Alcohol Use: No  . Drug Use: No  . Sexually Active: Not Currently   Other Topics Concern  . Not on file   Social History Narrative  Lives with parents.Regular exercise- No     ROS: [x]  Positive   [ ]  Negative   [ ]  All sytems reviewed and are negative  General: [x ] Weight loss-intentional, [ ]  Weight gain, [ ]   Loss of appetite, [ ]  Fever Neurologic: [x ] Dizziness-PCP states that he does not have inner ear problems, [ ]  Blackouts, [ ]  Headaches, [ ]  Seizure, Stroke, [ ]  "Mini stroke", [ ]  Slurred speech, [ ]  Temporary blindness Ear/Nose/Throat: [ ]  Change in eyesight, [ ]  Change in hearing, [ ]  Nose bleeds, [ ]  Sore throat Vascular: [ ]  Pain in legs with walking, [x ] Pain in feet while lying flat-aching, [ ]  Non-healing ulcer,  [ ]  Blood clot in vein, [ ]  Phlebitis Pulmonary: [ ]  Home oxygen, [ ]  Productive cough, [ ]  Bronchitis, [ ]  Coughing up blood,  [ ]  Asthma, [ ]  Wheezing Musculoskeletal: [ ]  Arthritis, [ ]  Joint pain, [ ]  Muscle pain Cardiac: [ ]  Chest pain, [ ]  Chest tightness/pressure, [ ]  Shortness of breath when lying flat, [ ]  Shortness of breath with exertion, [ ]  Palpitations, [ ]  Heart murmur, [ ]   Arrythmia,  [ ]  Atrial fibrillation Hematologic: [ ]  Bleeding problems, [ ]  Clotting disorder, [ ]  Anemia Psychiatric:  [ ]  Depression, [ ]  Anxiety Gastrointestinal:  [ ]  Black stool,[ ]   Blood in stool, [ ]  Peptic ulcer disease, [ ]  Reflux, [ ]  Hiatal hernia, [ ]  Trouble swallowing, [ ]  Diarrhea, [ ]  Constipation Urinary:  [ ]  Kidney disease, [ ]  Burning with urination, [ ]  nocturia, [ ]  Difficulty urinating Endocrine: [ ]  hx diabetes, [ ]  hx thyroid disease Skin: [ ]  Ulcers, [ ]  Rashes   PHYSICAL EXAMINATION:  Filed Vitals:   04/01/12 1429  BP: 128/75  Pulse: 97  Resp: 16   Body mass index is 30.64 kg/(m^2).  General:  WDWN in NAD Gait: Normal HENT: WNL Eyes: PERRL Pulmonary: normal non-labored breathing , without Rales, rhonchi,  wheezing Cardiac: RRR, without  Murmurs, rubs or gallops; No carotid bruits Abdomen: soft, NT, no masses Skin: no rashes, ulcers noted Vascular Exam/Pulses: + palpable bilateral DP/PT/radial pulses.  He does have spider veins behind his right knee as well as his left thigh. Extremities: without ischemic changes, no Gangrene , no cellulitis; no open wounds;  Musculoskeletal: no muscle wasting or atrophy  Neurologic: A&O X 3; Appropriate Affect ; SENSATION: normal; MOTOR FUNCTION:  moving all extremities equally. Speech is fluent/normal  CBC    Component Value Date/Time   WBC 4.9 01/30/2011 1024   RBC 5.53 01/30/2011 1024   HGB 17.2* 01/30/2011 1024   HCT 49.7 01/30/2011 1024   PLT 272.0 01/30/2011 1024   MCV 89.8 01/30/2011 1024   MCHC 34.6 01/30/2011 1024   RDW 13.5 01/30/2011 1024   LYMPHSABS 1.2 01/30/2011 1024   MONOABS 0.5 01/30/2011 1024   EOSABS 0.2 01/30/2011 1024   BASOSABS 0.0 01/30/2011 1024    BMET    Component Value Date/Time   NA 138 02/16/2012 1503   K 3.8 02/16/2012 1503   CL 102 02/16/2012 1503   CO2 28 02/16/2012 1503   GLUCOSE 101* 02/16/2012 1503   BUN 10 02/16/2012 1503   CREATININE 0.9 02/16/2012 1503    CALCIUM 9.4 02/16/2012 1503   GFRNONAA 84.76 01/14/2010 1551   GFRAA 117 05/02/2006 0856   Lower extremity venous duplex reflux evaluation 04/01/12: 1.  There is no evidence of DVT or SVT 2. There is  significant superficial venous reflux in the right and left GSV's 3. There is evidence of deep reflux as stated above 4.  The small saphenous vein is competent bilaterally  ASSESSMENT/PLAN: 32 y.o. male with superficial venous reflux in the right and left leg.  -Given that he is relatively asymptomatic from this at this time, would not recommend any treatment.   -He is advised that if he starts to get significant swelling in his legs, aching in his legs, or another vein ruptures, we can see him back and decide upon treatment at that time.   Doreatha Massed, PA-C [facility] [phone]  Clinic MD Myra Gianotti    I agree with the above. The patient has been seen and examined. He had an episode of thrombophlebitis versus a ruptured spider vein behind his right knee. His symptoms have resolved rather quickly. He does not have evidence of lower extremity edema. He has virtually no lower extremity complaints other than some cosmetic appearing spider veins. I have reviewed his duplex ultrasound today which shows bilateral  greater saphenous reflux. There is no evidence of deep system reflux. I discussed these findings with the patient. At this time, I feel he is asymptomatic, and therefore I would not recommend any further treatment at this time. Should he develop symptoms of a venous claudication or worsening edema and or ulceration consideration could be made for laser ablation at that time, however at this time without evidence of swelling or discomfort I see no need to even put him into compression stockings. He will followup with me as needed but has been instructed as to what to watch out for.  Durene Cal

## 2012-04-18 ENCOUNTER — Ambulatory Visit (HOSPITAL_COMMUNITY): Payer: Medicare Other | Admitting: Psychiatry

## 2012-04-19 ENCOUNTER — Encounter: Payer: Self-pay | Admitting: Surgery

## 2012-04-20 ENCOUNTER — Other Ambulatory Visit: Payer: Self-pay

## 2012-04-22 ENCOUNTER — Encounter: Payer: Self-pay | Admitting: Surgery

## 2012-04-22 ENCOUNTER — Ambulatory Visit (INDEPENDENT_AMBULATORY_CARE_PROVIDER_SITE_OTHER): Payer: Medicare Other | Admitting: Surgery

## 2012-04-22 VITALS — BP 124/72 | HR 94 | Ht 67.0 in | Wt 198.4 lb

## 2012-04-22 DIAGNOSIS — I83893 Varicose veins of bilateral lower extremities with other complications: Secondary | ICD-10-CM

## 2012-04-22 NOTE — Progress Notes (Signed)
Vascular and Vein Specialist of [facility]   Patient name: Bruce Ellison MRN: [id number] DOB: [birth year] Sex: male     Chief Complaint  Patient presents with  . Re-evaluation    varicose vein f/u -  pt c/o intermittent bilateral LE pain     HISTORY OF PRESENT ILLNESS: The patient comes back today for further evaluation of his lower extremity venous disease. I saw him on January 27 for a venous evaluation. He stated that he had a vein burst behind his right knee. This left a large bruise and has become painful. He was also complaining of a similar spot on his left knee that had not burst. He denies swelling in either extremity. He denies cramping. A venous ultrasound revealed superficial and deep system reflux. I recommended ibuprofen, leg elevation, and warm compresses for treatment. I did not think he would benefit from compression stockings because of the lack of swelling. The patient comes back today complaining of an episode of pain behind his left knee about 2-3 days ago. The symptoms have resolved. He again states that he is not having swelling in his legs.  Past Medical History  Diagnosis Date  . Anxiety   . OCD (obsessive compulsive disorder)   . ADD (attention deficit disorder with hyperactivity)     oppositional defiant disorder of adolescence  . Personality disorder     with schizoid features  . Tourette's     per [facility]  . Insomnia   . HTN (hypertension)     borderline  . Obesity   . Allergic rhinitis   . Glucose intolerance (impaired glucose tolerance)   . GERD (gastroesophageal reflux disease)   . Vitamin D deficiency   . Depression     Past Surgical History  Procedure Laterality Date  . Adnoidectomy  1992    History   Social History  . Marital Status: Single    Spouse Name: N/A    Number of Children: 0  . Years of Education: N/A   Occupational History  . unemployed    Social History Main Topics  . Smoking status: Never Smoker   . Smokeless tobacco:  Never Used  . Alcohol Use: No  . Drug Use: No  . Sexually Active: Not Currently   Other Topics Concern  . Not on file   Social History Narrative   Lives with parents.   Regular exercise- No    Family History  Problem Relation Age of Onset  . Cholelithiasis Maternal Grandmother   . Diabetes Maternal Grandmother   . Prostate cancer Maternal Grandfather   . Colon polyps Maternal Grandfather     Allergies as of 04/22/2012 - Review Complete 04/22/2012  Allergen Reaction Noted  . Meclizine  09/06/2010  . Quetiapine Other (See Comments) 04/11/2007  . Penicillins Hives and Rash   . Sulfur Rash 04/01/2012    Current Outpatient Prescriptions on File Prior to Visit  Medication Sig Dispense Refill  . ARIPiprazole (ABILIFY) 10 MG tablet Take 5 mg by mouth at bedtime.       [name] desonide (DESOWEN) 0.05 % lotion Apply 1 application topically as needed. dermatitis      . loratadine (CLARITIN) 10 MG tablet TAKE 1 TABLET EVERY DAY  30 tablet  5  . mirtazapine (REMERON) 30 MG tablet Take 30 mg by mouth at bedtime.        . Multiple Vitamins-Minerals (CENTRUM PO) Take 1 tablet by mouth daily.        [name] venlafaxine XR (  EFFEXOR-XR) 150 MG 24 hr capsule Take 75 mg by mouth daily.        No current facility-administered medications on file prior to visit.     REVIEW OF SYSTEMS: No changes from prior visit  PHYSICAL EXAMINATION:   Vital signs are BP 124/72  Pulse 94  Ht 5\' 7"  (1.702 m)  Wt 198 lb 6.4 oz (89.994 kg)  BMI 31.07 kg/m2  SpO2 100% General: The patient appears their stated age. HEENT:  No gross abnormalities Pulmonary:  Non labored breathing Musculoskeletal: There are no major deformities. Skin: There are no ulcer or rashes noted. Psychiatric: The patient has normal affect. Cardiovascular: Spider veins behind both knees. They do not appear to be indurated. There is no erythema  Diagnostic Studies None today  Assessment: Venous insufficiency, bilateral lower  extremity Plan: I discussed with the patient that he may benefit from scheduled ibuprofen to help decrease the inflammation of the areas he is concerned about. Because he is without symptoms of swelling, and he has no significant varicosities, I would be reluctant to offer him laser ablation as he may need his saphenous vein at a later date. In addition, without swelling I do not believe he would benefit from compression stockings. If he continues to have persistent problems, he may be a candidate for sclerotherapy of the spider veins that are bothering him behind his knee. He will contact me should that occur.  Jorge Ny, M.D. [facility] [place] Office: [phone] Pager:  [phone]

## 2012-04-23 ENCOUNTER — Ambulatory Visit: Payer: Medicare Other | Admitting: *Deleted

## 2012-04-27 ENCOUNTER — Emergency Department (HOSPITAL_COMMUNITY)
Admission: EM | Admit: 2012-04-27 | Discharge: 2012-04-27 | Disposition: A | Discharge status: Home / Self Care | Payer: Medicare Other | Attending: Emergency Medicine | Admitting: Emergency Medicine

## 2012-04-27 ENCOUNTER — Encounter (HOSPITAL_COMMUNITY): Payer: Self-pay | Admitting: Emergency Medicine

## 2012-04-27 DIAGNOSIS — Z8659 Personal history of other mental and behavioral disorders: Secondary | ICD-10-CM | POA: Insufficient documentation

## 2012-04-27 DIAGNOSIS — R42 Dizziness and giddiness: Secondary | ICD-10-CM | POA: Diagnosis not present

## 2012-04-27 DIAGNOSIS — F429 Obsessive-compulsive disorder, unspecified: Secondary | ICD-10-CM | POA: Diagnosis not present

## 2012-04-27 DIAGNOSIS — R509 Fever, unspecified: Secondary | ICD-10-CM | POA: Insufficient documentation

## 2012-04-27 DIAGNOSIS — R1013 Epigastric pain: Secondary | ICD-10-CM | POA: Diagnosis not present

## 2012-04-27 DIAGNOSIS — F3289 Other specified depressive episodes: Secondary | ICD-10-CM | POA: Insufficient documentation

## 2012-04-27 DIAGNOSIS — E669 Obesity, unspecified: Secondary | ICD-10-CM | POA: Diagnosis not present

## 2012-04-27 DIAGNOSIS — Z79899 Other long term (current) drug therapy: Secondary | ICD-10-CM | POA: Diagnosis not present

## 2012-04-27 DIAGNOSIS — F609 Personality disorder, unspecified: Secondary | ICD-10-CM | POA: Insufficient documentation

## 2012-04-27 DIAGNOSIS — R300 Dysuria: Secondary | ICD-10-CM | POA: Diagnosis not present

## 2012-04-27 DIAGNOSIS — Z862 Personal history of diseases of the blood and blood-forming organs and certain disorders involving the immune mechanism: Secondary | ICD-10-CM | POA: Diagnosis not present

## 2012-04-27 DIAGNOSIS — Z8679 Personal history of other diseases of the circulatory system: Secondary | ICD-10-CM | POA: Diagnosis not present

## 2012-04-27 DIAGNOSIS — Z8669 Personal history of other diseases of the nervous system and sense organs: Secondary | ICD-10-CM | POA: Diagnosis not present

## 2012-04-27 DIAGNOSIS — R112 Nausea with vomiting, unspecified: Secondary | ICD-10-CM | POA: Diagnosis not present

## 2012-04-27 DIAGNOSIS — F329 Major depressive disorder, single episode, unspecified: Secondary | ICD-10-CM | POA: Insufficient documentation

## 2012-04-27 DIAGNOSIS — R197 Diarrhea, unspecified: Secondary | ICD-10-CM

## 2012-04-27 DIAGNOSIS — Z8639 Personal history of other endocrine, nutritional and metabolic disease: Secondary | ICD-10-CM | POA: Insufficient documentation

## 2012-04-27 DIAGNOSIS — R000 Tachycardia, unspecified: Secondary | ICD-10-CM | POA: Insufficient documentation

## 2012-04-27 DIAGNOSIS — F411 Generalized anxiety disorder: Secondary | ICD-10-CM | POA: Insufficient documentation

## 2012-04-27 DIAGNOSIS — Z8719 Personal history of other diseases of the digestive system: Secondary | ICD-10-CM | POA: Diagnosis not present

## 2012-04-27 LAB — URINALYSIS, ROUTINE W REFLEX MICROSCOPIC
Ketones, ur: >80 mg/dL — AB
Leukocytes, UA: NEGATIVE
Nitrite: NEGATIVE
pH: 7.5 (ref 5.0–8.0)

## 2012-04-27 LAB — CBC WITH DIFFERENTIAL/PLATELET
Eosinophils Absolute: 0 10*3/uL (ref 0.0–0.7)
Eosinophils Relative: 0 % (ref 0–5)
HCT: 49.6 % (ref 39.0–52.0)
Hemoglobin: 17.6 g/dL — ABNORMAL HIGH (ref 13.0–17.0)
Lymphs Abs: 0.3 10*3/uL — ABNORMAL LOW (ref 0.7–4.0)
MCH: 30.7 pg (ref 26.0–34.0)
MCHC: 35.5 g/dL (ref 30.0–36.0)
MCV: 86.6 fL (ref 78.0–100.0)
Monocytes Absolute: 0.4 10*3/uL (ref 0.1–1.0)
Monocytes Relative: 4 % (ref 3–12)
RBC: 5.73 MIL/uL (ref 4.22–5.81)

## 2012-04-27 LAB — COMPREHENSIVE METABOLIC PANEL
Alkaline Phosphatase: 57 U/L (ref 39–117)
BUN: 17 mg/dL (ref 6–23)
Calcium: 9.2 mg/dL (ref 8.4–10.5)
Creatinine, Ser: 0.86 mg/dL (ref 0.50–1.35)
GFR calc Af Amer: >90 mL/min (ref >90)
Glucose, Bld: 125 mg/dL — ABNORMAL HIGH (ref 70–99)
Potassium: 3.3 mEq/L — ABNORMAL LOW (ref 3.5–5.1)
Total Protein: 8.3 g/dL (ref 6.0–8.3)

## 2012-04-27 MED ORDER — LORAZEPAM 2 MG/ML IJ SOLN
0.5000 mg | Freq: Once | INTRAMUSCULAR | Status: AC
  Administered 2012-04-27: 0.5 mg via INTRAVENOUS
  Filled 2012-04-27: qty 1

## 2012-04-27 MED ORDER — ONDANSETRON 4 MG PO TBDP: 4.0000 mg | ORAL_TABLET | Freq: Three times a day (TID) | ORAL | Status: DC | PRN

## 2012-04-27 MED ORDER — LORAZEPAM 2 MG/ML IJ SOLN: 1.0000 mg | Freq: Once | INTRAMUSCULAR | Status: DC

## 2012-04-27 MED ORDER — SODIUM CHLORIDE 0.9 % IV BOLUS (SEPSIS)
1000.0000 mL | Freq: Once | INTRAVENOUS | Status: AC
  Administered 2012-04-27: 1000 mL via INTRAVENOUS

## 2012-04-27 MED ORDER — ONDANSETRON HCL 4 MG/2ML IJ SOLN
4.0000 mg | Freq: Once | INTRAMUSCULAR | Status: AC
  Administered 2012-04-27: 4 mg via INTRAVENOUS
  Filled 2012-04-27: qty 2

## 2012-04-27 NOTE — ED Notes (Signed)
Pt escorted to discharge window. Pt verbalized understanding discharge instructions. In no acute distress.  

## 2012-04-27 NOTE — ED Notes (Signed)
PA @ bedside.

## 2012-04-27 NOTE — ED Notes (Addendum)
Pt c/o n/v/d and fever onset around midnight last night. Pt reports large amount of v/d. Pt also reports dizziness. Pt states he took Motrin 400mg  at 0200. Pt also reports SHOB when walking.

## 2012-04-27 NOTE — ED Provider Notes (Signed)
Medical screening examination/treatment/procedure(s) were performed by non-physician practitioner and as supervising physician I was immediately available for consultation/collaboration.  Olivia Mackie, MD 04/27/12 [phone]

## 2012-04-27 NOTE — ED Provider Notes (Signed)
History     CSN: [id number]  Arrival date & time 04/27/12  4098   First MD Initiated Contact with Patient 04/27/12 [phone]      Chief Complaint  Patient presents with  . Emesis  . Diarrhea    (Consider location/radiation/quality/duration/timing/severity/associated sxs/prior treatment) HPI Comments: Patient presents with a chief complaint of nausea, vomiting, and diarrhea.  Symptoms began last evening.  He reports that he has had approximately eight episodes of vomiting and eight episodes of diarrhea.  He denies any blood in the stool or blood in the emesis.  He reports mild epigastric discomfort, but no real pain.  He states that he has had a low grade fever.  Tmax 100.5 F orally.  He denies any past abdominal surgeries.  No known sick contacts.  Patient is a 32 y.o. male presenting with vomiting and diarrhea. The history is provided by the patient.  Emesis Quality:  Stomach contents and bilious material Relieved by:  None tried Ineffective treatments:  None tried Associated symptoms: chills and diarrhea   Associated symptoms: no headaches   Risk factors: no diabetes, no prior abdominal surgery, no sick contacts and no suspect food intake   Diarrhea Associated symptoms: chills and vomiting   Associated symptoms: no headaches     Past Medical History  Diagnosis Date  . Anxiety   . OCD (obsessive compulsive disorder)   . ADD (attention deficit disorder with hyperactivity)     oppositional defiant disorder of adolescence  . Personality disorder     with schizoid features  . Tourette's     per [facility]  . Insomnia   . HTN (hypertension)     borderline  . Obesity   . Allergic rhinitis   . Glucose intolerance (impaired glucose tolerance)   . GERD (gastroesophageal reflux disease)   . Vitamin D deficiency   . Depression     Past Surgical History  Procedure Laterality Date  . Adnoidectomy  1992    Family History  Problem Relation Age of Onset  . Cholelithiasis  Maternal Grandmother   . Diabetes Maternal Grandmother   . Prostate cancer Maternal Grandfather   . Colon polyps Maternal Grandfather     History  Substance Use Topics  . Smoking status: Never Smoker   . Smokeless tobacco: Never Used  . Alcohol Use: No      Review of Systems  Constitutional: Positive for chills.       Low grade fever  Gastrointestinal: Positive for nausea, vomiting and diarrhea. Negative for constipation, blood in stool, abdominal distention and anal bleeding.       Epigastric discomfort  Genitourinary: Positive for dysuria. Negative for urgency and frequency.  Neurological: Positive for light-headedness. Negative for syncope and headaches.  All other systems reviewed and are negative.    Allergies  Honey bee treatment; Quetiapine; Penicillins; and Sulfa antibiotics  Home Medications   Current Outpatient Rx  Name  Route  Sig  Dispense  Refill  . ARIPiprazole (ABILIFY) 5 MG tablet   Oral   Take 5 mg by mouth at bedtime.         . mirtazapine (REMERON) 30 MG tablet   Oral   Take 30 mg by mouth at bedtime.           . Multiple Vitamin (MULTIVITAMIN WITH MINERALS) TABS   Oral   Take 1 tablet by mouth every morning.         . venlafaxine XR (EFFEXOR-XR) 150 MG 24 hr capsule  Oral   Take 150 mg by mouth at bedtime.            BP 108/57  Pulse 116  Temp(Src) 99.3 F (37.4 C) (Oral)  Resp 16  Ht 5\' 7"  (1.702 m)  Wt 195 lb (88.451 kg)  BMI 30.53 kg/m2  SpO2 99%  Physical Exam  Nursing note and vitals reviewed. Constitutional: He appears well-developed and well-nourished. No distress.  HENT:  Head: Normocephalic and atraumatic.  Mouth/Throat: Uvula is midline. Mucous membranes are dry.  Neck: Normal range of motion. Neck supple.  Cardiovascular: Regular rhythm and normal heart sounds.  Tachycardia present.   Pulmonary/Chest: Effort normal and breath sounds normal.  Abdominal: Soft. Bowel sounds are normal. He exhibits no distension  and no mass. There is no tenderness. There is no rebound and no guarding.  Musculoskeletal: Normal range of motion.  Neurological: He is alert.  Skin: Skin is warm and dry. He is not diaphoretic.  Psychiatric: He has a normal mood and affect.    ED Course  Procedures (including critical care time)  Labs Reviewed  CBC WITH DIFFERENTIAL - Abnormal; Notable for the following:    Hemoglobin 17.6 (*)    Neutrophils Relative 93 (*)    Neutro Abs 9.7 (*)    Lymphocytes Relative 3 (*)    Lymphs Abs 0.3 (*)    All other components within normal limits  COMPREHENSIVE METABOLIC PANEL - Abnormal; Notable for the following:    Potassium 3.3 (*)    Glucose, Bld 125 (*)    Total Bilirubin 1.3 (*)    All other components within normal limits  URINALYSIS, ROUTINE W REFLEX MICROSCOPIC   No results found.   No diagnosis found.  7:50 AM Reassessed patient.  Patient reports that his nausea has improved.  Patient continues to be tachycardic.  Will given another 1 Liter IVF and fluid challenge.  9:04 AM Patient continues to be tachycardic after given 2 Liters of IVF.  Patient appears to be anxious.  Will order Ativan.  Patient refuses to have any more IV fluids.  Patient tolerating PO fluids.    Patient continues to be tachycardic, but is requesting to be discharged.  MDM  Patient presenting with nausea, vomiting, and diarrhea, which began last evening.  No abdominal pain on exam.  Labs unremarkable.  Patient given IVF and IV Zofran and symptoms improved.  Symptoms most consistent with Viral Gastroenteritis.   Patient discharged home with Rx for Zofran.  Return precautions discussed.        Pascal Lux [place], PA-C 04/27/12 1022

## 2012-04-27 NOTE — ED Notes (Signed)
Pt alert and oriented x4. Respirations even and unlabored, bilateral symmetrical rise and fall of chest. Skin warm and dry. In no acute distress. Denies needs.   

## 2012-04-27 NOTE — ED Notes (Signed)
Pt tolerating PO fluids, PA alerted. Pt reports ativan gave no relief. Pt requesting to be discharged. PA alerted.

## 2012-05-03 ENCOUNTER — Ambulatory Visit (INDEPENDENT_AMBULATORY_CARE_PROVIDER_SITE_OTHER): Payer: Medicare Other | Admitting: Psychiatry

## 2012-05-03 ENCOUNTER — Encounter (HOSPITAL_COMMUNITY): Payer: Self-pay | Admitting: Psychiatry

## 2012-05-03 VITALS — BP 116/64 | HR 77 | Wt 191.8 lb

## 2012-05-03 DIAGNOSIS — F429 Obsessive-compulsive disorder, unspecified: Secondary | ICD-10-CM | POA: Diagnosis not present

## 2012-05-03 DIAGNOSIS — F411 Generalized anxiety disorder: Secondary | ICD-10-CM | POA: Diagnosis not present

## 2012-05-03 DIAGNOSIS — Q898 Other specified congenital malformations: Secondary | ICD-10-CM

## 2012-05-03 MED ORDER — VENLAFAXINE HCL ER 37.5 MG PO CP24: 37.5000 mg | ORAL_CAPSULE | Freq: Every day | ORAL | Status: DC

## 2012-05-03 MED ORDER — FLUVOXAMINE MALEATE 25 MG PO TABS: ORAL_TABLET | ORAL | Status: DC

## 2012-05-03 NOTE — Progress Notes (Signed)
Patient ID: Bruce Ellison, male   DOB: May 14, 1980, 32 y.o.   MRN: [id number]  Chief complaint. I want to establish my care in this office.  I'm not happy with my current provider. I have a lot of anxiety and OCD symptoms.  History of presenting illness. Patient is 32 year old [place] American, unemployed single male who is self-referred for seeking treatment.  Patient has a long history of psychiatric illness and currently seeing at [facility] for his psychiatric care.  Patient has history of OCD, anxiety disorder and Tourette.  Patient is not happy with the treatment there.  Patient experiencing increased symptoms of anxiety nervousness panic attack and OCD however his medication management at [facility] does not improve the symptoms.  Patient do not know the triggers however experiencing increased counting checking and tapping his foot.  He endorse counting numbers 1-8 at the same time tapping his foot, he is checking his lights and lock very frequently.  He also endorse hearing his own thoughts more frequently and recently experiencing increased anxiety and panic attack.  He sleeping on and off.  He endorse bad dreams but denies any aggression or violence.  He is taking Effexor 150 milligrams , Abilify 5 mg and Remeron 30 mg.  When he discuss the symptoms to his provider his Effexor was increased from 75 mg to 150 however patient does not see any improvement.  He tried Abilify 10 mg but it make him more tremulous and anxious.  He also tried Remeron 45 mg but he started to have urinary retention.  Patient does not feel comfortable and like to try a different medication for his OCD and anxiety symptoms.  Patient denies any paranoia or any hallucination.  He sleeping on and off.  He endorse decreased attention and concentration irritability and sometime loss of interest.  Patient has a limited social network.  He endorse chronic feeling of depression but denies any active or passive suicidal thoughts.  He endorse that  his panic attack sometime last for half an hour which he described shortness of breath, sweaty palms and dizziness.  Recently he has GI symptoms and seen in emergency room for nausea and diarrhea.  He was given Zofran and Ativan 2 mg but he has not taken the Ativan or Zofran.  He was given Vistaril but he is scared to take any medicine that make him very busy.  He denies any active or passive suicidal thoughts.  Past psychiatric history. Patient endorse history of psychiatric illness started at age 32.  At that time he was in [place].  He has taken medication from age [age] in [place].  From age 32 to age 65 patient was not taking any medication however he started to notice increase Tics, anxiety, paranoia and anger.  He was seeing the doctor at [facility] and [facility].  He start taking medication at age 32.  In the beginning he was getting Seroquel but he developed allergic reaction with Seroquel.  He start seeing psychiatrist at [facility] in 2002.  He had tried Geodon, Risperdal but did not see any good response.  Finally he start taking Abilify which helped his Tics and irritability.  He also seeing Dr. Milus Height, Dr. Evelene Croon in the past.  Patient denies any history of suicidal attempt or any inpatient psychiatric treatment.  Patient denies any history of hallucination but endorse irritability mood swing and anger.  He remembered taking Zoloft, Valium, Klonopin, Xanax, Prozac and Ambien in the past.  Patient denies any history  of mania or any aggression.    Psychosocial history. Patient was born in [place] [address] [place].  At age 32 his parents got divorced.  Patient moved the [place] at age 32.  Patient is living with his mother and grandparents.  Patient's father still lives in [place].  Patient has no contact with his father.  Patient has never married.  He has no children.  He has a limited social network.    Family history. Patient endorse father has history of anger problem.    Alcohol and  substance use history. Patient denies any history of alcohol use or any illegal substance use.  History of abuse. Patient denies any history of physical sexual abuse.  Education and work history. Patient has a college education from [employer].  He remembered not doing very well in high school in [place].  He has significant difficulty in attention concentration and irritability at that time.  He dropout from the college.  Patient also remember attention and learning difficulties in [place] in his early school age.  Medical history. Her primary care physician is Dr. Scarlette Calico.  He has obstructive sleep apnea, supportive dermatitis, obesity I diagnosed with glucose intolerance.  He is not taking any medication for his medical issues which he believe they are not active.  Review of Systems  HENT: Negative.   Eyes: Negative.   Skin: Negative.   Neurological: Negative.   Psychiatric/Behavioral: Positive for depression. Negative for suicidal ideas, hallucinations and substance abuse. The patient is nervous/anxious and has insomnia.     Mental status examination. Patient is a young man who is appears to be in his his stated age.  He is casually dressed and fairly groomed.  He appears shy and maintained fair eye contact.  His his speech is slow but clear and coherent.  He is appropriate in conversation.  His thought processes slow but logical linear and goal-directed.  He denies any active or passive suicidal thoughts or homicidal thoughts.  He denies any auditory or visual hallucination.  He endorse obsessive thoughts about counting, checking and numbers.  There were no paranoia or delusion present at this time.  He has no flight of ideas or any loose association.  His fund of knowledge is adequate.  He's alert and oriented x3.  He has mild psychomotor activity increased.  His insight judgment and impulse control is okay.  Assessment Axis I obsessive-compulsive disorder, Tourette's syndrome, anxiety disorder  NOS Axis II deferred Axis III see medical history Axis IV mild to moderate Axis V 50-55  Plan I reviewed his symptoms, current medication and psychosocial stressors.  At this time patient is experiencing symptoms of OCD and anxiety.  He does not feel his current medication is working very well.  I recommend to decrease Effexor and eventually stop after one week and be with a start fluoxamine for his anxiety and OCD symptoms.  We will start 25 mg fluoxamine daily for one week and gradually increase to 50 mg daily.  I will continue Abilify and Remeron at present does.  I recommend to use Vistaril 25 mg for severe panic attack.  Patient has tried benzodiazepine in the past with poor outcome.  Risk and benefit explain in detail.  Recommend to call us back if he is any question or concern feel worsening of the symptom.  I will also recommend to schedule him to see therapist in this office for coping and social skills.  We will get collateral information from more marked in his primary  care physician.  I will see him again in 2-3 weeks.  Time spent 60 minutes.

## 2012-05-16 ENCOUNTER — Encounter (HOSPITAL_COMMUNITY): Payer: Self-pay | Admitting: Psychiatry

## 2012-05-16 ENCOUNTER — Ambulatory Visit (INDEPENDENT_AMBULATORY_CARE_PROVIDER_SITE_OTHER): Payer: Medicare Other | Admitting: Psychiatry

## 2012-05-16 VITALS — BP 139/70 | HR 93 | Wt 199.9 lb

## 2012-05-16 DIAGNOSIS — F952 Tourette's disorder: Secondary | ICD-10-CM

## 2012-05-16 DIAGNOSIS — F429 Obsessive-compulsive disorder, unspecified: Secondary | ICD-10-CM

## 2012-05-16 MED ORDER — VENLAFAXINE HCL ER 150 MG PO CP24: 150.0000 mg | ORAL_CAPSULE | Freq: Every day | ORAL | Status: DC

## 2012-05-16 MED ORDER — MIRTAZAPINE 30 MG PO TABS: 30.0000 mg | ORAL_TABLET | Freq: Every day | ORAL | Status: DC

## 2012-05-16 NOTE — Progress Notes (Signed)
[facility] [address] Progress Note  Bruce Ellison [id number] 32 y.o.  05/16/2012 1:52 PM  Chief Complaint: I stop taking luvox. I have gained weight in am feeling more nervous and anxious.  History of Present Illness: Patient is 32 year old [place] American unemployed single male who came for his followup appointment.  Patient was seen first time on February 28.  He was seeing [facility] but he was not happy there.  We started him on Luvox and hydroxyzine for panic attack but patient does not like Luvox he started to feel more anxious irritable with lack of sleep and apparently gain weight.  He wants to restart his Effexor extended release 150 mg.  Initially he thought that Effexor was not helping however now he realized that it was working.  He still has symptoms of severe anxiety, obsession and compulsion.  He continues to count from 1-8 in the same time tapping his foot.  He denies any recent panic attack but admitted social isolation and lack of motivation to do things.  He also endorse decreased ADLs and does not feel comfortable around people.  He scheduled to see therapist on March 25.  Patient has a appointment with [facility] however he like to cancel that appointment and want to continue care in this office.  He's not drinking or using any illegal substance.  He denies any aggression or any violence.  Suicidal Ideation: No Plan Formed: No Patient has means to carry out plan: No  Homicidal Ideation: No Plan Formed: No Patient has means to carry out plan: No  Review of Systems: Psychiatric: Agitation: No Hallucination: No Depressed Mood: Yes Insomnia: Yes Hypersomnia: No Altered Concentration: No Feels Worthless: No Grandiose Ideas: No Belief In Special Powers: No New/Increased Substance Abuse: No Compulsions: Counting numbers from 1-8 and at the same time tapping his feet  Neurologic: Headache: No Seizure: No Paresthesias: No  Past Medical History:  Patient has  psychiatric treatment in the past.  He was diagnosed with OCD and tic disorder .   His primary care physician is Dr. Carlisle Cater.  He has obstructive sleep apnea, dermatitis, obesity and he was also diagnosed with glucose intolerance.  However he is not taking any medication.    Social history.   Patient was born in [place].  At age 33 his parents got divorced.  He moved the [place] at age 64.  He is living with his mother and grandparents.  Patient father still lives in [place].  Patient has no contact with his father.  Patient has never married.  He has no children.  He has limited social network.  Outpatient Encounter Prescriptions as of 05/16/2012  Medication Sig Dispense Refill  . ARIPiprazole (ABILIFY) 5 MG tablet Take 5 mg by mouth at bedtime.      [name] desonide (DESOWEN) 0.05 % cream       . mirtazapine (REMERON) 30 MG tablet Take 1 tablet (30 mg total) by mouth at bedtime.  30 tablet  0  . Multiple Vitamin (MULTIVITAMIN WITH MINERALS) TABS Take 1 tablet by mouth every morning.      . ondansetron (ZOFRAN ODT) 4 MG disintegrating tablet Take 1 tablet (4 mg total) by mouth every 8 (eight) hours as needed for nausea.  20 tablet  0  . venlafaxine XR (EFFEXOR-XR) 150 MG 24 hr capsule Take 1 capsule (150 mg total) by mouth at bedtime.  30 capsule  0  . [DISCONTINUED] fluvoxaMINE (LUVOX) 25 MG tablet Take 1 tab daily for 1  week and than 2 tab daily  60 tablet  0  . [DISCONTINUED] hydrOXYzine (VISTARIL) 25 MG capsule       . [DISCONTINUED] mirtazapine (REMERON) 30 MG tablet Take 30 mg by mouth at bedtime.        . [DISCONTINUED] venlafaxine XR (EFFEXOR-XR) 37.5 MG 24 hr capsule Take 1 capsule (37.5 mg total) by mouth at bedtime.  30 capsule  0   No facility-administered encounter medications on file as of 05/16/2012.    Past Psychiatric History/Hospitalization(s): Anxiety: Patient has history of psychiatric illness at age 39.  At that time he was in [place].  He has Tic, anxiety and  anger. Bipolar Disorder: No Depression: Yes, he had tried Zoloft, Prozac and recently Luvox. Mania: No Psychosis: Patient has history of paranoia.  In the past he had tried Geodon, Risperdal and Seroquel.  He was seeing psychiatrist at [facility].  In the past he was also to get treatment in [place].  He was seeing psychiatrist in [place] in [place].  He's been taking the medication since age 47.  Recently his been seeing Dr. Evelene Croon who had prescribed her Zoloft, Valium, Klonopin, Xanax, Prozac and Ambien. Schizophrenia: No Personality Disorder: No Hospitalization for psychiatric illness: No History of Electroconvulsive Shock Therapy: No Prior Suicide Attempts: No  Physical Exam: Constitutional:  BP 139/70  Pulse 93  Wt 199 lb 14.4 oz (90.674 kg)  BMI 31.3 kg/m2  General Appearance: alert, oriented, no acute distress and Poorly groomed.  He is shy and maintained poor eye contact.  Musculoskeletal: Strength & Muscle Tone: within normal limits Gait & Station: normal Patient leans: N/A  Psychiatric: Speech (describe rate, volume, coherence, spontaneity, and abnormalities if any): Slow but clear and coherent.  Thought Process (describe rate, content, abstract reasoning, and computation): Slow but logical and goal-directed.  Associations: Relevant, Intact and Obsessive thoughts.  Thoughts: obsessions and Obsessive thoughts but no paranoia or delusion or grandiosity.  Mental Status: Orientation: oriented to person and place Mood & Affect: anxiety and Constricted affect. Attention Span & Concentration: Poor  Medical Decision Making (Choose Three): New problem, with additional work up planned, Review of Psycho-Social Stressors (1), Review and summation of old records (2), Established Problem, Worsening (2), Review of Medication Regimen & Side Effects (2) and Review of New Medication or Change in Dosage (2)  Assessment: Axis I: Obsessive-compulsive disorder,  Tourette's syndrome, Tic disorder, anxiety disorder NOS  Axis II: Deferred  Axis III: See medical history Patient Active Problem List  Diagnosis  . VITAMIN D DEFICIENCY  . GLUCOSE INTOLERANCE  . ANXIETY  . PANIC ATTACK  . ALLERGIC RHINITIS  . GERD  . CELLULITIS/ABSCESS, FOOT  . SEBORRHEIC DERMATITIS  . BACK PAIN  . FREQUENCY, URINARY  . Cerumen impaction  . Polycythemia  . OSA (obstructive sleep apnea)  . Varicose veins of lower extremities with other complications    Axis IV: Moderate  Axis V: 50- 55   Plan:  I will discontinue Luvox and Vistaril.  Patient developed increased anxiety and irritability and lack of sleep with Luvox.  He does not feel hydroxyzine help his panic attack.  He wants to restart Effexor 150 mg daily.  We will continue Abilify 5 mg and Remeron 30 mg at bedtime.  I have reviewed the records from Dr. Evelene Croon who he has seen from February, 2009 05 January 2009.  He has given Zoloft, Lexapro, Dalmane, Valium, Klonopin and Xanax.  He was also taking Prozac which worked for  some time but it stopped working.  Patient is scheduled to see therapist on March 25.  I recommend to continue his medication.  Recommend to call us back if he is any question or concern if he feel worsening of the symptom.  It like to continue his treatment in this office.  I will see him again in 4 weeks.  Time spent 25 minutes.  More than 50% of the time spent in psycho education, counseling and coordination of care .  [name],SYED T., MD 05/16/2012

## 2012-05-28 ENCOUNTER — Encounter (HOSPITAL_COMMUNITY): Payer: Self-pay | Admitting: Licensed Clinical Social Worker

## 2012-05-28 ENCOUNTER — Ambulatory Visit (INDEPENDENT_AMBULATORY_CARE_PROVIDER_SITE_OTHER): Payer: Medicaid Other | Admitting: Licensed Clinical Social Worker

## 2012-05-28 DIAGNOSIS — F411 Generalized anxiety disorder: Secondary | ICD-10-CM | POA: Diagnosis not present

## 2012-05-28 DIAGNOSIS — F339 Major depressive disorder, recurrent, unspecified: Secondary | ICD-10-CM | POA: Diagnosis not present

## 2012-05-28 DIAGNOSIS — F332 Major depressive disorder, recurrent severe without psychotic features: Secondary | ICD-10-CM | POA: Insufficient documentation

## 2012-05-28 NOTE — Progress Notes (Signed)
Patient ID: Talis Iwan, male   DOB: [birth year], 32 y.o.   MRN: [id number] Patient:   Bruce Ellison   DOB:   [birth year]  MR Number:  [id number]  Location:  [facility] [place] [address] [id number] [place] [address] Dept: [phone]           Date of Service:   05/28/2012  Start Time:   10:30am End Time:   11:20am  Provider/Observer:  Geanie Berlin LCSW       Billing Code/Service: [phone]  Chief Complaint:     Chief Complaint  Patient presents with  . Anxiety    poor focus, racing heart, agitated with care taking of grand mother, OCD-checks car, locks and doors   . Depression    umotivated, anhedonia, isolation, fatigue, sleep 8-9 hours, appetite increased,     Reason for Service:  Patient is referred by Dr. Lolly Mustache for the treatment of depression, anxiety and OCD.   Current Status:  He presents with depressed mood and anxious affect. He reports mild improvement in his depression since his medication has been changed. He reports a long history of depression and anxiety, since he was a teenager, about the time his family emigrated to this country. He reports current anhedonia, poor motivation, fatigue, feelings of sadness, hopelessness, isolation, no friends, no socialization at all outside the home, with increased appetite with weight gain and average sleep. He denies any AH, VH or paranoia. He has no prior suicide attempts and denies any current suicidal or homicidal ideation, intent or plan. He was diagnosed with Tourretes Syndrome as a child in [place] and he endorses a history of aggression towards property and people, but he does not struggle with aggression since he was a teenager. He lives with his mother and grandparents and takes care of his bed ridden grandmother. He does not leave the home often, has no friends because he has lost contact due to his depression. It is difficult for him to focus and concentrate.  He has a history of panic attacks, but reports that they have improved and that his anxiety is generalized.   Reliability of Information: good  Behavioral Observation: Bruce Ellison  presents as a 32 y.o.-year-old  [place] Male who appeared his stated age. his dress was Appropriate and he was Well Groomed and his manners were Appropriate to the situation.  There were not any physical disabilities noted.  he displayed an appropriate level of cooperation and motivation.    Interactions:    Active   Attention:   within normal limits  Memory:   within normal limits  Visuo-spatial:   within normal limits  Speech (Volume):  normal  Speech:   normal pitch and normal volume  Thought Process:  Coherent and Relevant  Though Content:  WNL  Orientation:   person, place and time/date  Judgment:   Good  Planning:   Good  Affect:    Anxious and Depressed  Mood:    Anxious and Depressed  Insight:   Good  Intelligence:   normal  Marital Status/Living: Lives with mother and grandparents. Father is in [place]. Never been married, no children.   Current Employment: On disability since 2006. Used to work Clinical biochemist. Unable to concentrate and strong panic attacks.   Past Employment:  Used to work at [employer], [employer]. Misses working, but does not feel he can work at all.   Substance Use:  No concerns of substance abuse are  reported.    Education:   Attended [employer] for one year, but dropped out due to vocal tics. College went to [employer]. Took five years due to panic attacks and depression.   Medical History:   Past Medical History  Diagnosis Date  . Anxiety   . OCD (obsessive compulsive disorder)   . Personality disorder     with schizoid features  . Tourette's     per [facility]  . Insomnia   . HTN (hypertension)     borderline  . Obesity   . Allergic rhinitis   . Glucose intolerance (impaired glucose  tolerance)   . GERD (gastroesophageal reflux disease)   . Vitamin D deficiency   . Depression         Outpatient Encounter Prescriptions as of 05/28/2012  Medication Sig Dispense Refill  . ARIPiprazole (ABILIFY) 5 MG tablet Take 5 mg by mouth at bedtime.      [name] desonide (DESOWEN) 0.05 % cream       . mirtazapine (REMERON) 30 MG tablet Take 1 tablet (30 mg total) by mouth at bedtime.  30 tablet  0  . Multiple Vitamin (MULTIVITAMIN WITH MINERALS) TABS Take 1 tablet by mouth every morning.      . ondansetron (ZOFRAN ODT) 4 MG disintegrating tablet Take 1 tablet (4 mg total) by mouth every 8 (eight) hours as needed for nausea.  20 tablet  0  . venlafaxine XR (EFFEXOR-XR) 150 MG 24 hr capsule Take 1 capsule (150 mg total) by mouth at bedtime.  30 capsule  0   No facility-administered encounter medications on file as of 05/28/2012.          Sexual History:   History  Sexual Activity  . Sexually Active: Not Currently    Abuse/Trauma History: No trauma history.   Psychiatric History:  No hospitalizations. Outpatient treatment at [facility] and Dr. Milus Height. Specialists at [facility] for vocal tics. Diagnosed with Tourettes in [place].   Family Med/Psych History:  Family History  Problem Relation Age of Onset  . Cholelithiasis Maternal Grandmother   . Diabetes Maternal Grandmother   . Prostate cancer Maternal Grandfather   . Colon polyps Maternal Grandfather     Risk of Suicide/Violence: low   Impression/DX:  MDD recurrent without psychotic features, Generalized Anxiety Disorder   Disposition/Plan:  Weekly treatment to address depression and anxiety, to build coping strategies and to participate in the world.   Diagnosis:    Axis I:  MDD recurrent without psychotic features, Generalized Anxiety Disorder       Axis II: Deferred       Axis III:  none      Axis IV:  economic problems, occupational problems, other psychosocial or environmental problems, problems related to social  environment and problems with primary support group          Axis V:  51-60 moderate symptoms

## 2012-06-03 ENCOUNTER — Telehealth: Payer: Self-pay

## 2012-06-03 NOTE — Telephone Encounter (Signed)
Pt called requesting a referral to [facility] for persistent rhinitis sxs with sore throat, please advise.

## 2012-06-04 ENCOUNTER — Ambulatory Visit (INDEPENDENT_AMBULATORY_CARE_PROVIDER_SITE_OTHER): Payer: Medicare Other | Admitting: Internal Medicine

## 2012-06-04 ENCOUNTER — Other Ambulatory Visit (INDEPENDENT_AMBULATORY_CARE_PROVIDER_SITE_OTHER): Payer: Medicare Other

## 2012-06-04 ENCOUNTER — Telehealth: Payer: Self-pay | Admitting: Internal Medicine

## 2012-06-04 ENCOUNTER — Encounter: Payer: Self-pay | Admitting: Internal Medicine

## 2012-06-04 VITALS — BP 120/70 | HR 80 | Temp 98.1°F | Resp 16 | Wt 202.0 lb

## 2012-06-04 DIAGNOSIS — L219 Seborrheic dermatitis, unspecified: Secondary | ICD-10-CM

## 2012-06-04 DIAGNOSIS — J309 Allergic rhinitis, unspecified: Secondary | ICD-10-CM

## 2012-06-04 DIAGNOSIS — R739 Hyperglycemia, unspecified: Secondary | ICD-10-CM | POA: Insufficient documentation

## 2012-06-04 DIAGNOSIS — R635 Abnormal weight gain: Secondary | ICD-10-CM | POA: Insufficient documentation

## 2012-06-04 DIAGNOSIS — J329 Chronic sinusitis, unspecified: Secondary | ICD-10-CM | POA: Diagnosis not present

## 2012-06-04 DIAGNOSIS — F411 Generalized anxiety disorder: Secondary | ICD-10-CM

## 2012-06-04 DIAGNOSIS — R7309 Other abnormal glucose: Secondary | ICD-10-CM

## 2012-06-04 DIAGNOSIS — G4733 Obstructive sleep apnea (adult) (pediatric): Secondary | ICD-10-CM

## 2012-06-04 LAB — BASIC METABOLIC PANEL
Calcium: 9.2 mg/dL (ref 8.4–10.5)
Chloride: 104 mEq/L (ref 96–112)
Creatinine, Ser: 0.9 mg/dL (ref 0.4–1.5)

## 2012-06-04 LAB — HEMOGLOBIN A1C: Hgb A1c MFr Bld: 5.3 % (ref 4.6–6.5)

## 2012-06-04 LAB — TSH: TSH: 1.13 u[IU]/mL (ref 0.35–5.50)

## 2012-06-04 MED ORDER — DOXYCYCLINE HYCLATE 100 MG PO TABS: 100.0000 mg | ORAL_TABLET | Freq: Two times a day (BID) | ORAL | Status: DC

## 2012-06-04 MED ORDER — FLUTICASONE PROPIONATE 50 MCG/ACT NA SUSP: 2.0000 | Freq: Every day | NASAL | Status: DC

## 2012-06-04 MED ORDER — CLOTRIMAZOLE-BETAMETHASONE 1-0.05 % EX CREA: TOPICAL_CREAM | Freq: Two times a day (BID) | CUTANEOUS | Status: DC

## 2012-06-04 NOTE — Assessment & Plan Note (Signed)
Doxy x 10 d 

## 2012-06-04 NOTE — Assessment & Plan Note (Signed)
A1c Diet discussed

## 2012-06-04 NOTE — Assessment & Plan Note (Signed)
Flonase, Claritin Allergy ref Dr [place] Callas

## 2012-06-04 NOTE — Assessment & Plan Note (Signed)
Lotrisone 

## 2012-06-04 NOTE — Telephone Encounter (Signed)
Pt wants to know if the doxycycline is for the rash and for the sinus infection.  Or will he need a different antibiotic for the sinus?

## 2012-06-04 NOTE — Telephone Encounter (Signed)
For the sinus infection Thx

## 2012-06-04 NOTE — Telephone Encounter (Signed)
Pt seen by AVP 06/04/2012

## 2012-06-04 NOTE — Assessment & Plan Note (Signed)
Not using the mask - discussed

## 2012-06-04 NOTE — Assessment & Plan Note (Signed)
4/14 du to ?Luvox

## 2012-06-05 ENCOUNTER — Ambulatory Visit (HOSPITAL_COMMUNITY): Payer: Self-pay | Admitting: Licensed Clinical Social Worker

## 2012-06-05 NOTE — Telephone Encounter (Signed)
Pt is aware.  

## 2012-06-06 ENCOUNTER — Telehealth: Payer: Self-pay | Admitting: Internal Medicine

## 2012-06-06 NOTE — Progress Notes (Signed)
Patient ID: Bruce Ellison, male   DOB: Jul 25, 1980, 32 y.o.   MRN: [id number]   Subjective:    Patient ID: Bruce Ellison, male    DOB: 11-06-1980, 32 y.o.   MRN: [id number]  Sore Throat  This is a new problem. The current episode started in the past 7 days. There has been no fever. The pain is mild.    Wt Readings from Last 3 Encounters:  06/04/12 202 lb (91.627 kg)  05/16/12 199 lb 14.4 oz (90.674 kg)  05/03/12 191 lb 12.8 oz (87 kg)   BP Readings from Last 3 Encounters:  06/04/12 120/70  05/16/12 139/70  05/03/12 116/64     BP 120/70  Pulse 80  Temp(Src) 98.1 F (36.7 C) (Oral)  Resp 16  Wt 202 lb (91.627 kg)  BMI 31.63 kg/m2   Review of Systems  Constitutional: Positive for fatigue and unexpected weight change (lost wt). Negative for diaphoresis, activity change and appetite change.  HENT: Negative for facial swelling, mouth sores, neck stiffness, voice change and sinus pressure.   Eyes: Negative.   Cardiovascular: Negative for palpitations and leg swelling.  Gastrointestinal: Negative for nausea and constipation.  Genitourinary: Negative for dysuria, urgency, decreased urine volume, enuresis and difficulty urinating.  Musculoskeletal: Negative for back pain, joint swelling, arthralgias and gait problem.  Skin: Negative for color change, pallor and wound.  Neurological: Positive for dizziness. Negative for tremors, seizures, syncope, facial asymmetry, speech difficulty, weakness and light-headedness.  Hematological: Negative for adenopathy. Does not bruise/bleed easily.  Psychiatric/Behavioral: Negative.        Objective:   Physical Exam  Vitals reviewed. Constitutional: He is oriented to person, place, and time. He appears well-developed and well-nourished. No distress.  HENT:  Head: No trismus in the jaw.  Right Ear: Hearing, tympanic membrane, external ear and ear canal normal.  Left Ear: Hearing, tympanic membrane, external ear and ear canal normal.  Nose: Nose  normal. No mucosal edema, rhinorrhea, nose lacerations, sinus tenderness, nasal deformity, septal deviation or nasal septal hematoma. No epistaxis.  No foreign bodies. Right sinus exhibits no maxillary sinus tenderness and no frontal sinus tenderness. Left sinus exhibits no maxillary sinus tenderness and no frontal sinus tenderness.  Mouth/Throat: Mucous membranes are normal. Mucous membranes are not pale, not dry and not cyanotic. No edematous. Posterior oropharyngeal erythema present. No oropharyngeal exudate, posterior oropharyngeal edema or tonsillar abscesses.  Eyes: Conjunctivae are normal. Right eye exhibits no discharge. Left eye exhibits no discharge. No scleral icterus.  Neck: Normal range of motion. Neck supple. No JVD present. No tracheal deviation present. No thyromegaly present.  Cardiovascular: Normal rate, regular rhythm, normal heart sounds and intact distal pulses.  Exam reveals no gallop and no friction rub.   No murmur heard. Pulmonary/Chest: Effort normal and breath sounds normal. No stridor. No respiratory distress. He has no wheezes. He has no rales. He exhibits no tenderness.  Abdominal: Soft. Bowel sounds are normal. He exhibits no distension. There is no tenderness. There is no rebound and no guarding.  Musculoskeletal: Normal range of motion. He exhibits no edema and no tenderness.  Lymphadenopathy:    He has no cervical adenopathy.  Neurological: He is oriented to person, place, and time.  Skin: Skin is warm and dry. No rash noted. He is not diaphoretic. No erythema. No pallor.  Psychiatric: He has a normal mood and affect. His behavior is normal. Judgment and thought content normal.  Face w/SD rash       Assessment & Plan:

## 2012-06-06 NOTE — Telephone Encounter (Signed)
Patient has questions about his allergy medication, says the claritin is not helping and wants to know if he could take allegra?

## 2012-06-06 NOTE — Telephone Encounter (Signed)
Pt informed ok to try Allegra instead of Claritin.

## 2012-06-08 ENCOUNTER — Emergency Department (HOSPITAL_COMMUNITY): Payer: Medicare Other

## 2012-06-08 ENCOUNTER — Encounter (HOSPITAL_COMMUNITY): Payer: Self-pay | Admitting: *Deleted

## 2012-06-08 ENCOUNTER — Emergency Department (HOSPITAL_COMMUNITY)
Admission: EM | Admit: 2012-06-08 | Discharge: 2012-06-08 | Disposition: A | Discharge status: Home / Self Care | Payer: Medicare Other | Attending: Emergency Medicine | Admitting: Emergency Medicine

## 2012-06-08 DIAGNOSIS — Z8659 Personal history of other mental and behavioral disorders: Secondary | ICD-10-CM | POA: Diagnosis not present

## 2012-06-08 DIAGNOSIS — R21 Rash and other nonspecific skin eruption: Secondary | ICD-10-CM | POA: Diagnosis not present

## 2012-06-08 DIAGNOSIS — T50905A Adverse effect of unspecified drugs, medicaments and biological substances, initial encounter: Secondary | ICD-10-CM

## 2012-06-08 DIAGNOSIS — F329 Major depressive disorder, single episode, unspecified: Secondary | ICD-10-CM | POA: Insufficient documentation

## 2012-06-08 DIAGNOSIS — J3489 Other specified disorders of nose and nasal sinuses: Secondary | ICD-10-CM | POA: Diagnosis not present

## 2012-06-08 DIAGNOSIS — E669 Obesity, unspecified: Secondary | ICD-10-CM | POA: Diagnosis not present

## 2012-06-08 DIAGNOSIS — Z8639 Personal history of other endocrine, nutritional and metabolic disease: Secondary | ICD-10-CM | POA: Insufficient documentation

## 2012-06-08 DIAGNOSIS — I10 Essential (primary) hypertension: Secondary | ICD-10-CM | POA: Diagnosis not present

## 2012-06-08 DIAGNOSIS — F3289 Other specified depressive episodes: Secondary | ICD-10-CM | POA: Insufficient documentation

## 2012-06-08 DIAGNOSIS — Z79899 Other long term (current) drug therapy: Secondary | ICD-10-CM | POA: Insufficient documentation

## 2012-06-08 DIAGNOSIS — Z8719 Personal history of other diseases of the digestive system: Secondary | ICD-10-CM | POA: Diagnosis not present

## 2012-06-08 DIAGNOSIS — F411 Generalized anxiety disorder: Secondary | ICD-10-CM | POA: Insufficient documentation

## 2012-06-08 DIAGNOSIS — T364X5A Adverse effect of tetracyclines, initial encounter: Secondary | ICD-10-CM | POA: Insufficient documentation

## 2012-06-08 DIAGNOSIS — T50995A Adverse effect of other drugs, medicaments and biological substances, initial encounter: Secondary | ICD-10-CM | POA: Diagnosis not present

## 2012-06-08 DIAGNOSIS — Z862 Personal history of diseases of the blood and blood-forming organs and certain disorders involving the immune mechanism: Secondary | ICD-10-CM | POA: Diagnosis not present

## 2012-06-08 NOTE — ED Notes (Signed)
Instructed by PA to stop taking Doxycycline.

## 2012-06-08 NOTE — ED Notes (Signed)
Rash noted to upper back/neck, arms, and face. Red and white bumps. States it is burning.

## 2012-06-08 NOTE — ED Notes (Signed)
Pt reports starting doxycycline for sinus infection on April 1st. Reports today noticing rash to back--burning sensation-- and discomfort in throat. pts airway intact.

## 2012-06-08 NOTE — ED Notes (Signed)
Pt states he has taken Loritin @ 1630hr

## 2012-06-08 NOTE — ED Provider Notes (Signed)
History     CSN: [id number]  Arrival date & time 06/08/12  1741   First MD Initiated Contact with Patient 06/08/12 1744      Chief Complaint  Patient presents with  . Allergic Reaction    (Consider location/radiation/quality/duration/timing/severity/associated sxs/prior treatment) HPI Comments: 32 y.o. Male presents complaining of rash he believes was caused by the doxycycline he began taking April 2 for a sinusitis. This is a new problem. Pt states he has been taking Flonase and Claritin for an allergy to pollen and has an appointment with an allergist for 06/11/12 for worsening seasonal allergies and was told not to take any anti-histamines 3 days prior to his appointment. States that rash has been worsening. Pt took no interventions.  Denies dyspnea, shortness of breath, nausea, vomiting, angioedema, eye redness, altered mental status.     Patient is a 32 y.o. male presenting with allergic reaction.  Allergic Reaction The primary symptoms are  rash. The primary symptoms do not include shortness of breath, nausea, vomiting, diarrhea, dizziness or palpitations.    Past Medical History  Diagnosis Date  . Anxiety   . OCD (obsessive compulsive disorder)   . Personality disorder     with schizoid features  . Tourette's     per [facility]  . Insomnia   . HTN (hypertension)     borderline  . Obesity   . Allergic rhinitis   . Glucose intolerance (impaired glucose tolerance)   . GERD (gastroesophageal reflux disease)   . Vitamin D deficiency   . Depression     Past Surgical History  Procedure Laterality Date  . Adnoidectomy  1992    Family History  Problem Relation Age of Onset  . Cholelithiasis Maternal Grandmother   . Diabetes Maternal Grandmother   . Prostate cancer Maternal Grandfather   . Colon polyps Maternal Grandfather   . Depression Mother   . Bipolar disorder Father     History  Substance Use Topics  . Smoking status: Never Smoker   . Smokeless tobacco:  Never Used  . Alcohol Use: No      Review of Systems  Constitutional: Negative for fever and diaphoresis.  HENT: Negative for neck pain and neck stiffness.   Eyes: Negative for visual disturbance.  Respiratory: Negative for apnea, chest tightness and shortness of breath.   Cardiovascular: Negative for chest pain and palpitations.  Gastrointestinal: Negative for nausea, vomiting, diarrhea and constipation.  Genitourinary: Negative for dysuria.  Musculoskeletal: Negative for gait problem.  Skin: Positive for rash.       Forehead, creases of nose, back of neck, shoulders  Neurological: Negative for dizziness, weakness, light-headedness, numbness and headaches.    Allergies  Honey bee treatment; Doxycycline; Quetiapine; Penicillins; and Sulfa antibiotics  Home Medications   Current Outpatient Rx  Name  Route  Sig  Dispense  Refill  . ARIPiprazole (ABILIFY) 5 MG tablet   Oral   Take 5 mg by mouth at bedtime.         . clotrimazole-betamethasone (LOTRISONE) cream   Topical   Apply topically 2 (two) times daily.   45 g   3   . fluticasone (FLONASE) 50 MCG/ACT nasal spray   Nasal   Place 2 sprays into the nose daily.   16 g   6   . loratadine (CLARITIN) 10 MG tablet   Oral   Take 10 mg by mouth daily.         . mirtazapine (REMERON) 30 MG tablet  Oral   Take 1 tablet (30 mg total) by mouth at bedtime.   30 tablet   0   . Multiple Vitamin (MULTIVITAMIN WITH MINERALS) TABS   Oral   Take 1 tablet by mouth every morning.         . venlafaxine XR (EFFEXOR-XR) 150 MG 24 hr capsule   Oral   Take 1 capsule (150 mg total) by mouth at bedtime.   30 capsule   0   . ondansetron (ZOFRAN ODT) 4 MG disintegrating tablet   Oral   Take 1 tablet (4 mg total) by mouth every 8 (eight) hours as needed for nausea.   20 tablet   0     BP 126/72  Pulse 80  Temp(Src) 98.7 F (37.1 C) (Oral)  Resp 18  SpO2 97%  Physical Exam  Nursing note and vitals  reviewed. Constitutional: He is oriented to person, place, and time. He appears well-developed and well-nourished. No distress.  HENT:  Head: Normocephalic and atraumatic.  Eyes: EOM are normal. Pupils are equal, round, and reactive to light.  Neck: Normal range of motion. Neck supple.  No meningeal signs  Cardiovascular: Normal rate, regular rhythm and normal heart sounds.  Exam reveals no gallop and no friction rub.   No murmur heard. Pulmonary/Chest: Effort normal and breath sounds normal. No respiratory distress. He has no wheezes. He has no rales. He exhibits no tenderness.  Abdominal: Soft. Bowel sounds are normal. He exhibits no distension. There is no tenderness. There is no rebound and no guarding.  Musculoskeletal: Normal range of motion. He exhibits no edema and no tenderness.  5/5 strength throughout. Mild swelling. No erythema. No warmth. No effusion. Good quadricep strength on straight leg raise. No joint laxity.  Neurological: He is alert and oriented to person, place, and time. No cranial nerve deficit.  Skin: Skin is warm and dry. Rash noted. He is not diaphoretic. There is erythema.  Non pruritic, slightly raised erythematous rash with no central clearing or wheals on forehead, transverse nasal creases, bilateral shoulders, back of neck, and less severe as progresses posteriorly down the trunk. Not noted on bilateral extremities or anterior trunk.    ED Course  Procedures (including critical care time)  Labs Reviewed - No data to display Dg Sinuses Complete  06/08/2012  *RADIOLOGY REPORT*  Clinical Data: Nasal congestion and sinusitis.  PARANASAL SINUSES - COMPLETE 3 + VIEW  Comparison: None.  Findings: Four view paranasal sinus series shows no evidence of sinus opacification, obvious mucosal thickening or air-fluid levels.  Surrounding facial bones are within normal limits.  IMPRESSION: Normal paranasal sinus films.   Original Report Authenticated By: Irish Lack, M.D.       1. Medication adverse effect, initial encounter       MDM  Pt does not present with sinusitis. Will get xray before providing a second antibiotic.   On re-evaluation, rash is already resolving, not as red/widespread as on initial exam.  Xray showed no evidence of sinus opacification, obvious mucosal thickening or air-fluid levels. Rash does not need treatment at this time and will likely dissipate with discontinuance of doxy.  Will discharge with return precautions and instructions to follow up with his primary care doctor and allergist as previously arranged.    At this time there does not appear to be any evidence of an acute emergency medical condition and the patient appears stable for discharge with appropriate outpatient follow up.Diagnosis was discussed with patient who verbalizes understanding and  is agreeable to discharge. Pt case discussed with and seen by Dr. Radford Pax who agrees with my plan.     Glade Nurse, PA-C 06/09/12 0100

## 2012-06-09 NOTE — ED Provider Notes (Signed)
Medical screening examination/treatment/procedure(s) were performed by non-physician practitioner and as supervising physician I was immediately available for consultation/collaboration.    [name], MD 06/09/12 1552 

## 2012-06-10 ENCOUNTER — Telehealth: Payer: Self-pay | Admitting: Internal Medicine

## 2012-06-10 DIAGNOSIS — F4001 Agoraphobia with panic disorder: Secondary | ICD-10-CM | POA: Diagnosis not present

## 2012-06-10 DIAGNOSIS — F200 Paranoid schizophrenia: Secondary | ICD-10-CM | POA: Diagnosis not present

## 2012-06-10 NOTE — Telephone Encounter (Signed)
I can see him today. Thx

## 2012-06-10 NOTE — Telephone Encounter (Signed)
Noted. Thx.

## 2012-06-10 NOTE — Telephone Encounter (Signed)
[facility] Triage Call Report Triage Record Num: [id number] Operator: Hillary Bow Patient Name: Bruce Ellison Call Date & Time: 06/08/2012 4:20:36PM Patient Phone: [phone] PCP: Sonda Primes Patient Gender: Male PCP Fax : [phone] Patient DOB: 1980-05-31 Practice Name: [facility] Reason for Call: ER CALL. Caller: Cartez/Patient; PCP: Plotnikov, Alex (Adults only); CB#: [phone]; Call regarding Rash/Hives;onset 4-5. Rash is hive like, burning like a sunburn on Back, Neck, Arms. Pt has appt w/ allergist on 4-8. Pt unable to take anti-histamine before appt. Hive protocol used, 911 dispo, due to Hives w/ abdominal discomfort w/ nausea. Advised Pt to call 911 , Pt will go to Urgent Care. Protocol(s) Used: Hives Recommended Outcome per Protocol: Activate EMS 911 Reason for Outcome: Signs/symptoms of anaphylaxis

## 2012-06-10 NOTE — Telephone Encounter (Signed)
I spoke to pt- he was seen at [facility] ED. They told him to d/c Doxycyline. They did a sinus xray and it was negative for infection. The rash is resolved since he has stopped doxycyline. He is seeing allergist tomorrow.

## 2012-06-11 DIAGNOSIS — J309 Allergic rhinitis, unspecified: Secondary | ICD-10-CM | POA: Diagnosis not present

## 2012-06-11 DIAGNOSIS — R05 Cough: Secondary | ICD-10-CM | POA: Diagnosis not present

## 2012-06-11 DIAGNOSIS — H1045 Other chronic allergic conjunctivitis: Secondary | ICD-10-CM | POA: Diagnosis not present

## 2012-06-12 ENCOUNTER — Emergency Department (HOSPITAL_COMMUNITY)
Admission: EM | Admit: 2012-06-12 | Discharge: 2012-06-13 | Disposition: A | Discharge status: Home / Self Care | Payer: Medicare Other | Attending: Emergency Medicine | Admitting: Emergency Medicine

## 2012-06-12 ENCOUNTER — Encounter (HOSPITAL_COMMUNITY): Payer: Self-pay | Admitting: Family Medicine

## 2012-06-12 DIAGNOSIS — L509 Urticaria, unspecified: Secondary | ICD-10-CM | POA: Diagnosis not present

## 2012-06-12 DIAGNOSIS — Z8639 Personal history of other endocrine, nutritional and metabolic disease: Secondary | ICD-10-CM | POA: Diagnosis not present

## 2012-06-12 DIAGNOSIS — R21 Rash and other nonspecific skin eruption: Secondary | ICD-10-CM | POA: Insufficient documentation

## 2012-06-12 DIAGNOSIS — G4730 Sleep apnea, unspecified: Secondary | ICD-10-CM | POA: Insufficient documentation

## 2012-06-12 DIAGNOSIS — E669 Obesity, unspecified: Secondary | ICD-10-CM | POA: Diagnosis not present

## 2012-06-12 DIAGNOSIS — I10 Essential (primary) hypertension: Secondary | ICD-10-CM | POA: Diagnosis not present

## 2012-06-12 DIAGNOSIS — T7840XA Allergy, unspecified, initial encounter: Secondary | ICD-10-CM

## 2012-06-12 DIAGNOSIS — F329 Major depressive disorder, single episode, unspecified: Secondary | ICD-10-CM | POA: Insufficient documentation

## 2012-06-12 DIAGNOSIS — Z862 Personal history of diseases of the blood and blood-forming organs and certain disorders involving the immune mechanism: Secondary | ICD-10-CM | POA: Insufficient documentation

## 2012-06-12 DIAGNOSIS — F605 Obsessive-compulsive personality disorder: Secondary | ICD-10-CM | POA: Insufficient documentation

## 2012-06-12 DIAGNOSIS — Z79899 Other long term (current) drug therapy: Secondary | ICD-10-CM | POA: Insufficient documentation

## 2012-06-12 DIAGNOSIS — Z8709 Personal history of other diseases of the respiratory system: Secondary | ICD-10-CM | POA: Diagnosis not present

## 2012-06-12 DIAGNOSIS — F3289 Other specified depressive episodes: Secondary | ICD-10-CM | POA: Insufficient documentation

## 2012-06-12 DIAGNOSIS — F411 Generalized anxiety disorder: Secondary | ICD-10-CM | POA: Diagnosis not present

## 2012-06-12 DIAGNOSIS — Z8719 Personal history of other diseases of the digestive system: Secondary | ICD-10-CM | POA: Diagnosis not present

## 2012-06-12 DIAGNOSIS — R0602 Shortness of breath: Secondary | ICD-10-CM | POA: Diagnosis not present

## 2012-06-12 HISTORY — DX: Sleep apnea, unspecified: G47.30

## 2012-06-12 MED ORDER — PREDNISONE 20 MG PO TABS: 40.0000 mg | ORAL_TABLET | Freq: Once | ORAL | Status: DC

## 2012-06-12 MED ORDER — FAMOTIDINE 20 MG PO TABS
20.0000 mg | ORAL_TABLET | Freq: Once | ORAL | Status: AC
  Administered 2012-06-12: 20 mg via ORAL
  Filled 2012-06-12: qty 1

## 2012-06-12 NOTE — ED Notes (Signed)
Patient states that he was tested for allergies yesterday and started having facial redness and difficulty breathing. Took Benadryl at 8pm because he felt that he couldn't swallow good and was having shortness of breath. No edema noted to throat. Patient also reports urinary frequency and dizziness.

## 2012-06-12 NOTE — ED Provider Notes (Signed)
History     CSN: [id number]  Arrival date & time 06/12/12  2131   First MD Initiated Contact with Patient 06/12/12 2313      Chief Complaint  Patient presents with  . Shortness of Breath    (Consider location/radiation/quality/duration/timing/severity/associated sxs/prior treatment) HPI Comments: Patient presents with complaint of hives, sensation of throat tightness after receiving an allergy test yesterday. After receiving allergy test patient developed facial redness and difficulty breathing. He returned to testing center and received an injection of epinephrine and cortisone which improved symptoms. He was told to return home and take Benadryl which he has been doing. This evening he began having some mild throat tightness without wheezing occurring only when he looks down. Patient denies facial swelling, tongue swelling, lip swelling. He denies vomiting or diarrhea. He denies lightheadedness or syncope. He continues to have hives on his back. Onset of symptoms gradual. Course is constant. Nothing makes symptoms better.  The history is provided by the patient and a parent.    Past Medical History  Diagnosis Date  . Anxiety   . OCD (obsessive compulsive disorder)   . Personality disorder     with schizoid features  . Tourette's     per [facility]  . Insomnia   . HTN (hypertension)     borderline  . Obesity   . Allergic rhinitis   . Glucose intolerance (impaired glucose tolerance)   . GERD (gastroesophageal reflux disease)   . Vitamin D deficiency   . Depression   . Sleep apnea     Past Surgical History  Procedure Laterality Date  . Adnoidectomy  1992    Family History  Problem Relation Age of Onset  . Cholelithiasis Maternal Grandmother   . Diabetes Maternal Grandmother   . Prostate cancer Maternal Grandfather   . Colon polyps Maternal Grandfather   . Depression Mother   . Bipolar disorder Father     History  Substance Use Topics  . Smoking status: Never  Smoker   . Smokeless tobacco: Never Used  . Alcohol Use: No      Review of Systems  Constitutional: Negative for fever.  HENT: Negative for facial swelling and trouble swallowing.   Eyes: Negative for redness.  Respiratory: Negative for cough, shortness of breath, wheezing and stridor.   Cardiovascular: Negative for chest pain.  Gastrointestinal: Negative for nausea and vomiting.  Musculoskeletal: Negative for myalgias.  Skin: Positive for rash.  Neurological: Negative for syncope and light-headedness.  Psychiatric/Behavioral: Negative for confusion.    Allergies  Honey bee treatment; Doxycycline; Quetiapine; Penicillins; and Sulfa antibiotics  Home Medications   Current Outpatient Rx  Name  Route  Sig  Dispense  Refill  . ARIPiprazole (ABILIFY) 5 MG tablet   Oral   Take 5 mg by mouth at bedtime.         . cetirizine (ZYRTEC) 10 MG tablet   Oral   Take 10 mg by mouth daily.         . diphenhydrAMINE (BENADRYL) 25 MG tablet   Oral   Take 25 mg by mouth every 6 (six) hours as needed (allergies).         . mirtazapine (REMERON) 30 MG tablet   Oral   Take 1 tablet (30 mg total) by mouth at bedtime.   30 tablet   0   . Multiple Vitamin (MULTIVITAMIN WITH MINERALS) TABS   Oral   Take 1 tablet by mouth every morning.         [name]  venlafaxine XR (EFFEXOR-XR) 75 MG 24 hr capsule   Oral   Take 75 mg by mouth daily.           BP 118/66  Pulse 97  Temp(Src) 98.4 F (36.9 C) (Oral)  Resp 20  SpO2 99%  Physical Exam  Nursing note and vitals reviewed. Constitutional: He appears well-developed and well-nourished.  HENT:  Head: Normocephalic and atraumatic.  Nose: Nose normal.  Mouth/Throat: Oropharynx is clear and moist.  No angioedema. No uvula edema.  Eyes: Conjunctivae are normal. Right eye exhibits no discharge. Left eye exhibits no discharge.  Neck: Normal range of motion. Neck supple. No tracheal deviation present.  Cardiovascular: Normal rate,  regular rhythm and normal heart sounds.   Pulmonary/Chest: Effort normal and breath sounds normal. No stridor. No respiratory distress. He has no wheezes. He has no rales.  Abdominal: Soft. There is no tenderness.  Neurological: He is alert.  Skin: Skin is warm and dry.  Psychiatric: He has a normal mood and affect.    ED Course  Procedures (including critical care time)  Labs Reviewed - No data to display No results found.   1. Allergic reaction, initial encounter     11:33 PM Patient seen and examined. Medications ordered. No airway compromise.   Vital signs reviewed and are as follows: Filed Vitals:   06/12/12 2208  BP: 118/66  Pulse: 97  Temp: 98.4 F (36.9 C)  Resp: 20   Patient offered prednisone but doesn't want dose here because he wants to go home and sleep. Dose given in emergency department. Patient is in no distress.  Prior to discharge, patient's family at nursing station requesting discharge.  Patient urged to return with worsening symptoms or other concerns. Patient verbalized understanding and agrees with plan.     MDM  Patient with recent allergic reaction, no signs of respiratory compromise or worsening reaction in emergency department. Patient has been in emergency department for over 2 hours. Feel course of steroids is indicated. Patient to continue Benadryl and Pepcid. He appears well. Handling secretions. No stridor or angioedema. Return instructions given.        Renne Crigler, PA-C 06/13/12 0127

## 2012-06-13 MED ORDER — PREDNISONE 20 MG PO TABS: 40.0000 mg | ORAL_TABLET | Freq: Every day | ORAL | Status: DC

## 2012-06-13 MED ORDER — FAMOTIDINE 20 MG PO TABS: 20.0000 mg | ORAL_TABLET | Freq: Two times a day (BID) | ORAL | Status: DC

## 2012-06-13 NOTE — ED Provider Notes (Signed)
Medical screening examination/treatment/procedure(s) were performed by non-physician practitioner and as supervising physician I was immediately available for consultation/collaboration.  Jones Skene, M.D.     Jones Skene, MD 06/13/12 [phone]

## 2012-06-13 NOTE — ED Notes (Signed)
Notified PA, Josh, pt requesting d/c.

## 2012-06-13 NOTE — ED Notes (Signed)
RN in another room when d/c papers available.  Pt getting upset regarding delay.  Merle RN assisted with d/c home.

## 2012-06-17 ENCOUNTER — Telehealth: Payer: Self-pay | Admitting: *Deleted

## 2012-06-17 NOTE — Telephone Encounter (Signed)
Pt is requesting refill for Benadryl 25mg  every 4 to 6 hours for pollen allergy.

## 2012-06-18 MED ORDER — DIPHENHYDRAMINE HCL 25 MG PO TABS: 25.0000 mg | ORAL_TABLET | Freq: Four times a day (QID) | ORAL | Status: DC | PRN

## 2012-06-18 NOTE — Telephone Encounter (Signed)
Pt informed rx sent to [facility] and MD's advisement.

## 2012-06-18 NOTE — Telephone Encounter (Signed)
Ok Be aware of side effects: sedation Thx

## 2012-06-27 ENCOUNTER — Ambulatory Visit (INDEPENDENT_AMBULATORY_CARE_PROVIDER_SITE_OTHER): Payer: Medicare Other | Admitting: Psychiatry

## 2012-06-27 ENCOUNTER — Encounter (HOSPITAL_COMMUNITY): Payer: Self-pay | Admitting: Psychiatry

## 2012-06-27 VITALS — BP 120/69 | HR 95 | Ht 67.0 in | Wt 203.2 lb

## 2012-06-27 DIAGNOSIS — F959 Tic disorder, unspecified: Secondary | ICD-10-CM

## 2012-06-27 DIAGNOSIS — F411 Generalized anxiety disorder: Secondary | ICD-10-CM | POA: Diagnosis not present

## 2012-06-27 DIAGNOSIS — F429 Obsessive-compulsive disorder, unspecified: Secondary | ICD-10-CM

## 2012-06-27 DIAGNOSIS — F952 Tourette's disorder: Secondary | ICD-10-CM | POA: Diagnosis not present

## 2012-06-27 NOTE — Progress Notes (Signed)
[facility] [address] Progress Note  Bruce Ellison [id number] 32 y.o.  06/27/2012 10:52 AM  Chief Complaint: I feel dizzy and having nightmares.  I like to change Remeron.  History of Present Illness:  Patient is 32 year old [place] American unemployed single male who came for his followup appointment.  Patient complained of dizziness and sometimes feeling very tired.  He also endorsed nightmares which he believed it to Remeron.  The endorse having a allergic reaction with doxycycline which was given by his primary care physician 3 weeks ago.  He was seen in the emergency room due to abusing hives rash and given epinephrine.  He felt better however few days ago he again seen in the emergency room because of symptoms relapse.  He was given this to voice and Benadryl but patient stopped taking these medications because of feeling very tired and having dizziness.  This is still feel dizzy sometimes.  His breathing is improved from the past.  He denies any recent agitation anger or any crying spells.  His OCD and anxiety symptoms are under control however he is concerned about his nightmares.  He does not want to see therapist because he endorse being very busy at home.  He is taking care office elderly parents.  His grandmother is also in hospice care.  Today patient also mentioned that he is seen at [facility] on April 7 and given psychiatric medication.  He was given BuSpar 10 mg twice a day however he has not started yet.  He believed his anxiety is under control and does not need BuSpar.  He was continued Remeron and Effexor from [place].  He does not use his CPAP machine.  Patient remains anxious and nervous.  He is preoccupied with his psychiatric medication and symptoms.  However he denies any recent obsessive thoughts about the numbers .  He is less irritable .  He denied any trying spells or any education.  He admitted does not do regular exercise .  However his weight has been stable.  He is  not drinking or using any illicit substance.  He denies any aggression or any violence.  Suicidal Ideation: No Plan Formed: No Patient has means to carry out plan: No  Homicidal Ideation: No Plan Formed: No Patient has means to carry out plan: No  Review of Systems: Psychiatric: Agitation: No Hallucination: No Depressed Mood: Yes Insomnia: Yes Hypersomnia: No Altered Concentration: No Feels Worthless: No Grandiose Ideas: No Belief In Special Powers: No New/Increased Substance Abuse: No Compulsions: Counting numbers   Review of Systems  Constitutional: Positive for malaise/fatigue.  HENT: Positive for congestion.   Eyes: Negative.   Respiratory: Positive for cough.   Genitourinary: Negative.   Musculoskeletal: Negative for joint pain and falls.  Neurological: Positive for dizziness and weakness. Negative for tingling, tremors, sensory change, speech change, focal weakness and seizures.       Gait normal  Psychiatric/Behavioral: The patient is nervous/anxious.        Nightmares   Past Medical History:  Patient has psychiatric treatment in the past.  He was diagnosed with OCD and tic disorder .   His primary care physician is Dr. Carlisle Cater.  He has obstructive sleep apnea, dermatitis, obesity and he was also diagnosed with glucose intolerance.  However he is not taking any medication.    Social history.   Patient was born in [place], [place]. [place].  At age 16 his parents got divorced.  He moved the [place] at age 18.  He is living with his mother and grandparents.  Patient father still lives in [place].  Patient has no contact with his father.  Patient has never married.  He has no children.  He has limited social network.  Outpatient Encounter Prescriptions as of 06/27/2012  Medication Sig Dispense Refill  . ARIPiprazole (ABILIFY) 5 MG tablet Take 5 mg by mouth at bedtime.      . mirtazapine (REMERON) 15 MG tablet Take 15 mg by mouth at bedtime.      . Multiple Vitamin  (MULTIVITAMIN WITH MINERALS) TABS Take 1 tablet by mouth every morning.      . venlafaxine XR (EFFEXOR-XR) 75 MG 24 hr capsule Take 75 mg by mouth daily.      . [DISCONTINUED] mirtazapine (REMERON) 30 MG tablet Take 1 tablet (30 mg total) by mouth at bedtime.  30 tablet  0  . cetirizine (ZYRTEC) 10 MG tablet Take 10 mg by mouth daily.      . diphenhydrAMINE (BENADRYL) 25 MG tablet Take 1 tablet (25 mg total) by mouth every 6 (six) hours as needed (allergies).  100 tablet  1  . famotidine (PEPCID) 20 MG tablet Take 1 tablet (20 mg total) by mouth 2 (two) times daily.  30 tablet  0  . predniSONE (DELTASONE) 20 MG tablet Take 2 tablets (40 mg total) by mouth daily.  8 tablet  0   No facility-administered encounter medications on file as of 06/27/2012.    Past Psychiatric History/Hospitalization(s): Anxiety: Patient has history of psychiatric illness at age 61.  At that time he was in [place].  He has Tic, anxiety and anger. Bipolar Disorder: No Depression: Yes, he had tried Zoloft, Prozac and recently Luvox. Mania: No Psychosis: Patient has history of paranoia.  In the past he had tried Geodon, Risperdal and Seroquel.  He was seeing psychiatrist at [facility].  In the past he was also to get treatment in [place].  He was seeing psychiatrist in [facility] in [place].  He&amp;#39;s been taking the medication since age 23.  Recently his been seeing Dr. Evelene Croon who had prescribed her Zoloft, Valium, Klonopin, Xanax, Prozac and Ambien.  We also tried luvox however he developed more irritability. Schizophrenia: No Personality Disorder: No Hospitalization for psychiatric illness: No History of Electroconvulsive Shock Therapy: No Prior Suicide Attempts: No  Physical Exam: Constitutional:  BP 120/69  Pulse 95  Ht 5\' 7"  (1.702 m)  Wt 203 lb 3.2 oz (92.171 kg)  BMI 31.82 kg/m2  General Appearance: alert, oriented, no acute distress and Poorly groomed.  He is shy and maintained poor eye  contact.  Musculoskeletal: Strength & Muscle Tone: within normal limits Gait & Station: normal Patient leans: N/A  Psychiatric: Speech (describe rate, volume, coherence, spontaneity, and abnormalities if any): Slow but clear and coherent.  Thought Process (describe rate, content, abstract reasoning, and computation): Slow but logical and goal-directed.  Associations: Relevant, Intact and Obsessive thoughts.  Thoughts: obsessions and Obsessive thoughts but no paranoia or delusion or grandiosity.  Mental Status: Orientation: oriented to person and place Mood & Affect: anxiety and Constricted affect. Attention Span & Concentration: Poor  Medical Decision Making (Choose Three): New problem, with additional work up planned, Review of Psycho-Social Stressors (1), Review and summation of old records (2), Established Problem, Worsening (2), Review of Medication Regimen & Side Effects (2) and Review of New Medication or Change in Dosage (2)  Assessment: Axis I: Obsessive-compulsive disorder, Tourette's syndrome, Tic disorder, anxiety disorder NOS  Axis II: Deferred  Axis III: See medical history Patient Active Problem List  Diagnosis  . VITAMIN D DEFICIENCY  . GLUCOSE INTOLERANCE  . ALLERGIC RHINITIS  . GERD  . CELLULITIS/ABSCESS, FOOT  . SEBORRHEIC DERMATITIS  . BACK PAIN  . FREQUENCY, URINARY  . Cerumen impaction  . Polycythemia  . OSA (obstructive sleep apnea)  . Varicose veins of lower extremities with other complications  . Major depressive disorder, recurrent episode, severe, without mention of psychotic behavior  . Generalized anxiety disorder  . Sinusitis nasal  . Hyperglycemia  . Weight gain    Axis IV: Moderate  Axis V: 50- 55   Plan:   OCD. Patient's OCD is under control.  He'll continue Effexor and Abilify at present dose.  However I would reduce his Remeron to 15 mg to help the nightmare .  I recommend if his anxiety started to get worse or if he started  to have insomnia and then he need to restart 30 mg Remeron.  I recommend not to start BuSpar until he discuss with [place].  I also encouraged him to keep appointment with one practice, since he is also seeing psychiatrist at [facility] .  Patient will followup at: [facility] with this Clinical research associate.  He does not want therapy due to his busy schedule.    Fatigue and weakness.   I encouraged him to start CPAP machine which he has not used it a lot.  I also recommend to do regular exercise and watch his calorie intake.    Blood work results. I reviewed his blood work which was done on April 4 when he visited the emergency room.  His glucose was 113, TSH 1.3 and hemoglobin A1c 5.3.  Recommend to call us back if he is any question or concern if he feel worsening of the symptom.  I will see him again in 2 months.  Time spent 25 minutes.  More than 50% of the time spent in psycho education, counseling and coordination of care .  Nikeya Maxim T., MD 06/27/2012

## 2012-07-04 DIAGNOSIS — H5210 Myopia, unspecified eye: Secondary | ICD-10-CM | POA: Diagnosis not present

## 2012-07-04 DIAGNOSIS — H40059 Ocular hypertension, unspecified eye: Secondary | ICD-10-CM | POA: Diagnosis not present

## 2012-08-06 ENCOUNTER — Other Ambulatory Visit: Payer: Self-pay | Admitting: Internal Medicine

## 2012-08-08 ENCOUNTER — Ambulatory Visit (HOSPITAL_COMMUNITY): Payer: Self-pay | Admitting: Psychiatry

## 2012-08-09 ENCOUNTER — Encounter (HOSPITAL_COMMUNITY): Payer: Self-pay | Admitting: Psychiatry

## 2012-08-09 ENCOUNTER — Ambulatory Visit (HOSPITAL_COMMUNITY): Payer: Medicare Other | Admitting: Psychiatry

## 2012-08-09 ENCOUNTER — Telehealth (HOSPITAL_COMMUNITY): Payer: Self-pay

## 2012-08-09 VITALS — BP 130/70 | HR 72 | Resp 12 | Ht 67.0 in | Wt 204.0 lb

## 2012-08-09 DIAGNOSIS — F429 Obsessive-compulsive disorder, unspecified: Secondary | ICD-10-CM

## 2012-08-09 DIAGNOSIS — F411 Generalized anxiety disorder: Secondary | ICD-10-CM

## 2012-08-09 DIAGNOSIS — F959 Tic disorder, unspecified: Secondary | ICD-10-CM

## 2012-08-09 DIAGNOSIS — F952 Tourette's disorder: Secondary | ICD-10-CM

## 2012-08-09 NOTE — Telephone Encounter (Signed)
08/09/2012 - Spoke with Chinita Pester regarding payment of $150.00/dlo

## 2012-08-09 NOTE — Progress Notes (Signed)
[facility] [id number] Progress Note  Bruce Ellison [id number] 32 y.o.  08/09/2012 11:55 AM  Chief Complaint: I'm doing better on the medication.  I have fewer nightmares.  History of Present Illness:  Bruce Ellison is 32 year old [place] American unemployed single male who came for his followup appointment.  On his last visit we reduced his Remeron from 30 mg to 50 mg because he was complaining of nightmares.  He is doing better on his current medication.  He denies any recent depressive thoughts.  He still has nightmares but they're less intense and less frequent.  He has not started CPAP machine but promised that he will start soon .  He's been busy taking care of his parent's and grandparents.  He has Ativan prescription from or not but he rarely uses it.  He is complying with his Abilify, Effexor and Remeron.  He denies any irritability anger or any mood swing.  His OCD symptoms are much better.  Is not drinking or using any illegal substance.  He remains sometime anxious and nervous and preoccupied with his psychiatric medication and symptoms however overall his condition is much improved.  Suicidal Ideation: No Plan Formed: No Bruce Ellison has means to carry out plan: No  Homicidal Ideation: No Plan Formed: No Bruce Ellison has means to carry out plan: No  Review of Systems: Psychiatric: Agitation: No Hallucination: No Depressed Mood: Yes Insomnia: Yes Hypersomnia: No Altered Concentration: No Feels Worthless: No Grandiose Ideas: No Belief In Special Powers: No New/Increased Substance Abuse: No Compulsions: Counting numbers   Review of Systems  Constitutional: Negative.   HENT: Negative.   Eyes: Negative.   Respiratory: Negative.   Cardiovascular: Negative.   Genitourinary: Negative.   Musculoskeletal: Negative.  Negative for joint pain and falls.  Skin: Negative.   Neurological: Negative for tingling, tremors, sensory change, speech change, focal weakness and seizures.   Gait normal  Psychiatric/Behavioral: The Bruce Ellison is nervous/anxious.        Nightmares   Past Medical History:  Bruce Ellison has psychiatric treatment in the past.  He was diagnosed with OCD and tic disorder .   His primary care physician is Dr. Carlisle Cater.  He has obstructive sleep apnea, dermatitis, obesity and he was also diagnosed with glucose intolerance.  However he is not taking any medication.    Social history.   Bruce Ellison was born in [place], [place]. [place].  At age 26 his parents got divorced.  He moved the [place] at age 69.  He is living with his mother and grandparents.  Bruce Ellison father still lives in [place].  Bruce Ellison has no contact with his father.  Bruce Ellison has never married.  He has no children.  He has limited social network.  Outpatient Encounter Prescriptions as of 08/09/2012  Medication Sig Dispense Refill  . ARIPiprazole (ABILIFY) 5 MG tablet Take 5 mg by mouth at bedtime.      . mirtazapine (REMERON) 15 MG tablet Take 15 mg by mouth at bedtime.      . Multiple Vitamin (MULTIVITAMIN WITH MINERALS) TABS Take 1 tablet by mouth every morning.      . venlafaxine XR (EFFEXOR-XR) 75 MG 24 hr capsule Take 75 mg by mouth daily.      [name] LORazepam (ATIVAN) 0.5 MG tablet       . [DISCONTINUED] cetirizine (ZYRTEC) 10 MG tablet Take 10 mg by mouth daily.      . [DISCONTINUED] [employer] ALLERGY 25 MG capsule TAKE 1 CAPSULE BY MOUTH EVERY 6 HOURS AS NEEDED (  ALLERGIES).  100 capsule  1  . [DISCONTINUED] famotidine (PEPCID) 20 MG tablet Take 1 tablet (20 mg total) by mouth 2 (two) times daily.  30 tablet  0  . [DISCONTINUED] predniSONE (DELTASONE) 20 MG tablet Take 2 tablets (40 mg total) by mouth daily.  8 tablet  0   No facility-administered encounter medications on file as of 08/09/2012.    Past Psychiatric History/Hospitalization(s): Anxiety: Bruce Ellison has history of psychiatric illness at age 20.  At that time he was in [place].  He has Tic, anxiety and anger. Bipolar Disorder: No Depression: Yes, he had  tried Zoloft, Prozac and recently Luvox. Mania: No Psychosis: Bruce Ellison has history of paranoia.  In the past he had tried Geodon, Risperdal and Seroquel.  He was seeing psychiatrist at [facility].  In the past he was also to get treatment in [place].  He was seeing psychiatrist in [place] in [place].  He&amp;#39;s been taking the medication since age 30.  Recently his been seeing Dr. Evelene Croon who had prescribed her Zoloft, Valium, Klonopin, Xanax, Prozac and Ambien.  We also tried luvox however he developed more irritability. Schizophrenia: No Personality Disorder: No Hospitalization for psychiatric illness: No History of Electroconvulsive Shock Therapy: No Prior Suicide Attempts: No  Physical Exam: Constitutional:  BP 130/70  Pulse 72  Resp 12  Ht 5\' 7"  (1.702 m)  Wt 204 lb (92.534 kg)  BMI 31.94 kg/m2  General Appearance: alert, oriented, no acute distress and Poorly groomed.  He is shy and maintained poor eye contact.  Musculoskeletal: Strength & Muscle Tone: within normal limits Gait & Station: normal Bruce Ellison leans: N/A  Psychiatric: Speech (describe rate, volume, coherence, spontaneity, and abnormalities if any): Slow but clear and coherent.  Thought Process (describe rate, content, abstract reasoning, and computation): Slow but logical and goal-directed.  Associations: Relevant, Intact and Obsessive thoughts.  Thoughts: obsessions and Obsessive thoughts but no paranoia or delusion or grandiosity.  Mental Status: Orientation: oriented to person and place Mood & Affect: anxiety and Constricted affect. Attention Span & Concentration: Poor  Medical Decision Making (Choose Three): Established Problem, Stable/Improving (1), Review of Psycho-Social Stressors (1), Review of Last Therapy Session (1) and Review of Medication Regimen & Side Effects (2)  Assessment: Axis I: Obsessive-compulsive disorder, Tourette's syndrome, Tic disorder, anxiety disorder NOS  Axis  II: Deferred  Axis III: See medical history Bruce Ellison Active Problem List   Diagnosis Date Noted  . Sinusitis nasal 06/04/2012  . Hyperglycemia 06/04/2012  . Weight gain 06/04/2012  . Major depressive disorder, recurrent episode, severe, without mention of psychotic behavior 05/28/2012  . Generalized anxiety disorder 05/28/2012  . Varicose veins of lower extremities with other complications 04/01/2012  . OSA (obstructive sleep apnea) 02/14/2011  . Polycythemia 12/14/2010  . Cerumen impaction 06/24/2010  . VITAMIN D DEFICIENCY 12/25/2008  . SEBORRHEIC DERMATITIS 07/23/2008  . FREQUENCY, URINARY 07/23/2008  . CELLULITIS/ABSCESS, FOOT 09/11/2007  . GLUCOSE INTOLERANCE 04/11/2007  . ALLERGIC RHINITIS 04/11/2007  . GERD 04/11/2007  . BACK PAIN 04/11/2007    Axis IV: Moderate  Axis V: 50- 55   Plan:   I will continue his current psychiatric medication because Effexor Abilify and Remeron.  He has Ativan prescription however he rarely uses it.  Recommend to use CPAP machine.  Explain risk and benefits of medication and recommend to call us back it is a question or concern if it could worsening of the symptom.  I will see him in 2 months.  Bruce Ellison still  has refill remaining however he would call us when he would be close to ran out.     Lakeyn Dokken T., MD 08/09/2012

## 2012-08-26 ENCOUNTER — Encounter (HOSPITAL_COMMUNITY): Payer: Self-pay | Admitting: Psychiatry

## 2012-08-26 ENCOUNTER — Ambulatory Visit (INDEPENDENT_AMBULATORY_CARE_PROVIDER_SITE_OTHER): Payer: Medicare Other | Admitting: Psychiatry

## 2012-08-26 VITALS — BP 123/69 | HR 81 | Ht 67.0 in | Wt 207.4 lb

## 2012-08-26 DIAGNOSIS — F952 Tourette's disorder: Secondary | ICD-10-CM | POA: Diagnosis not present

## 2012-08-26 DIAGNOSIS — F429 Obsessive-compulsive disorder, unspecified: Secondary | ICD-10-CM | POA: Diagnosis not present

## 2012-08-26 DIAGNOSIS — F959 Tic disorder, unspecified: Secondary | ICD-10-CM | POA: Diagnosis not present

## 2012-08-26 DIAGNOSIS — F411 Generalized anxiety disorder: Secondary | ICD-10-CM | POA: Diagnosis not present

## 2012-08-26 MED ORDER — TRAZODONE HCL 100 MG PO TABS: ORAL_TABLET | ORAL | Status: DC

## 2012-08-26 NOTE — Progress Notes (Signed)
[name] [address] Progress Note  Bruce Ellison [id number] 32 y.o.  08/26/2012 2:26 PM  Chief Complaint: I cannot sleep.    History of Present Illness:  Bruce Ellison is 32 year old [place] American unemployed single male who came earlier than his scheduled appointment.  He is complaining of poor sleep.  We have reduced his Remeron to 15 milligrams on his last visit.  Bruce Ellison does not want to increase his Remeron due to nightmares .  He wants to try a different medication.  He has never tried trazodone.  Overall his OCD is much improved.  He is not taking Ativan .  There has been no change since last visit.  He is complying with his Abilify and Effexor.  He has any side effects including any tremors or shakes.  Is not drinking or using any illegal substance.  Suicidal Ideation: No Plan Formed: No Bruce Ellison has means to carry out plan: No  Homicidal Ideation: No Plan Formed: No Bruce Ellison has means to carry out plan: No  Review of Systems: Psychiatric: Agitation: No Hallucination: No Depressed Mood: No Insomnia: Yes Hypersomnia: No Altered Concentration: No Feels Worthless: No Grandiose Ideas: No Belief In Special Powers: No New/Increased Substance Abuse: No Compulsions: Counting numbers   Review of Systems  Constitutional: Negative.   HENT: Negative.   Eyes: Negative.   Respiratory: Negative.   Cardiovascular: Negative.   Genitourinary: Negative.   Musculoskeletal: Negative.  Negative for joint pain and falls.  Skin: Negative.   Neurological: Negative for tingling, tremors, sensory change, speech change, focal weakness and seizures.       Gait normal  Psychiatric/Behavioral: The Bruce Ellison is nervous/anxious.        Nightmares   Past Medical History:  Bruce Ellison has psychiatric treatment in the past.  He was diagnosed with OCD and tic disorder .   His primary care physician is Dr. Carlisle Cater.  He has obstructive sleep apnea, dermatitis, obesity and he was also diagnosed  with glucose intolerance.  However he is not taking any medication.    Social history.   Bruce Ellison was born in [place], [place]. [place].  At age 2 his parents got divorced.  He moved the [place] at age 20.  He is living with his mother and grandparents.  Bruce Ellison father still lives in [place].  Bruce Ellison has no contact with his father.  Bruce Ellison has never married.  He has no children.  He has limited social network.  Outpatient Encounter Prescriptions as of 08/26/2012  Medication Sig Dispense Refill  . ARIPiprazole (ABILIFY) 5 MG tablet Take 5 mg by mouth at bedtime.      . mirtazapine (REMERON) 15 MG tablet Take 15 mg by mouth at bedtime.      . Multiple Vitamin (MULTIVITAMIN WITH MINERALS) TABS Take 1 tablet by mouth every morning.      . venlafaxine XR (EFFEXOR-XR) 75 MG 24 hr capsule Take 75 mg by mouth daily.      . traZODone (DESYREL) 100 MG tablet Take 1/2 to 1 tab at bed time  30 tablet  0  . [DISCONTINUED] LORazepam (ATIVAN) 0.5 MG tablet        No facility-administered encounter medications on file as of 08/26/2012.    Past Psychiatric History/Hospitalization(s): Anxiety: Bruce Ellison has history of psychiatric illness at age 44.  At that time he was in [place].  He has Tic, anxiety and anger. Bipolar Disorder: No Depression: Yes, he had tried Zoloft, Prozac and recently Luvox. Mania: No Psychosis: Bruce Ellison has history of  paranoia.  In the past he had tried Geodon, Risperdal and Seroquel.  He was seeing psychiatrist at [facility].  In the past he was also to get treatment in [place].  He was seeing psychiatrist in [place] in [place].  He&amp;#39;s been taking the medication since age 55.  Recently his been seeing Dr. Evelene Croon who had prescribed her Zoloft, Valium, Klonopin, Xanax, Prozac and Ambien.  We also tried luvox however he developed more irritability. Schizophrenia: No Personality Disorder: No Hospitalization for psychiatric illness: No History of Electroconvulsive Shock Therapy:  No Prior Suicide Attempts: No  Physical Exam: Constitutional:  BP 123/69  Pulse 81  Ht 5\' 7"  (1.702 m)  Wt 207 lb 6.4 oz (94.076 kg)  BMI 32.48 kg/m2  General Appearance: alert, oriented, no acute distress and Poorly groomed.  He is shy and maintained poor eye contact.  Musculoskeletal: Strength & Muscle Tone: within normal limits Gait & Station: normal Bruce Ellison leans: N/A  Psychiatric: Speech (describe rate, volume, coherence, spontaneity, and abnormalities if any): Slow but clear and coherent.  Thought Process (describe rate, content, abstract reasoning, and computation): Slow but logical and goal-directed.  Associations: Relevant, Intact and Obsessive thoughts.  Thoughts: obsessions and Obsessive thoughts but no paranoia or delusion or grandiosity.  Mental Status: Orientation: oriented to person and place Mood & Affect: anxiety and Constricted affect. Attention Span & Concentration: Poor  Medical Decision Making (Choose Three): Established Problem, Stable/Improving (1), Review of Psycho-Social Stressors (1), Review of Last Therapy Session (1) and Review of Medication Regimen & Side Effects (2)  Assessment: Axis I: Obsessive-compulsive disorder, Tourette's syndrome, Tic disorder, anxiety disorder NOS  Axis II: Deferred  Axis III: See medical history Bruce Ellison Active Problem List   Diagnosis Date Noted  . Sinusitis nasal 06/04/2012  . Hyperglycemia 06/04/2012  . Weight gain 06/04/2012  . Major depressive disorder, recurrent episode, severe, without mention of psychotic behavior 05/28/2012  . Generalized anxiety disorder 05/28/2012  . Varicose veins of lower extremities with other complications 04/01/2012  . OSA (obstructive sleep apnea) 02/14/2011  . Polycythemia 12/14/2010  . Cerumen impaction 06/24/2010  . VITAMIN D DEFICIENCY 12/25/2008  . SEBORRHEIC DERMATITIS 07/23/2008  . FREQUENCY, URINARY 07/23/2008  . CELLULITIS/ABSCESS, FOOT 09/11/2007  . GLUCOSE  INTOLERANCE 04/11/2007  . ALLERGIC RHINITIS 04/11/2007  . GERD 04/11/2007  . BACK PAIN 04/11/2007    Axis IV: Moderate  Axis V: 50- 55   Plan:   I recommend to try trazodone 100 mg , half to 1 tablet at bedtime.  I recommend to try without Remeron unless trazodone alone cannot help insomnia.  I will discontinue Ativan since he is not taking it.  Recommend to use CPAP machine which he is not using it.  Recommend to cause back if he is a question or concern otherwise I will see him again in 6 weeks.  Bruce Ellison has enough refills on his Abilify and Effexor.  I will provide a new discussion of trazodone 100 mg half to 1 tablet at bedtime.  Rathana Viveros T., MD 08/26/2012

## 2012-09-03 DIAGNOSIS — F607 Dependent personality disorder: Secondary | ICD-10-CM | POA: Diagnosis not present

## 2012-09-03 DIAGNOSIS — F200 Paranoid schizophrenia: Secondary | ICD-10-CM | POA: Diagnosis not present

## 2012-10-09 ENCOUNTER — Ambulatory Visit (HOSPITAL_COMMUNITY): Payer: Self-pay | Admitting: Psychiatry

## 2012-10-14 ENCOUNTER — Telehealth (HOSPITAL_COMMUNITY): Payer: Self-pay

## 2012-10-15 ENCOUNTER — Telehealth (HOSPITAL_COMMUNITY): Payer: Self-pay | Admitting: Psychiatry

## 2012-10-15 DIAGNOSIS — F429 Obsessive-compulsive disorder, unspecified: Secondary | ICD-10-CM

## 2012-10-15 MED ORDER — LORAZEPAM 0.5 MG PO TABS: 0.5000 mg | ORAL_TABLET | ORAL | Status: DC | PRN

## 2012-10-15 NOTE — Telephone Encounter (Signed)
Patient acquires Ativan 0.5 mg as needed.  He was getting it from [facility] however patient is no longer seeing physician at [facility].  We will provide Ativan 0.5 mg as needed #15 no extra refill.  I called the pharmacy.

## 2012-10-24 ENCOUNTER — Encounter (HOSPITAL_COMMUNITY): Payer: Self-pay | Admitting: Psychiatry

## 2012-10-24 ENCOUNTER — Ambulatory Visit (INDEPENDENT_AMBULATORY_CARE_PROVIDER_SITE_OTHER): Payer: Medicare Other | Admitting: Psychiatry

## 2012-10-24 VITALS — BP 126/78 | HR 83 | Ht 67.0 in | Wt 208.0 lb

## 2012-10-24 DIAGNOSIS — F411 Generalized anxiety disorder: Secondary | ICD-10-CM

## 2012-10-24 DIAGNOSIS — F959 Tic disorder, unspecified: Secondary | ICD-10-CM

## 2012-10-24 DIAGNOSIS — F429 Obsessive-compulsive disorder, unspecified: Secondary | ICD-10-CM | POA: Diagnosis not present

## 2012-10-24 DIAGNOSIS — F952 Tourette's disorder: Secondary | ICD-10-CM

## 2012-10-24 MED ORDER — VENLAFAXINE HCL ER 75 MG PO CP24: 75.0000 mg | ORAL_CAPSULE | Freq: Every day | ORAL | Status: DC

## 2012-10-24 MED ORDER — ARIPIPRAZOLE 5 MG PO TABS: 5.0000 mg | ORAL_TABLET | Freq: Every day | ORAL | Status: DC

## 2012-10-24 MED ORDER — MIRTAZAPINE 15 MG PO TABS: 15.0000 mg | ORAL_TABLET | Freq: Every day | ORAL | Status: DC

## 2012-10-24 NOTE — Progress Notes (Signed)
[facility] [address] Progress Note  Bruce Ellison [id number] 32 y.o.  10/24/2012 3:48 PM  Chief Complaint: I'm sleeping better.      History of Present Illness:  Patient is 32 year old [place] unemployed single male who came for his appointment.  Patient is compliant with his psychiatric medication.  Recently he asked for Ativan 0.5 mg which he usually takes it he is very anxious and nervous.  He is no longer taking trazodone.  He likes Remeron and Effexor and Abilify which he takes all together at bedtime.  His OCD symptoms are much improved and under control.  He denies any irritability or any anger.  He admitted not using CPAP machine because he usually forgets the denies any nightmares or any flashbacks.  His appetite and weight is unchanged from the past.  He denies any tremors or shakes.  He is not using any drugs are drinking alcohol.  Suicidal Ideation: No Plan Formed: No Patient has means to carry out plan: No  Homicidal Ideation: No Plan Formed: No Patient has means to carry out plan: No  Review of Systems: Psychiatric: Agitation: No Hallucination: No Depressed Mood: No Insomnia: No Hypersomnia: No Altered Concentration: No Feels Worthless: No Grandiose Ideas: No Belief In Special Powers: No New/Increased Substance Abuse: No Compulsions: Counting numbers   Review of Systems  Constitutional: Negative.   HENT: Negative.   Eyes: Negative.   Respiratory: Negative.   Cardiovascular: Negative.   Genitourinary: Negative.   Musculoskeletal: Negative.  Negative for joint pain and falls.  Skin: Negative.   Neurological: Negative for tingling, tremors, sensory change, speech change, focal weakness and seizures.       Gait normal  Psychiatric/Behavioral: The patient is nervous/anxious.      Past Medical History  Diagnosis Date  . Anxiety   . OCD (obsessive compulsive disorder)   . Personality disorder     with schizoid features  . Tourette's      per [facility]  . Insomnia   . HTN (hypertension)     borderline  . Obesity   . Allergic rhinitis   . Glucose intolerance (impaired glucose tolerance)   . GERD (gastroesophageal reflux disease)   . Vitamin D deficiency   . Depression   . Sleep apnea    His primary care physician is Dr. Carlisle Cater. He is not taking medication.    Social history.   Patient was born in [place], [place]. [place].  At age 47 his parents got divorced.  He moved the [place] at age 13.  He is living with his mother and grandparents.  Patient father still lives in [place].  Patient has no contact with his father.  Patient has never married.  He has no children.  He has limited social network.  Outpatient Encounter Prescriptions as of 10/24/2012  Medication Sig Dispense Refill  . ARIPiprazole (ABILIFY) 5 MG tablet Take 1 tablet (5 mg total) by mouth at bedtime.  30 tablet  1  . LORazepam (ATIVAN) 0.5 MG tablet Take 1 tablet (0.5 mg total) by mouth as needed for anxiety.  15 tablet  0  . mirtazapine (REMERON) 15 MG tablet Take 1 tablet (15 mg total) by mouth at bedtime.  30 tablet  1  . Multiple Vitamin (MULTIVITAMIN WITH MINERALS) TABS Take 1 tablet by mouth every morning.      . venlafaxine XR (EFFEXOR-XR) 75 MG 24 hr capsule Take 1 capsule (75 mg total) by mouth daily.  30 capsule  1  . [  DISCONTINUED] ARIPiprazole (ABILIFY) 5 MG tablet Take 5 mg by mouth at bedtime.      . [DISCONTINUED] mirtazapine (REMERON) 15 MG tablet Take 15 mg by mouth at bedtime.      . [DISCONTINUED] venlafaxine XR (EFFEXOR-XR) 75 MG 24 hr capsule Take 75 mg by mouth daily.      . [DISCONTINUED] traZODone (DESYREL) 100 MG tablet Take 1/2 to 1 tab at bed time  30 tablet  0   No facility-administered encounter medications on file as of 10/24/2012.    Past Psychiatric History/Hospitalization(s): Anxiety: Patient has history of psychiatric illness at age 54.  At that time he was in [place].  He has Tic, anxiety and anger. Bipolar Disorder:  No Depression: Yes, he had tried Zoloft, Prozac and recently Luvox. Mania: No Psychosis: Patient has history of paranoia.  In the past he had tried Geodon, Risperdal and Seroquel.  He was seeing psychiatrist at [facility].  In the past he was also to get treatment in [place].  He was seeing psychiatrist in [place] in [place].  He&amp;#39;s been taking the medication since age 67.  Recently his been seeing Dr. Evelene Croon who had prescribed her Zoloft, Valium, Klonopin, Xanax, Prozac and Ambien.  We also tried luvox however he developed more irritability. Schizophrenia: No Personality Disorder: No Hospitalization for psychiatric illness: No History of Electroconvulsive Shock Therapy: No Prior Suicide Attempts: No  Physical Exam: Constitutional:  BP 126/78  Pulse 83  Ht 5\' 7"  (1.702 m)  Wt 208 lb (94.348 kg)  BMI 32.57 kg/m2  General Appearance: alert, oriented, no acute distress and Poorly groomed.  He is shy and maintained poor eye contact.  Musculoskeletal: Strength & Muscle Tone: within normal limits Gait & Station: normal Patient leans: N/A  Psychiatric: Speech (describe rate, volume, coherence, spontaneity, and abnormalities if any): Slow but clear and coherent.  Thought Process (describe rate, content, abstract reasoning, and computation): Slow but logical and goal-directed.  Associations: Relevant, Intact and Obsessive thoughts.  Thoughts: obsessions and Obsessive thoughts but no paranoia or delusion or grandiosity.  Mental Status: Orientation: oriented to person and place Mood & Affect: anxiety and Constricted affect. Attention Span & Concentration: Poor  Medical Decision Making (Choose Three): Established Problem, Stable/Improving (1), Review of Psycho-Social Stressors (1), Review of Last Therapy Session (1) and Review of Medication Regimen & Side Effects (2)  Assessment: Axis I: Obsessive-compulsive disorder, Tourette's syndrome, Tic disorder, anxiety  disorder NOS  Axis II: Deferred  Axis III: See medical history Patient Active Problem List   Diagnosis Date Noted  . Sinusitis nasal 06/04/2012  . Hyperglycemia 06/04/2012  . Weight gain 06/04/2012  . Major depressive disorder, recurrent episode, severe, without mention of psychotic behavior 05/28/2012  . Generalized anxiety disorder 05/28/2012  . Varicose veins of lower extremities with other complications 04/01/2012  . OSA (obstructive sleep apnea) 02/14/2011  . Polycythemia 12/14/2010  . Cerumen impaction 06/24/2010  . VITAMIN D DEFICIENCY 12/25/2008  . SEBORRHEIC DERMATITIS 07/23/2008  . FREQUENCY, URINARY 07/23/2008  . CELLULITIS/ABSCESS, FOOT 09/11/2007  . GLUCOSE INTOLERANCE 04/11/2007  . ALLERGIC RHINITIS 04/11/2007  . GERD 04/11/2007  . BACK PAIN 04/11/2007    Axis IV: Moderate  Axis V: 50- 55   Plan:   I will discontinue trazodone.  Recommend to continue Remeron, Effexor and Abilify.  He has refills remaining on his Ativan which he uses as needed.  Discussed in detail the risks and benefits of medication.  Recommend to call us back if he  has any questions or concerns otherwise I will see him again in 2 months.  We are still awaiting records from [facility].  Patient was seen [facility] since 2002.  Followup in 2 months.  [name],SYED T., MD 10/24/2012

## 2012-12-06 ENCOUNTER — Encounter: Payer: Self-pay | Admitting: Internal Medicine

## 2012-12-06 ENCOUNTER — Ambulatory Visit (INDEPENDENT_AMBULATORY_CARE_PROVIDER_SITE_OTHER): Payer: Medicare Other | Admitting: Internal Medicine

## 2012-12-06 ENCOUNTER — Other Ambulatory Visit (INDEPENDENT_AMBULATORY_CARE_PROVIDER_SITE_OTHER): Payer: Medicare Other

## 2012-12-06 VITALS — BP 112/68 | HR 88 | Temp 97.0°F | Resp 16 | Wt 211.0 lb

## 2012-12-06 DIAGNOSIS — R141 Gas pain: Secondary | ICD-10-CM

## 2012-12-06 DIAGNOSIS — R140 Abdominal distension (gaseous): Secondary | ICD-10-CM

## 2012-12-06 DIAGNOSIS — S61209A Unspecified open wound of unspecified finger without damage to nail, initial encounter: Secondary | ICD-10-CM

## 2012-12-06 DIAGNOSIS — R635 Abnormal weight gain: Secondary | ICD-10-CM | POA: Diagnosis not present

## 2012-12-06 DIAGNOSIS — E559 Vitamin D deficiency, unspecified: Secondary | ICD-10-CM

## 2012-12-06 DIAGNOSIS — Z23 Encounter for immunization: Secondary | ICD-10-CM

## 2012-12-06 DIAGNOSIS — H6120 Impacted cerumen, unspecified ear: Secondary | ICD-10-CM

## 2012-12-06 DIAGNOSIS — H6123 Impacted cerumen, bilateral: Secondary | ICD-10-CM

## 2012-12-06 DIAGNOSIS — D45 Polycythemia vera: Secondary | ICD-10-CM

## 2012-12-06 DIAGNOSIS — D751 Secondary polycythemia: Secondary | ICD-10-CM

## 2012-12-06 LAB — CBC WITH DIFFERENTIAL/PLATELET
Basophils Relative: 0.6 % (ref 0.0–3.0)
Eosinophils Absolute: 0.2 10*3/uL (ref 0.0–0.7)
Eosinophils Relative: 5 % (ref 0.0–5.0)
Hemoglobin: 16.4 g/dL (ref 13.0–17.0)
Lymphocytes Relative: 26.4 % (ref 12.0–46.0)
MCHC: 34.3 g/dL (ref 30.0–36.0)
Monocytes Relative: 10.8 % (ref 3.0–12.0)
Neutro Abs: 2.8 10*3/uL (ref 1.4–7.7)
Neutrophils Relative %: 57.2 % (ref 43.0–77.0)
RBC: 5.4 Mil/uL (ref 4.22–5.81)
WBC: 4.8 10*3/uL (ref 4.5–10.5)

## 2012-12-06 LAB — HEPATIC FUNCTION PANEL
ALT: 35 U/L (ref 0–53)
AST: 26 U/L (ref 0–37)
Albumin: 4.5 g/dL (ref 3.5–5.2)
Bilirubin, Direct: 0 mg/dL (ref 0.0–0.3)
Total Protein: 7.5 g/dL (ref 6.0–8.3)

## 2012-12-06 LAB — URINALYSIS
Ketones, ur: NEGATIVE
Leukocytes, UA: NEGATIVE
Specific Gravity, Urine: 1.02 (ref 1.000–1.030)
Total Protein, Urine: NEGATIVE
Urine Glucose: NEGATIVE
pH: 8.5 (ref 5.0–8.0)

## 2012-12-06 LAB — BASIC METABOLIC PANEL
BUN: 11 mg/dL (ref 6–23)
CO2: 26 mEq/L (ref 19–32)
Chloride: 107 mEq/L (ref 96–112)
Glucose, Bld: 106 mg/dL — ABNORMAL HIGH (ref 70–99)
Potassium: 4.2 mEq/L (ref 3.5–5.1)

## 2012-12-06 LAB — TSH: TSH: 0.91 u[IU]/mL (ref 0.35–5.50)

## 2012-12-06 MED ORDER — DOXYCYCLINE HYCLATE 100 MG PO TABS: 100.0000 mg | ORAL_TABLET | Freq: Two times a day (BID) | ORAL | Status: DC

## 2012-12-06 NOTE — Assessment & Plan Note (Signed)
Discussed diet Labs 

## 2012-12-06 NOTE — Assessment & Plan Note (Signed)
Labs

## 2012-12-06 NOTE — Assessment & Plan Note (Signed)
Will irrigate 

## 2012-12-06 NOTE — Assessment & Plan Note (Addendum)
10/14 Obese Postural  Loose wt Labs Watch posture RTC if issues

## 2012-12-06 NOTE — Assessment & Plan Note (Signed)
CBC

## 2012-12-06 NOTE — Progress Notes (Signed)
  Subjective:    Patient ID: Bruce Ellison, male    DOB: 04-15-1980, 32 y.o.   MRN: [id number]  HPI  C/o R breast and abdomen enlargement, no pain C/o R 4th finger abcess  Wt Readings from Last 3 Encounters:  12/06/12 211 lb (95.709 kg)  10/24/12 208 lb (94.348 kg)  08/26/12 207 lb 6.4 oz (94.076 kg)      Review of Systems  Constitutional: Negative for appetite change, fatigue and unexpected weight change.  HENT: Negative for nosebleeds, congestion, sore throat, sneezing, trouble swallowing and neck pain.   Eyes: Negative for itching and visual disturbance.  Respiratory: Negative for cough, chest tightness and shortness of breath.   Cardiovascular: Negative for chest pain, palpitations and leg swelling.  Gastrointestinal: Negative for nausea, vomiting, abdominal pain, diarrhea, constipation, blood in stool and abdominal distention.  Genitourinary: Negative for frequency and hematuria.  Musculoskeletal: Negative for back pain, joint swelling and gait problem.  Skin: Negative for rash.  Neurological: Negative for dizziness, tremors, speech difficulty and weakness.  Psychiatric/Behavioral: Negative for sleep disturbance, dysphoric mood and agitation. The patient is not nervous/anxious.    Wax B    Objective:   Physical Exam     Procedure Note :     Procedure :  Ear irrigation   Indication:  Cerumen impaction B   Risks, including pain, dizziness, eardrum perforation, bleeding, infection and others as well as benefits were explained to the patient in detail. Verbal consent was obtained and the patient agreed to proceed.    We used "The Elephant Ear Irrigation Device" filled with lukewarm water for irrigation. A large amount wax was recovered. Procedure has also required manual wax removal with an ear loop.   Tolerated well. Complications: None.   Postprocedure instructions :  Call if problems.      Assessment & Plan:

## 2012-12-08 DIAGNOSIS — S61209A Unspecified open wound of unspecified finger without damage to nail, initial encounter: Secondary | ICD-10-CM | POA: Insufficient documentation

## 2012-12-08 NOTE — Assessment & Plan Note (Signed)
Dressed, instructions given

## 2012-12-11 ENCOUNTER — Ambulatory Visit (INDEPENDENT_AMBULATORY_CARE_PROVIDER_SITE_OTHER): Payer: Medicare Other | Admitting: Psychiatry

## 2012-12-11 ENCOUNTER — Encounter (HOSPITAL_COMMUNITY): Payer: Self-pay | Admitting: Psychiatry

## 2012-12-11 VITALS — BP 129/70 | HR 100 | Ht 67.0 in | Wt 208.0 lb

## 2012-12-11 DIAGNOSIS — F429 Obsessive-compulsive disorder, unspecified: Secondary | ICD-10-CM | POA: Diagnosis not present

## 2012-12-11 DIAGNOSIS — F411 Generalized anxiety disorder: Secondary | ICD-10-CM

## 2012-12-11 DIAGNOSIS — F959 Tic disorder, unspecified: Secondary | ICD-10-CM | POA: Diagnosis not present

## 2012-12-11 DIAGNOSIS — F952 Tourette's disorder: Secondary | ICD-10-CM | POA: Diagnosis not present

## 2012-12-11 MED ORDER — VENLAFAXINE HCL ER 75 MG PO CP24: 75.0000 mg | ORAL_CAPSULE | Freq: Every day | ORAL | Status: DC

## 2012-12-11 MED ORDER — ARIPIPRAZOLE 5 MG PO TABS: 5.0000 mg | ORAL_TABLET | Freq: Every day | ORAL | Status: DC

## 2012-12-11 MED ORDER — MIRTAZAPINE 15 MG PO TABS: 15.0000 mg | ORAL_TABLET | Freq: Every day | ORAL | Status: DC

## 2012-12-11 NOTE — Progress Notes (Signed)
[facility] [id number] Progress Note  JIM LUNDIN [id number] 32 y.o.  12/11/2012 1:53 PM  Chief Complaint: I am feeling dizzy.  I have nausea and sometimes abdominal pain.        History of Present Illness:  Patient is 32 year old [place] American unemployed single male who came 2 weeks earlier than his appointment.  Patient complained of nausea and feeling dizziness for past 2 days.  He recently seen his primary care physician however there has been no change in his medication.  Patient admitted sometime abdominal pain but denies any other symptoms.  He is taking his psychotropic medication.  He does not feed that his depression or anxiety is getting worse.  He has anxiety and nervousness because he is not feeling good about himself.  He is sleeping better.  He denies any agitation anger or mood swings.  He denies any irritability .  He admitted not taking Ativan for past 4 weeks because he does not have any panic attack.  He denies any crying spells , active all any passive suicidal thoughts.  He feels his OCD symptoms are under control.  Today his vitals are stable.  I reviewed his blood work which is unremarkable.  He denies any nightmares or any flashback.  He is not using drugs or any illegal substances.  Suicidal Ideation: No Plan Formed: No Patient has means to carry out plan: No  Homicidal Ideation: No Plan Formed: No Patient has means to carry out plan: No  Review of Systems: Psychiatric: Agitation: No Hallucination: No Depressed Mood: No Insomnia: No Hypersomnia: No Altered Concentration: No Feels Worthless: No Grandiose Ideas: No Belief In Special Powers: No New/Increased Substance Abuse: No Compulsions: Counting numbers   Review of Systems  Constitutional:       Sweating  Eyes: Negative.   Respiratory: Negative.   Cardiovascular: Negative.   Gastrointestinal: Positive for abdominal pain.  Genitourinary: Negative.   Musculoskeletal: Negative.  Negative for  falls and joint pain.  Skin: Negative.   Neurological: Positive for dizziness and headaches. Negative for tingling, tremors, sensory change, speech change, focal weakness and seizures.       Gait normal  Psychiatric/Behavioral: The patient is nervous/anxious.      Past Medical History  Diagnosis Date  . Anxiety   . OCD (obsessive compulsive disorder)   . Personality disorder     with schizoid features  . Tourette's     per [facility]  . Insomnia   . HTN (hypertension)     borderline  . Obesity   . Allergic rhinitis   . Glucose intolerance (impaired glucose tolerance)   . GERD (gastroesophageal reflux disease)   . Vitamin D deficiency   . Depression   . Sleep apnea    His primary care physician is Dr. Jory Ee.   Social history.   Patient was born in [place], [place]. [place].  At age 26 his parents got divorced.  He moved the [place] at age 55.  He is living with his mother and grandparents.  Patient father still lives in [place].  Patient has no contact with his father.  Patient has never married.  He has no children.  He has limited social network.  Outpatient Encounter Prescriptions as of 12/11/2012  Medication Sig Dispense Refill  . ARIPiprazole (ABILIFY) 5 MG tablet Take 1 tablet (5 mg total) by mouth at bedtime.  30 tablet  1  . mirtazapine (REMERON) 15 MG tablet Take 1 tablet (15 mg total) by mouth  at bedtime.  30 tablet  1  . Multiple Vitamin (MULTIVITAMIN WITH MINERALS) TABS Take 1 tablet by mouth every morning.      . venlafaxine XR (EFFEXOR-XR) 75 MG 24 hr capsule Take 1 capsule (75 mg total) by mouth daily.  30 capsule  1  . [DISCONTINUED] ARIPiprazole (ABILIFY) 5 MG tablet Take 1 tablet (5 mg total) by mouth at bedtime.  30 tablet  1  . [DISCONTINUED] mirtazapine (REMERON) 15 MG tablet Take 1 tablet (15 mg total) by mouth at bedtime.  30 tablet  1  . [DISCONTINUED] venlafaxine XR (EFFEXOR-XR) 75 MG 24 hr capsule Take 1 capsule (75 mg total) by mouth daily.  30 capsule  1   . LORazepam (ATIVAN) 0.5 MG tablet Take 1 tablet (0.5 mg total) by mouth as needed for anxiety.  15 tablet  0   No facility-administered encounter medications on file as of 12/11/2012.    Past Psychiatric History/Hospitalization(s): Anxiety: Patient has history of psychiatric illness at age 47.  At that time he was in [place].  He has Tic, anxiety and anger. Bipolar Disorder: No Depression: Yes, he had tried Zoloft, Prozac and recently Luvox. Mania: No Psychosis: Patient has history of paranoia.  In the past he had tried Geodon, Risperdal and Seroquel.  He was seeing psychiatrist at [facility].  In the past he was also to get treatment in [place].  He was seeing psychiatrist in [place] in [place].  He&amp;#39;s been taking the medication since age 45.  Recently his been seeing Dr. Evelene Croon who had prescribed her Zoloft, Valium, Klonopin, Xanax, Prozac and Ambien.  We also tried luvox however he developed more irritability. Schizophrenia: No Personality Disorder: No Hospitalization for psychiatric illness: No History of Electroconvulsive Shock Therapy: No Prior Suicide Attempts: No  Physical Exam: Constitutional:  BP 129/70  Pulse 100  Ht 5\' 7"  (1.702 m)  Wt 208 lb (94.348 kg)  BMI 32.57 kg/m2  Recent Results (from the past 2160 hour(s))  HEPATIC FUNCTION PANEL     Status: None   Collection Time    12/06/12 10:01 AM      Result Value Range   Total Bilirubin 0.6  0.3 - 1.2 mg/dL   Bilirubin, Direct 0.0  0.0 - 0.3 mg/dL   Alkaline Phosphatase 55  39 - 117 U/L   AST 26  0 - 37 U/L   ALT 35  0 - 53 U/L   Total Protein 7.5  6.0 - 8.3 g/dL   Albumin 4.5  3.5 - 5.2 g/dL  CBC WITH DIFFERENTIAL     Status: None   Collection Time    12/06/12 10:01 AM      Result Value Range   WBC 4.8  4.5 - 10.5 K/uL   RBC 5.40  4.22 - 5.81 Mil/uL   Hemoglobin 16.4  13.0 - 17.0 g/dL   HCT 16.1  09.6 - 04.5 %   MCV 88.7  78.0 - 100.0 fl   MCHC 34.3  30.0 - 36.0 g/dL   RDW 40.9  81.1 -  91.4 %   Platelets 265.0  150.0 - 400.0 K/uL   Neutrophils Relative % 57.2  43.0 - 77.0 %   Lymphocytes Relative 26.4  12.0 - 46.0 %   Monocytes Relative 10.8  3.0 - 12.0 %   Eosinophils Relative 5.0  0.0 - 5.0 %   Basophils Relative 0.6  0.0 - 3.0 %   Neutro Abs 2.8  1.4 - 7.7 K/uL  Lymphs Abs 1.3  0.7 - 4.0 K/uL   Monocytes Absolute 0.5  0.1 - 1.0 K/uL   Eosinophils Absolute 0.2  0.0 - 0.7 K/uL   Basophils Absolute 0.0  0.0 - 0.1 K/uL  BASIC METABOLIC PANEL     Status: Abnormal   Collection Time    12/06/12 10:01 AM      Result Value Range   Sodium 139  135 - 145 mEq/L   Potassium 4.2  3.5 - 5.1 mEq/L   Chloride 107  96 - 112 mEq/L   CO2 26  19 - 32 mEq/L   Glucose, Bld 106 (*) 70 - 99 mg/dL   BUN 11  6 - 23 mg/dL   Creatinine, Ser 0.9  0.4 - 1.5 mg/dL   Calcium 9.3  8.4 - 16.1 mg/dL   GFR 096.04  >54.09 mL/min  TSH     Status: None   Collection Time    12/06/12 10:01 AM      Result Value Range   TSH 0.91  0.35 - 5.50 uIU/mL  URINALYSIS     Status: None   Collection Time    12/06/12 10:01 AM      Result Value Range   Color, Urine LT. YELLOW  Yellow;Lt. Yellow   APPearance CLEAR  Clear   Specific Gravity, Urine 1.020  1.000-1.030   pH 8.5  5.0 - 8.0   Total Protein, Urine NEGATIVE  Negative   Urine Glucose NEGATIVE  Negative   Ketones, ur NEGATIVE  Negative   Bilirubin Urine NEGATIVE  Negative   Hgb urine dipstick NEGATIVE  Negative   Urobilinogen, UA 0.2  0.0 - 1.0   Leukocytes, UA NEGATIVE  Negative   Nitrite NEGATIVE  Negative    General Appearance: well nourished  Musculoskeletal: Strength & Muscle Tone: within normal limits Gait & Station: normal Patient leans: N/A  Psychiatric: Speech (describe rate, volume, coherence, spontaneity, and abnormalities if any): Slow but clear and coherent.  Thought Process (describe rate, content, abstract reasoning, and computation): Slow but logical and goal-directed.  Associations: Relevant, Intact and Obsessive  thoughts.  Thoughts: obsessions and Obsessive thoughts but no paranoia or delusion or grandiosity.  Mental Status: Orientation: oriented to person and place Mood & Affect: anxiety and Constricted affect. Attention Span & Concentration: Poor  Medical Decision Making (Choose Three): Established Problem, Stable/Improving (1), Review of Psycho-Social Stressors (1), Review of Last Therapy Session (1) and Review of Medication Regimen & Side Effects (2)  Assessment: Axis I: Obsessive-compulsive disorder, Tourette's syndrome, Tic disorder, anxiety disorder NOS  Axis II: Deferred  Axis III: See medical history Patient Active Problem List   Diagnosis Date Noted  . Finger wound, simple, open 12/08/2012  . Abdominal distention 12/06/2012  . Sinusitis nasal 06/04/2012  . Hyperglycemia 06/04/2012  . Weight gain 06/04/2012  . Major depressive disorder, recurrent episode, severe, without mention of psychotic behavior 05/28/2012  . Generalized anxiety disorder 05/28/2012  . Varicose veins of lower extremities with other complications 04/01/2012  . OSA (obstructive sleep apnea) 02/14/2011  . Polycythemia 12/14/2010  . Cerumen impaction 06/24/2010  . VITAMIN D DEFICIENCY 12/25/2008  . SEBORRHEIC DERMATITIS 07/23/2008  . FREQUENCY, URINARY 07/23/2008  . CELLULITIS/ABSCESS, FOOT 09/11/2007  . GLUCOSE INTOLERANCE 04/11/2007  . ALLERGIC RHINITIS 04/11/2007  . GERD 04/11/2007  . BACK PAIN 04/11/2007    Axis IV: Moderate  Axis V: 50- 55   Plan:   I recommend to see primary care physician since he has a lot of physical  symptoms.  I reviewed his blood work which is unremarkable.  His vitals are stable.  Patient must continue his current psychotropic medication.  I recommend the use Ativan as needed if he feeling more anxious and nervous.  I will continue his current medication which is Remeron 50 mg at bedtime, Effexor XR 75 mg daily and Abilify 5 mg daily.  Recommend to call us back if he has any  question or any concern.  Followup in 2 months.  Time spent 25 minutes.  More than 50% of the time spent in psychoeducation, counseling and coordination of care.  Discuss safety plan that anytime having active suicidal thoughts or homicidal thoughts then patient need to call 911 or go to the local emergency room.  Sione Baumgarten T., MD 12/11/2012

## 2012-12-12 ENCOUNTER — Ambulatory Visit: Payer: Self-pay | Admitting: Internal Medicine

## 2012-12-13 ENCOUNTER — Ambulatory Visit: Payer: Self-pay | Admitting: Internal Medicine

## 2012-12-24 ENCOUNTER — Ambulatory Visit (HOSPITAL_COMMUNITY): Payer: Self-pay | Admitting: Psychiatry

## 2013-01-11 ENCOUNTER — Ambulatory Visit: Payer: Self-pay | Admitting: Family Medicine

## 2013-01-13 ENCOUNTER — Telehealth: Payer: Self-pay | Admitting: *Deleted

## 2013-01-13 NOTE — Telephone Encounter (Signed)
ToRoma Schanz Fax: [phone] From: [facility] Date/ Time: 01/11/2013 7:39 AM Taken By: Katherine Mantle, CSR Caller: Doss Facility: not collected Patient: Bruce Ellison, Bruce Ellison DOB: Sep 01, 1980 Phone: [phone] Reason for Call: See info below Regarding Appointment: Yes Appt Date: 01/11/2013 Appt Time: 10:45:00 AM Provider: Sonda Primes (Adults only) Reason: Details: Patient feeling better. Outcome: Cancelled appointment in [facility])

## 2013-02-11 ENCOUNTER — Encounter (HOSPITAL_COMMUNITY): Payer: Self-pay | Admitting: Psychiatry

## 2013-02-11 ENCOUNTER — Ambulatory Visit (INDEPENDENT_AMBULATORY_CARE_PROVIDER_SITE_OTHER): Payer: Medicare Other | Admitting: Psychiatry

## 2013-02-11 DIAGNOSIS — F952 Tourette's disorder: Secondary | ICD-10-CM | POA: Diagnosis not present

## 2013-02-11 DIAGNOSIS — F959 Tic disorder, unspecified: Secondary | ICD-10-CM | POA: Diagnosis not present

## 2013-02-11 DIAGNOSIS — F411 Generalized anxiety disorder: Secondary | ICD-10-CM

## 2013-02-11 DIAGNOSIS — F429 Obsessive-compulsive disorder, unspecified: Secondary | ICD-10-CM

## 2013-02-11 MED ORDER — MIRTAZAPINE 15 MG PO TABS: 15.0000 mg | ORAL_TABLET | Freq: Every day | ORAL | Status: DC

## 2013-02-11 MED ORDER — ARIPIPRAZOLE 5 MG PO TABS: 5.0000 mg | ORAL_TABLET | Freq: Every day | ORAL | Status: DC

## 2013-02-11 MED ORDER — VENLAFAXINE HCL ER 75 MG PO CP24: 75.0000 mg | ORAL_CAPSULE | Freq: Every day | ORAL | Status: DC

## 2013-02-12 ENCOUNTER — Encounter (HOSPITAL_COMMUNITY): Payer: Self-pay | Admitting: Psychiatry

## 2013-02-12 NOTE — Progress Notes (Signed)
[facility] [id number] Progress Note  MCCRAE SPECIALE [id number] 32 y.o.  02/12/2013 8:43 AM  Chief Complaint: Medication management and followup.          History of Present Illness:  Bruce Ellison is 32 year old [place] unemployed single male who came for his followup appointment.  He is compliant with his psychotropic medication.  He denies any recent complain of dizziness or any fall.  He likes his current psychotropic medication.  He complains sometimes anxious and nightmares however he does not want to change his Remeron.  He denies any recent worsening of OCD symptoms.  He denies any crying spells.  He had a good Thanksgiving and he is excited about Christmas.  He is sleeping better.  He denies any agitation, anger or any suicidal thoughts.  Suicidal Ideation: No Plan Formed: No Patient has means to carry out plan: No  Homicidal Ideation: No Plan Formed: No Patient has means to carry out plan: No  Review of Systems: Psychiatric: Agitation: No Hallucination: No Depressed Mood: No Insomnia: No Hypersomnia: No Altered Concentration: No Feels Worthless: No Grandiose Ideas: No Belief In Special Powers: No New/Increased Substance Abuse: No Compulsions: Counting numbers   ROS   Past Medical History  Diagnosis Date  . Anxiety   . OCD (obsessive compulsive disorder)   . Personality disorder     with schizoid features  . Tourette's     per [facility]  . Insomnia   . HTN (hypertension)     borderline  . Obesity   . Allergic rhinitis   . Glucose intolerance (impaired glucose tolerance)   . GERD (gastroesophageal reflux disease)   . Vitamin D deficiency   . Depression   . Sleep apnea    His primary care physician is Dr. Jory Ee.   Social history.   Patient was born in [place], [place]. [place].  At age 35 his parents got divorced.  He moved the [place] at age 55.  He is living with his mother and grandparents.  Patient father still lives in [place].  Patient has no  contact with his father.  Patient has never married.  He has no children.  He has limited social network.  Outpatient Encounter Prescriptions as of 02/11/2013  Medication Sig  . ARIPiprazole (ABILIFY) 5 MG tablet Take 1 tablet (5 mg total) by mouth at bedtime.  . mirtazapine (REMERON) 15 MG tablet Take 1 tablet (15 mg total) by mouth at bedtime.  . Multiple Vitamin (MULTIVITAMIN WITH MINERALS) TABS Take 1 tablet by mouth every morning.  . venlafaxine XR (EFFEXOR-XR) 75 MG 24 hr capsule Take 1 capsule (75 mg total) by mouth daily.  . [DISCONTINUED] ARIPiprazole (ABILIFY) 5 MG tablet Take 1 tablet (5 mg total) by mouth at bedtime.  . [DISCONTINUED] LORazepam (ATIVAN) 0.5 MG tablet Take 1 tablet (0.5 mg total) by mouth as needed for anxiety.  . [DISCONTINUED] mirtazapine (REMERON) 15 MG tablet Take 1 tablet (15 mg total) by mouth at bedtime.  . [DISCONTINUED] venlafaxine XR (EFFEXOR-XR) 75 MG 24 hr capsule Take 1 capsule (75 mg total) by mouth daily.    Past Psychiatric History/Hospitalization(s): Anxiety: Patient has history of psychiatric illness at age 14.  At that time he was in [place].  He has Tic, anxiety and anger. Bipolar Disorder: No Depression: Yes, he had tried Zoloft, Prozac and recently Luvox. Mania: No Psychosis: Patient has history of paranoia.  In the past he had tried Geodon, Risperdal and Seroquel.  He was seeing psychiatrist at  [facility].  In the past he was also to get treatment in [place].  He was seeing psychiatrist in [place] in [place].  He&amp;#39;s been taking the medication since age 30.  Recently his been seeing Dr. Evelene Croon who had prescribed her Zoloft, Valium, Klonopin, Xanax, Prozac and Ambien.  We also tried luvox however he developed more irritability. Schizophrenia: No Personality Disorder: No Hospitalization for psychiatric illness: No History of Electroconvulsive Shock Therapy: No Prior Suicide Attempts: No  Physical Exam: Constitutional:   There were no vitals taken for this visit.  Recent Results (from the past 2160 hour(s))  HEPATIC FUNCTION PANEL     Status: None   Collection Time    12/06/12 10:01 AM      Result Value Range   Total Bilirubin 0.6  0.3 - 1.2 mg/dL   Bilirubin, Direct 0.0  0.0 - 0.3 mg/dL   Alkaline Phosphatase 55  39 - 117 U/L   AST 26  0 - 37 U/L   ALT 35  0 - 53 U/L   Total Protein 7.5  6.0 - 8.3 g/dL   Albumin 4.5  3.5 - 5.2 g/dL  CBC WITH DIFFERENTIAL     Status: None   Collection Time    12/06/12 10:01 AM      Result Value Range   WBC 4.8  4.5 - 10.5 K/uL   RBC 5.40  4.22 - 5.81 Mil/uL   Hemoglobin 16.4  13.0 - 17.0 g/dL   HCT 16.1  09.6 - 04.5 %   MCV 88.7  78.0 - 100.0 fl   MCHC 34.3  30.0 - 36.0 g/dL   RDW 40.9  81.1 - 91.4 %   Platelets 265.0  150.0 - 400.0 K/uL   Neutrophils Relative % 57.2  43.0 - 77.0 %   Lymphocytes Relative 26.4  12.0 - 46.0 %   Monocytes Relative 10.8  3.0 - 12.0 %   Eosinophils Relative 5.0  0.0 - 5.0 %   Basophils Relative 0.6  0.0 - 3.0 %   Neutro Abs 2.8  1.4 - 7.7 K/uL   Lymphs Abs 1.3  0.7 - 4.0 K/uL   Monocytes Absolute 0.5  0.1 - 1.0 K/uL   Eosinophils Absolute 0.2  0.0 - 0.7 K/uL   Basophils Absolute 0.0  0.0 - 0.1 K/uL  BASIC METABOLIC PANEL     Status: Abnormal   Collection Time    12/06/12 10:01 AM      Result Value Range   Sodium 139  135 - 145 mEq/L   Potassium 4.2  3.5 - 5.1 mEq/L   Chloride 107  96 - 112 mEq/L   CO2 26  19 - 32 mEq/L   Glucose, Bld 106 (*) 70 - 99 mg/dL   BUN 11  6 - 23 mg/dL   Creatinine, Ser 0.9  0.4 - 1.5 mg/dL   Calcium 9.3  8.4 - 78.2 mg/dL   GFR 956.21  >30.86 mL/min  TSH     Status: None   Collection Time    12/06/12 10:01 AM      Result Value Range   TSH 0.91  0.35 - 5.50 uIU/mL  URINALYSIS     Status: None   Collection Time    12/06/12 10:01 AM      Result Value Range   Color, Urine LT. YELLOW  Yellow;Lt. Yellow   APPearance CLEAR  Clear   Specific Gravity, Urine 1.020  1.000-1.030   pH 8.5  5.0 -  8.0   Total Protein, Urine NEGATIVE  Negative   Urine Glucose NEGATIVE  Negative   Ketones, ur NEGATIVE  Negative   Bilirubin Urine NEGATIVE  Negative   Hgb urine dipstick NEGATIVE  Negative   Urobilinogen, UA 0.2  0.0 - 1.0   Leukocytes, UA NEGATIVE  Negative   Nitrite NEGATIVE  Negative    General Appearance: well nourished  Musculoskeletal: Strength & Muscle Tone: within normal limits Gait & Station: normal Patient leans: N/A  Psychiatric: Speech (describe rate, volume, coherence, spontaneity, and abnormalities if any): Slow but clear and coherent.  Thought Process (describe rate, content, abstract reasoning, and computation): Slow but logical and goal-directed.  Associations: Relevant, Intact and Obsessive thoughts.  Thoughts: obsessions and Obsessive thoughts but no paranoia or delusion or grandiosity.  Mental Status: Orientation: oriented to person and place Mood & Affect: anxiety and Constricted affect. Attention Span & Concentration: Poor  Medical Decision Making (Choose Three): Established Problem, Stable/Improving (1), Review of Psycho-Social Stressors (1), Review of Last Therapy Session (1) and Review of Medication Regimen & Side Effects (2)  Assessment: Axis I: Obsessive-compulsive disorder, Tourette's syndrome, Tic disorder, anxiety disorder NOS  Axis II: Deferred  Axis III: See medical history Patient Active Problem List   Diagnosis Date Noted  . Finger wound, simple, open 12/08/2012  . Abdominal distention 12/06/2012  . Sinusitis nasal 06/04/2012  . Hyperglycemia 06/04/2012  . Weight gain 06/04/2012  . Major depressive disorder, recurrent episode, severe, without mention of psychotic behavior 05/28/2012  . Generalized anxiety disorder 05/28/2012  . Varicose veins of lower extremities with other complications 04/01/2012  . OSA (obstructive sleep apnea) 02/14/2011  . Polycythemia 12/14/2010  . Cerumen impaction 06/24/2010  . VITAMIN D DEFICIENCY  12/25/2008  . SEBORRHEIC DERMATITIS 07/23/2008  . FREQUENCY, URINARY 07/23/2008  . CELLULITIS/ABSCESS, FOOT 09/11/2007  . GLUCOSE INTOLERANCE 04/11/2007  . ALLERGIC RHINITIS 04/11/2007  . GERD 04/11/2007  . BACK PAIN 04/11/2007    Axis IV: Moderate  Axis V: 50- 55   Plan:   I will continue Abilify, Effexor and Remeron at present dose.  Recommend to call us back if he has any question or any concern.  Followup in 3 months.  Gayle Martinez T., MD 02/12/2013

## 2013-03-17 ENCOUNTER — Ambulatory Visit (INDEPENDENT_AMBULATORY_CARE_PROVIDER_SITE_OTHER): Payer: Medicare Other | Admitting: Psychiatry

## 2013-03-17 ENCOUNTER — Encounter (HOSPITAL_COMMUNITY): Payer: Self-pay | Admitting: Psychiatry

## 2013-03-17 VITALS — BP 135/70 | HR 85 | Ht 67.0 in | Wt 210.2 lb

## 2013-03-17 DIAGNOSIS — F411 Generalized anxiety disorder: Secondary | ICD-10-CM | POA: Diagnosis not present

## 2013-03-17 DIAGNOSIS — F952 Tourette's disorder: Secondary | ICD-10-CM | POA: Diagnosis not present

## 2013-03-17 DIAGNOSIS — F429 Obsessive-compulsive disorder, unspecified: Secondary | ICD-10-CM

## 2013-03-17 DIAGNOSIS — F959 Tic disorder, unspecified: Secondary | ICD-10-CM | POA: Diagnosis not present

## 2013-03-17 MED ORDER — MIRTAZAPINE 30 MG PO TABS: 30.0000 mg | ORAL_TABLET | Freq: Every day | ORAL | Status: DC

## 2013-03-17 NOTE — Progress Notes (Signed)
[facility] [phone] Progress Note  [place] [id number] 33 y.o.  03/17/2013 3:40 PM  Chief Complaint: Feeling more anxious and nervous.            History of Present Illness:  [place] came for his appointment earlier than his scheduled appointment.  He is complaining of increased anxiety and nervousness.  He has noticed the past one week increase panic attack , sweating and anxiety symptoms.  He is very concerned about his grandmother who is suffering from medical issues .  Patient told his grandmother is DO NOT RESUSCITATE and currently under hospice .  Patient admitted that his mother is also a concern on the situation.  Patient to sometime very anxious and worried about his mother and his grandmother.  He also endorsed worsening of the OCD symptoms .  He admitted not driving and ask his stepfather to bring him for his appointment.  He feels that current medicine is not working.  He wants to go back on Remeron 30 mg.  He is compliant with his Effexor, Abilify and Remeron.  He denies any tremors or shakes.  He also endorsed taking Ativan 2 days ago did help some of his anxiety but he was very sleepy and lethargic.  Usually he does not take Ativan unless he has a severe panic attack.  Patient is not drinking or using any illegal substances.  He had a quiet Christmas.  He denies any suicidal thoughts or homicidal thoughts.  He denies any aggression or violence.  He denies any paranoia or any psychosis.  Suicidal Ideation: No Plan Formed: No Patient has means to carry out plan: No  Homicidal Ideation: No Plan Formed: No Patient has means to carry out plan: No  Review of Systems: Psychiatric: Agitation: No Hallucination: No Depressed Mood: Yes Insomnia: Yes Hypersomnia: No Altered Concentration: No Feels Worthless: No Grandiose Ideas: No Belief In Special Powers: No New/Increased Substance Abuse: No Compulsions: Counting numbers   ROS   Past Medical History  Diagnosis Date   . Anxiety   . OCD (obsessive compulsive disorder)   . Personality disorder     with schizoid features  . Tourette's     per [facility]  . Insomnia   . HTN (hypertension)     borderline  . Obesity   . Allergic rhinitis   . Glucose intolerance (impaired glucose tolerance)   . GERD (gastroesophageal reflux disease)   . Vitamin D deficiency   . Depression   . Sleep apnea    His primary care physician is Dr. Christianne Borrow.   Social history.   [place]  At age 26 his parents got divorced.  He moved the [place] at age 67.  He is living with his mother and grandparents.  Patient father still lives in [place].  Patient has no contact with his father.  Patient has never married.  He has no children.  He has limited social network.  Outpatient Encounter Prescriptions as of 03/17/2013  Medication Sig  . ARIPiprazole (ABILIFY) 5 MG tablet Take 1 tablet (5 mg total) by mouth at bedtime.  . mirtazapine (REMERON) 30 MG tablet Take 1 tablet (30 mg total) by mouth at bedtime.  . Multiple Vitamin (MULTIVITAMIN WITH MINERALS) TABS Take 1 tablet by mouth every morning.  . venlafaxine XR (EFFEXOR-XR) 75 MG 24 hr capsule Take 1 capsule (75 mg total) by mouth daily.  . [DISCONTINUED] mirtazapine (REMERON) 15 MG tablet Take 1 tablet (15  mg total) by mouth at bedtime.    Past Psychiatric History/Hospitalization(s): Anxiety: Patient has history of psychiatric illness at age 82.  At that time he was in [place].  He has Tic, anxiety and anger. Bipolar Disorder: No Depression: Yes, he had tried Zoloft, Prozac and recently Luvox. Mania: No Psychosis: Patient has history of paranoia.  In the past he had tried Geodon, Risperdal and Seroquel.  He was seeing psychiatrist at [place].  In the past he was also to get treatment in [place].  He was seeing psychiatrist in [place] in [place].  He&amp;#39;s been taking the medication since age 2.  Recently his been seeing Dr.  Toy Care who had prescribed her Zoloft, Valium, Klonopin, Xanax, Prozac and Ambien.  We also tried luvox however he developed more irritability. Schizophrenia: No Personality Disorder: No Hospitalization for psychiatric illness: No History of Electroconvulsive Shock Therapy: No Prior Suicide Attempts: No  Physical Exam: Constitutional:  BP 135/70  Pulse 85  Ht 5\' 7"  (1.702 m)  Wt 210 lb 3.2 oz (95.346 kg)  BMI 32.91 kg/m2  No results found for this or any previous visit (from the past 2160 hour(s)).  General Appearance: well nourished  Musculoskeletal: Strength & Muscle Tone: within normal limits Gait & Station: normal Patient leans: N/A  Psychiatric: Speech (describe rate, volume, coherence, spontaneity, and abnormalities if any): Slow but clear and coherent.  Thought Process (describe rate, content, abstract reasoning, and computation): Slow but logical and goal-directed.  Associations: Relevant, Intact and Obsessive thoughts.  Thoughts: obsessions and Obsessive thoughts but no paranoia or delusion or grandiosity.  Mental Status: Orientation: oriented to person and place Mood & Affect: anxiety and Constricted affect. Attention Span & Concentration: Poor  Medical Decision Making (Choose Three): Established Problem, Stable/Improving (1), Review of Psycho-Social Stressors (1), Established Problem, Worsening (2), Review of Last Therapy Session (1), Review of Medication Regimen & Side Effects (2) and Review of New Medication or Change in Dosage (2)  Assessment: Axis I: Obsessive-compulsive disorder, Tourette's syndrome, Tic disorder, anxiety disorder NOS  Axis II: Deferred  Axis III: See medical history Patient Active Problem List   Diagnosis Date Noted  . Finger wound, simple, open 12/08/2012  . Abdominal distention 12/06/2012  . Sinusitis nasal 06/04/2012  . Hyperglycemia 06/04/2012  . Weight gain 06/04/2012  . Major depressive disorder, recurrent episode, severe,  without mention of psychotic behavior 05/28/2012  . Generalized anxiety disorder 05/28/2012  . Varicose veins of lower extremities with other complications [id number]  . OSA (obstructive sleep apnea) 02/14/2011  . Polycythemia 12/14/2010  . Cerumen impaction 06/24/2010  . VITAMIN D DEFICIENCY 12/25/2008  . SEBORRHEIC DERMATITIS 07/23/2008  . FREQUENCY, URINARY 07/23/2008  . CELLULITIS/ABSCESS, FOOT 09/11/2007  . GLUCOSE INTOLERANCE 04/11/2007  . ALLERGIC RHINITIS 04/11/2007  . GERD 04/11/2007  . BACK PAIN 04/11/2007    Axis IV: Moderate  Axis V: 50- 55   Plan:   Reassurance given.  I recommended counseling the patient refused because he wanted to spend more time with his grandmother .  I would increase Remeron 30 mg .  I will continue Effexor and Abilify at present dose.  Followup in 6 weeks.  Recommend to call us back if he has any question or any concern.  Time spent 25 minutes.  More than 50% of the time spent in psychoeducation, counseling and coordination of care.  Discuss safety plan that anytime having active suicidal thoughts or homicidal thoughts then patient need to call 911 or go to  the local emergency room.  Furious Chiarelli T., MD 03/17/2013

## 2013-03-24 ENCOUNTER — Ambulatory Visit (INDEPENDENT_AMBULATORY_CARE_PROVIDER_SITE_OTHER): Payer: Medicare Other | Admitting: Family Medicine

## 2013-03-24 ENCOUNTER — Telehealth: Payer: Self-pay | Admitting: Internal Medicine

## 2013-03-24 ENCOUNTER — Encounter: Payer: Self-pay | Admitting: Family Medicine

## 2013-03-24 VITALS — BP 126/60 | HR 96 | Temp 98.6°F | Resp 16 | Wt 213.0 lb

## 2013-03-24 DIAGNOSIS — M999 Biomechanical lesion, unspecified: Secondary | ICD-10-CM

## 2013-03-24 DIAGNOSIS — M5380 Other specified dorsopathies, site unspecified: Secondary | ICD-10-CM

## 2013-03-24 DIAGNOSIS — M62830 Muscle spasm of back: Secondary | ICD-10-CM | POA: Insufficient documentation

## 2013-03-24 IMAGING — CT CT HEAD W/O CM
1 series · 16 of 30 positions shown, 20 images · non-contrast
Comparison: None.

CLINICAL DATA: Dizziness, headache, blurred vision for 2 months

CT HEAD WITHOUT CONTRAST
TECHNIQUE: Contiguous axial images were obtained from the base of
the skull through the vertex without contrast.

[Series 2: head_seq -c 4.5 h37s st · axial · 0.43mm/px · z∈[-136,+12]mm · 16 of 36 slices shown, 20 images]
[im 2/36  brain]
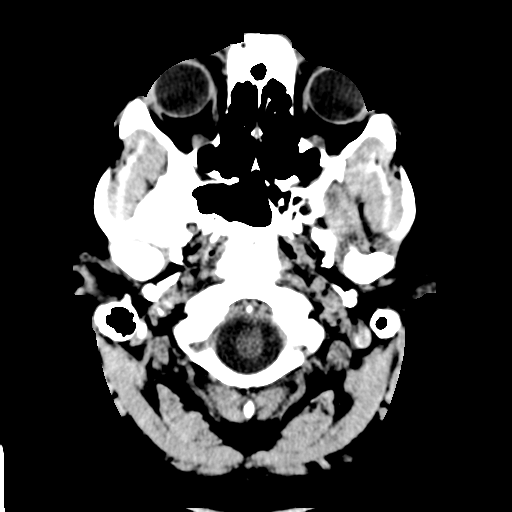
[im 2/36  bone]
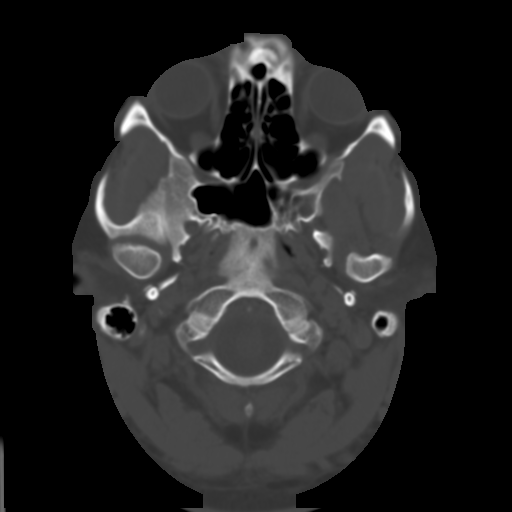
[im 4/36  brain]
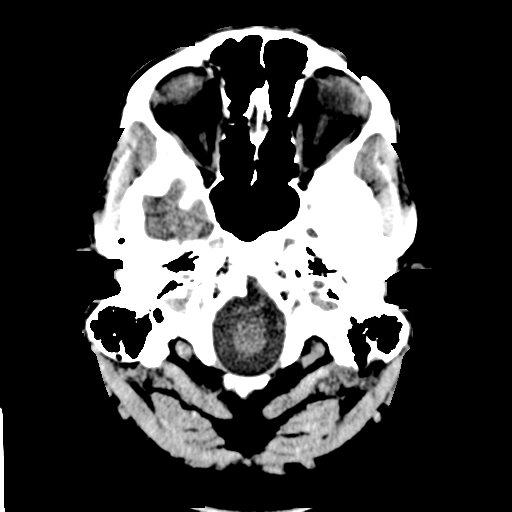
[im 7/36  brain]
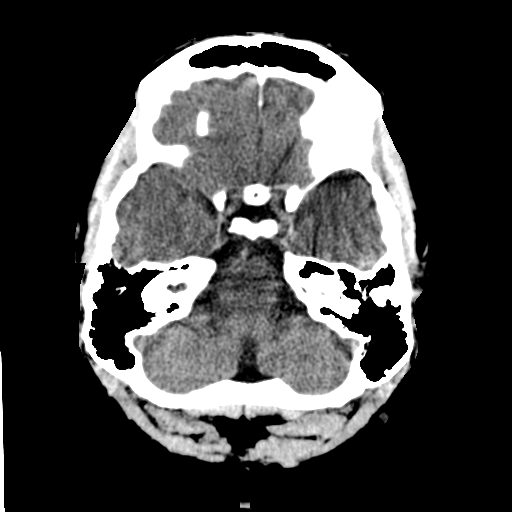
[im 9/36  brain]
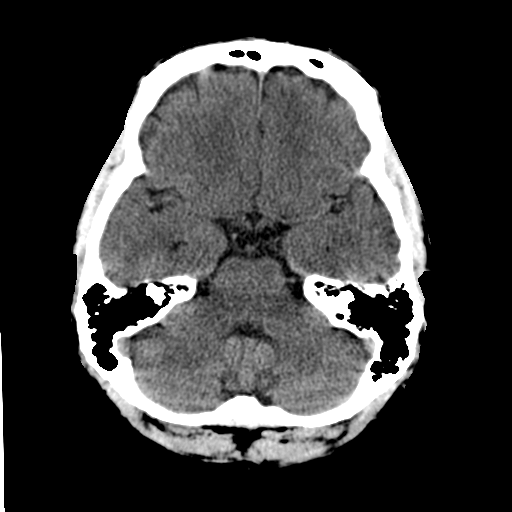
[im 10/36  brain]
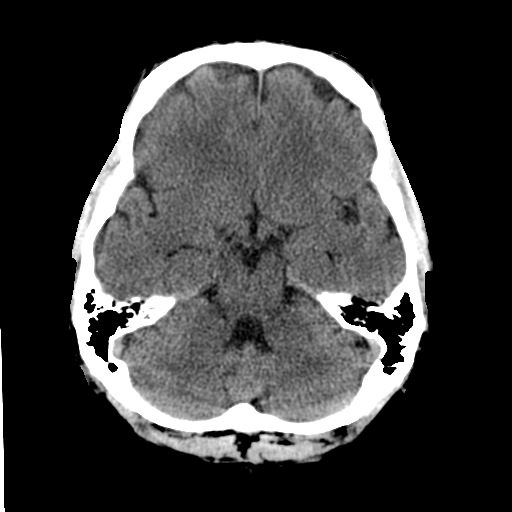
[im 10/36  bone]
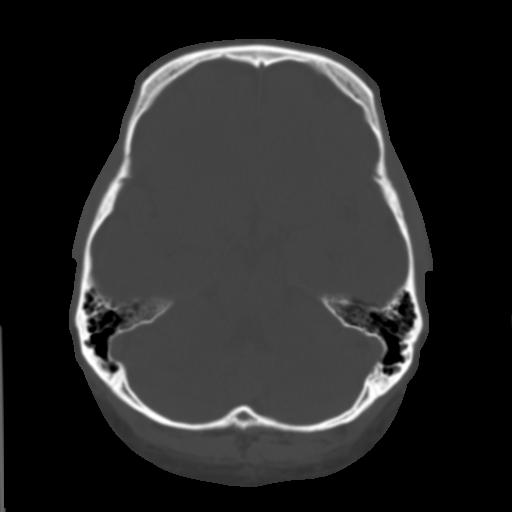
[im 13/36  brain]
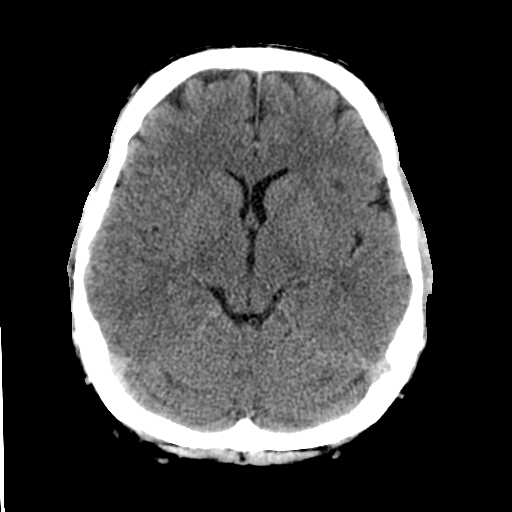
[im 15/36  brain]
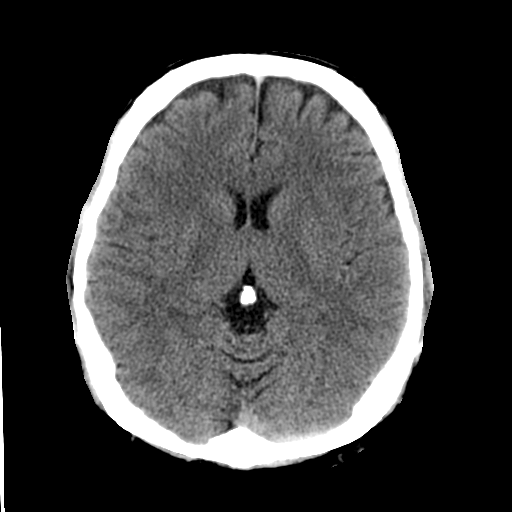
[im 17/36  brain]
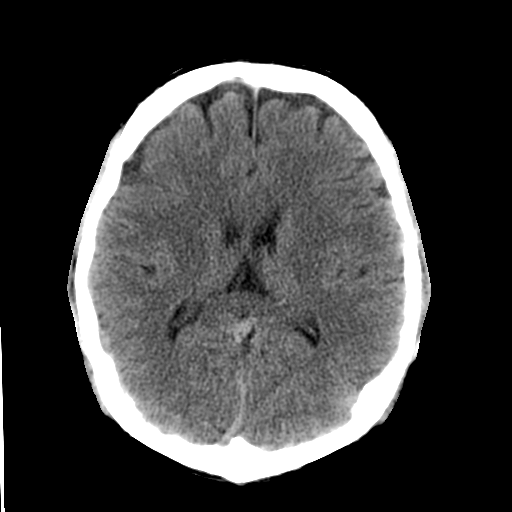
[im 19/36  brain]
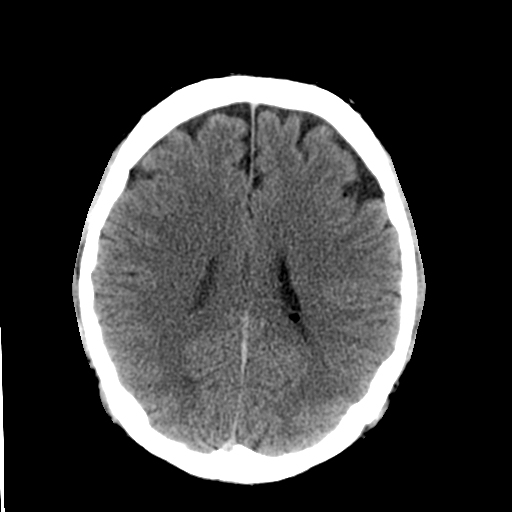
[im 19/36  bone]
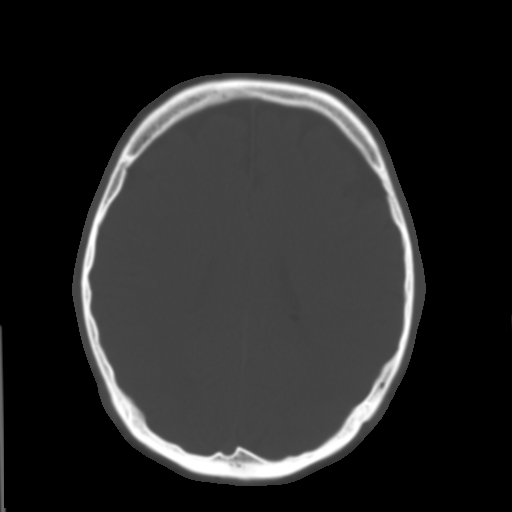
[im 21/36  brain]
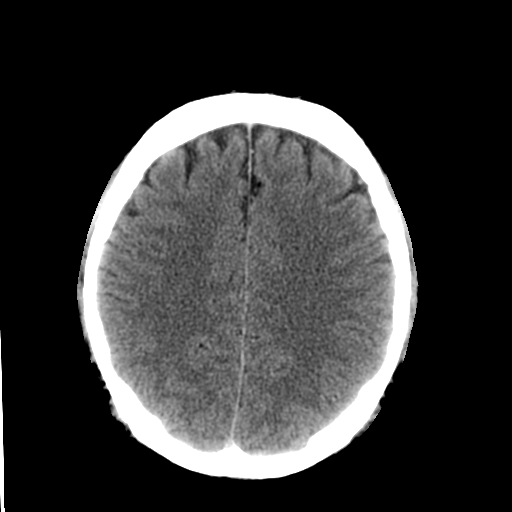
[im 23/36  brain]
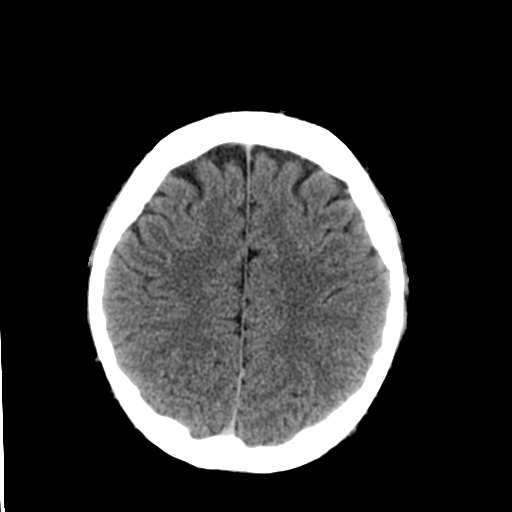
[im 26/36  brain]
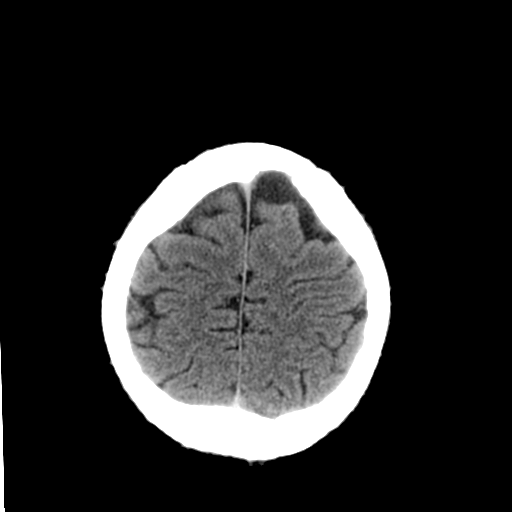
[im 27/36  brain]
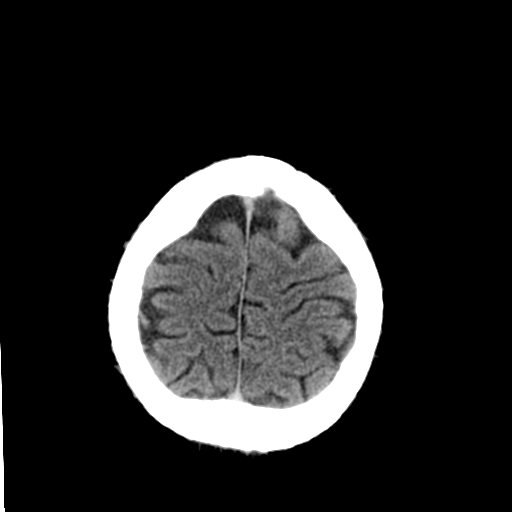
[im 27/36  bone]
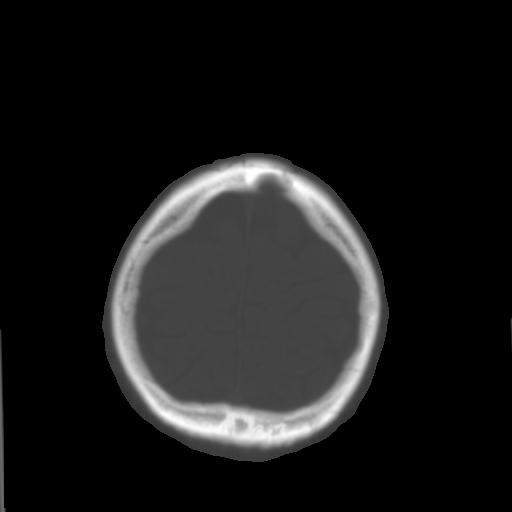
[im 29/36  brain]
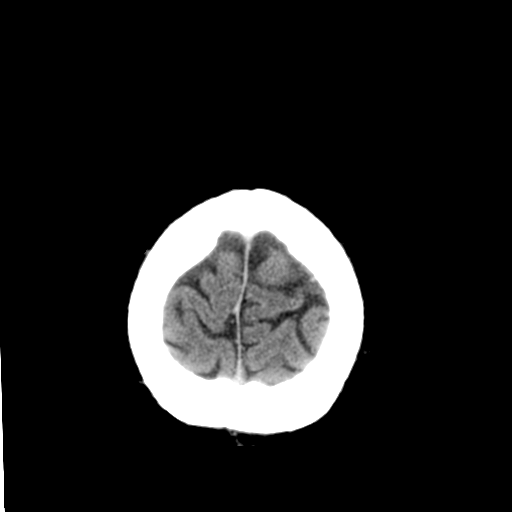
[im 32/36  brain]
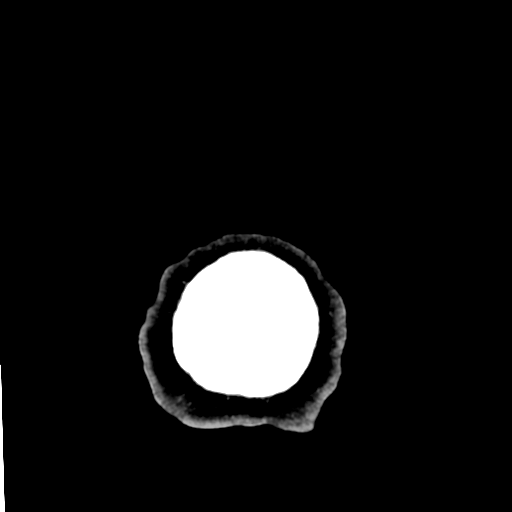
[im 34/36  brain]
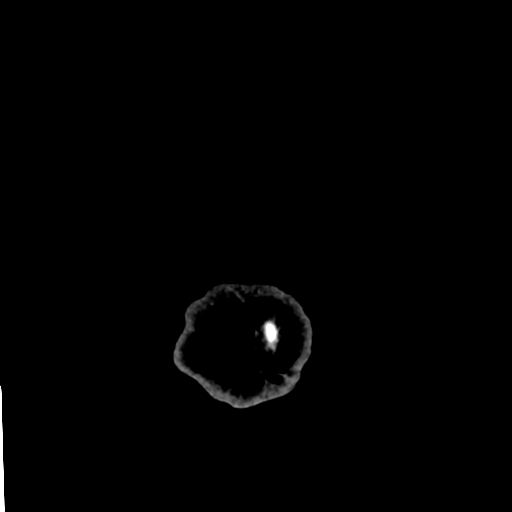

[16 of 30 positions shown; findings below may reference images not displayed]

FINDINGS: The ventricular system is normal in size and
configuration, and the septum is in a normal midline position.  The
fourth ventricle and basilar cisterns appear normal.  No
hemorrhage, mass lesion, or acute infarction is seen.  No calvarial
abnormality is seen.  The paranasal sinuses are well pneumatized,
as are the mastoid air cells.
IMPRESSION: Negative unenhanced CT of the brain.

## 2013-03-24 NOTE — Telephone Encounter (Signed)
Spoke with pt, advised of MDs message 

## 2013-03-24 NOTE — Progress Notes (Signed)
  CC: Left back pain.   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 Patient is November any true injury but was helping his grandmother with transfers recently. Patient denies any radiation except states that he can hurt on the left side and go towards his back. Patient describes it is more of a dull aching sensation with no significant radiation or any weakness of the protruding. Patient is there had this pain previously. Patient rates the pain as 5/10. Has not tried any home remedies at this time. :  Past medical, surgical, family and social history reviewed. Medications reviewed all in the electronic medical record.   Review of Systems: No headache, visual changes, nausea, vomiting, diarrhea, constipation, dizziness, abdominal pain, skin rash, fevers, chills, night sweats, weight loss, swollen lymph nodes, body aches, joint swelling, muscle aches, chest pain, shortness of breath, mood changes.   Objective:    Blood pressure 126/60, pulse 96, temperature 98.6 F (37 C), temperature source Oral, resp. rate 16, weight 213 lb (96.616 kg), SpO2 97.00%.   General: No apparent distress alert and oriented x3 mood and affect normal, dressed appropriately.  HEENT: Pupils equal, extraocular movements intact Respiratory: Patient's speak in full sentences and does not appear short of breath Cardiovascular: No lower extremity edema, non tender, no erythema Skin: Warm dry intact with no signs of infection or rash on extremities or on axial skeleton. Abdomen: Soft nontender Neuro: Cranial nerves II through XII are intact, neurovascularly intact in all extremities with 2+ DTRs and 2+ pulses. Lymph: No lymphadenopathy of posterior or anterior cervical chain or axillae bilaterally.  Gait normal with good balance and coordination.  MSK: Non tender with full range of motion and good stability and symmetric  strength and tone of shoulders, elbows, wrist, hip, knee and ankles bilaterally.  Back Exam:  Inspection: Unremarkable  Motion: Flexion 45 deg, Extension 45 deg, Side Bending to 45 deg bilaterally,  Rotation to 45 deg bilaterally  SLR laying: Negative  XSLR laying: Negative  Palpable tenderness: Mild pain along the scapular medial spine. On the left side. FABER: negative. Sensory change: Gross sensation intact to all lumbar and sacral dermatomes.  Reflexes: 2+ at both patellar tendons, 2+ at achilles tendons, Babinski's downgoing.  Strength at foot  Plantar-flexion: 5/5 Dorsi-flexion: 5/5 Eversion: 5/5 Inversion: 5/5  Leg strength  Quad: 5/5 Hamstring: 5/5 Hip flexor: 5/5 Hip abductors: 5/5  Gait unremarkable.  Osteopathic findings T5 extended rotated and side bent left with elevated inhaled fifth rib  Impression and Recommendations:     This case required medical decision making of moderate complexity.

## 2013-03-24 NOTE — Progress Notes (Signed)
Pre-visit discussion using our clinic review tool. No additional management support is needed unless otherwise documented below in the visit note.  

## 2013-03-24 NOTE — Telephone Encounter (Signed)
Pt wants to know if he has any weight restrictions for his job.  He will need a letter if there is a restriction.

## 2013-03-24 NOTE — Assessment & Plan Note (Signed)
Patient did have what appeared to be a slipped rib and did respond to osteopathic manipulation. Patient was given home exercise program and range of motion exercises. Patient will do a short course of ibuprofen for 3 days and we discussed icing protocol. Patient return in one week if he is not completely better or can come back in 3-4 weeks for continued manipulation therapy if it is beneficial.

## 2013-03-24 NOTE — Patient Instructions (Signed)
Very nice to meet you Try exercises most days of the week.  Icy hot or ice pack 2 times a day Ibuprofen 600mg  three times a day for 3 days Come back in 1 week if not better and I will do exercises.  If manipulation help you can come back every 3-4 weeks.

## 2013-03-24 NOTE — Telephone Encounter (Signed)
No, but be careful lifting with both arms.

## 2013-03-24 NOTE — Assessment & Plan Note (Signed)
Decision today to treat with OMT was based on Physical Exam  After verbal consent patient was treated with HVLA techniques in thoracic areas  Patient tolerated the procedure well with improvement in symptoms  Patient given exercises, stretches and lifestyle modifications  See medications in patient instructions if given  Patient will follow up in 3-4 weeks

## 2013-03-26 ENCOUNTER — Encounter: Payer: Self-pay | Admitting: Family Medicine

## 2013-03-26 ENCOUNTER — Ambulatory Visit (INDEPENDENT_AMBULATORY_CARE_PROVIDER_SITE_OTHER)
Admission: RE | Admit: 2013-03-26 | Discharge: 2013-03-26 | Disposition: A | Discharge status: Home / Self Care | Payer: Medicare Other | Source: Ambulatory Visit | Attending: Family Medicine | Admitting: Family Medicine

## 2013-03-26 ENCOUNTER — Encounter: Payer: Self-pay | Admitting: *Deleted

## 2013-03-26 ENCOUNTER — Ambulatory Visit (INDEPENDENT_AMBULATORY_CARE_PROVIDER_SITE_OTHER): Payer: Medicare Other | Admitting: Family Medicine

## 2013-03-26 VITALS — BP 136/80 | HR 96 | Temp 98.5°F | Resp 16 | Ht 67.0 in | Wt 213.0 lb

## 2013-03-26 DIAGNOSIS — M542 Cervicalgia: Secondary | ICD-10-CM

## 2013-03-26 DIAGNOSIS — M47814 Spondylosis without myelopathy or radiculopathy, thoracic region: Secondary | ICD-10-CM | POA: Diagnosis not present

## 2013-03-26 DIAGNOSIS — M549 Dorsalgia, unspecified: Secondary | ICD-10-CM

## 2013-03-26 DIAGNOSIS — M5380 Other specified dorsopathies, site unspecified: Secondary | ICD-10-CM | POA: Diagnosis not present

## 2013-03-26 DIAGNOSIS — M62830 Muscle spasm of back: Secondary | ICD-10-CM

## 2013-03-26 MED ORDER — CYCLOBENZAPRINE HCL 10 MG PO TABS: 10.0000 mg | ORAL_TABLET | Freq: Three times a day (TID) | ORAL | Status: DC | PRN

## 2013-03-26 MED ORDER — MELOXICAM 15 MG PO TABS: 15.0000 mg | ORAL_TABLET | Freq: Every day | ORAL | Status: DC

## 2013-03-26 NOTE — Patient Instructions (Addendum)
Good to see you Meloxicam daily for 1 week.  Flexril as needed up to 3 times a day but may make you sleepy Get xrays downstairs, no news is good news.  Come back next week to make sure you are doing better  Back Pain, Adult Low back pain is very common. About 1 in 5 people have back pain.The cause of low back pain is rarely dangerous. The pain often gets better over time.About half of people with a sudden onset of back pain feel better in just 2 weeks. About 8 in 10 people feel better by 6 weeks.  CAUSES Some common causes of back pain include:  Strain of the muscles or ligaments supporting the spine.  Wear and tear (degeneration) of the spinal discs.  Arthritis.  Direct injury to the back. DIAGNOSIS Most of the time, the direct cause of low back pain is not known.However, back pain can be treated effectively even when the exact cause of the pain is unknown.Answering your caregiver's questions about your overall health and symptoms is one of the most accurate ways to make sure the cause of your pain is not dangerous. If your caregiver needs more information, he or she may order lab work or imaging tests (X-rays or MRIs).However, even if imaging tests show changes in your back, this usually does not require surgery. HOME CARE INSTRUCTIONS For many people, back pain returns.Since low back pain is rarely dangerous, it is often a condition that people can learn to [facility] their own.   Remain active. It is stressful on the back to sit or stand in one place. Do not sit, drive, or stand in one place for more than 30 minutes at a time. Take short walks on level surfaces as soon as pain allows.Try to increase the length of time you walk each day.  Do not stay in bed.Resting more than 1 or 2 days can delay your recovery.  Do not avoid exercise or work.Your body is made to move.It is not dangerous to be active, even though your back may hurt.Your back will likely heal faster if you  return to being active before your pain is gone.  Pay attention to your body when you bend and lift. Many people have less discomfortwhen lifting if they bend their knees, keep the load close to their bodies,and avoid twisting. Often, the most comfortable positions are those that put less stress on your recovering back.  Find a comfortable position to sleep. Use a firm mattress and lie on your side with your knees slightly bent. If you lie on your back, put a pillow under your knees.  Only take over-the-counter or prescription medicines as directed by your caregiver. Over-the-counter medicines to reduce pain and inflammation are often the most helpful.Your caregiver may prescribe muscle relaxant drugs.These medicines help dull your pain so you can more quickly return to your normal activities and healthy exercise.  Put ice on the injured area.  Put ice in a plastic bag.  Place a towel between your skin and the bag.  Leave the ice on for 15-20 minutes, 03-04 times a day for the first 2 to 3 days. After that, ice and heat may be alternated to reduce pain and spasms.  Ask your caregiver about trying back exercises and gentle massage. This may be of some benefit.  Avoid feeling anxious or stressed.Stress increases muscle tension and can worsen back pain.It is important to recognize when you are anxious or stressed and learn ways to manage  it.Exercise is a great option. SEEK MEDICAL CARE IF:  You have pain that is not relieved with rest or medicine.  You have pain that does not improve in 1 week.  You have new symptoms.  You are generally not feeling well. SEEK IMMEDIATE MEDICAL CARE IF:   You have pain that radiates from your back into your legs.  You develop new bowel or bladder control problems.  You have unusual weakness or numbness in your arms or legs.  You develop nausea or vomiting.  You develop abdominal pain.  You feel faint. Document Released: 02/20/2005  Document Revised: 08/22/2011 Document Reviewed: 07/11/2010 [facility] Patient Information 2014 [place], [place].

## 2013-03-26 NOTE — Progress Notes (Signed)
  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 Patient was diagnosed with a muscle spasm of the upper back and did have treatments with osteopathic manipulation. Patient states that he did have relief for approximately 2-3 hours in the midportion the pain returned. Patient states the pain continues to be in the same spot but no increasing area or increasing intensity. Patient states that it hurts with movement of the on her sometimes even his neck. Patient denies any radiation down the arm or any numbness. No new symptoms since previous exam.  Past medical, surgical, family and social history reviewed. Medications reviewed all in the electronic medical record.   Review of Systems: No headache, visual changes, nausea, vomiting, diarrhea, constipation, dizziness, abdominal pain, skin rash, fevers, chills, night sweats, weight loss, swollen lymph nodes, body aches, joint swelling, muscle aches, chest pain, shortness of breath, mood changes.   Objective:    Blood pressure 136/80, pulse 96, temperature 98.5 F (36.9 C), temperature source Oral, resp. rate 16, height 5\' 7"  (1.702 m), weight 213 lb (96.616 kg), SpO2 96.00%.   General: No apparent distress alert and oriented x3 mood and affect normal, dressed appropriately.  HEENT: Pupils equal, extraocular movements intact Respiratory: Patient's speak in full sentences and does not appear short of breath Cardiovascular: No lower extremity edema, non tender, no erythema Skin: Warm dry intact with no signs of infection or rash on extremities or on axial skeleton. Abdomen: Soft nontender Neuro: Cranial nerves II through XII are intact, neurovascularly intact in all extremities with 2+ DTRs and 2+ pulses. Lymph: No lymphadenopathy of posterior or anterior cervical chain or axillae bilaterally.  Gait normal with good balance and  coordination.  MSK: Non tender with full range of motion and good stability and symmetric strength and tone of shoulders, elbows, wrist, hip, knee and ankles bilaterally.  Back Exam:  Inspection: Mild increase in kyphosis  Motion: Flexion 45 deg, Extension 45 deg, Side Bending to 45 deg bilaterally,  Rotation to 45 deg bilaterally  SLR laying: Negative  XSLR laying: Negative  Palpable tenderness: Mild pain along the scapular medial spine. On the left side. Possibly somewhat more than last time. FABER: negative. Sensory change: Gross sensation intact to all lumbar and sacral dermatomes.  Reflexes: 2+ at both patellar tendons, 2+ at achilles tendons, Babinski's downgoing.  Strength at foot  Plantar-flexion: 5/5 Dorsi-flexion: 5/5 Eversion: 5/5 Inversion: 5/5  Leg strength  Quad: 5/5 Hamstring: 5/5 Hip flexor: 5/5 Hip abductors: 5/5  Gait unremarkable.  Neck: Inspection unremarkable. No palpable stepoffs. Negative Spurling's maneuver. Full neck range of motion Grip strength and sensation normal in bilateral hands Strength good C4 to T1 distribution No sensory change to C4 to T1 Negative Hoffman sign bilaterally Reflexes normal  Impression and Recommendations:     This case required medical decision making of moderate complexity.

## 2013-03-26 NOTE — Assessment & Plan Note (Signed)
Discussed with patient at length. Patient would like x-rays which I will get of the cervical and thoracic spine. In addition her that patient was given muscle relaxers as well as anti-inflammatories. Home exercise program to start in 48 hours. Patient will do no lifting at work for the next week. Patient will follow up in one week with me to make sure that he continues to improve.

## 2013-03-26 NOTE — Progress Notes (Signed)
Pre visit review using our clinic review tool, if applicable. No additional management support is needed unless otherwise documented below in the visit note. 

## 2013-03-27 ENCOUNTER — Telehealth (HOSPITAL_COMMUNITY): Payer: Self-pay | Admitting: *Deleted

## 2013-03-27 NOTE — Telephone Encounter (Signed)
Pt left EE:[facility] like to have prescription for Loarazepam,1mg ,#60.States he does not feel well and knows this will help him.States MD has ordered this in past for him

## 2013-03-28 ENCOUNTER — Other Ambulatory Visit (HOSPITAL_COMMUNITY): Payer: Self-pay | Admitting: Psychiatry

## 2013-03-28 MED ORDER — LORAZEPAM 0.5 MG PO TABS: 0.5000 mg | ORAL_TABLET | ORAL | Status: DC | PRN

## 2013-03-28 NOTE — Progress Notes (Signed)
Ativan 0.5 mg called in for severe anxiety.

## 2013-03-31 ENCOUNTER — Ambulatory Visit (HOSPITAL_COMMUNITY): Payer: Self-pay | Admitting: Psychiatry

## 2013-04-02 ENCOUNTER — Ambulatory Visit: Payer: Self-pay | Admitting: Family Medicine

## 2013-04-05 ENCOUNTER — Encounter: Payer: Self-pay | Admitting: Internal Medicine

## 2013-04-05 ENCOUNTER — Ambulatory Visit (INDEPENDENT_AMBULATORY_CARE_PROVIDER_SITE_OTHER): Payer: Medicare Other | Admitting: Internal Medicine

## 2013-04-05 VITALS — BP 120/74 | HR 94 | Temp 98.9°F | Resp 20 | Ht 67.0 in | Wt 213.0 lb

## 2013-04-05 DIAGNOSIS — K1370 Unspecified lesions of oral mucosa: Secondary | ICD-10-CM | POA: Diagnosis not present

## 2013-04-05 DIAGNOSIS — K1379 Other lesions of oral mucosa: Secondary | ICD-10-CM

## 2013-04-05 MED ORDER — CHLORHEXIDINE GLUCONATE 0.12 % MT SOLN: 15.0000 mL | Freq: Two times a day (BID) | OROMUCOSAL | Status: DC

## 2013-04-05 NOTE — Progress Notes (Signed)
Pre-visit discussion using our clinic review tool. No additional management support is needed unless otherwise documented below in the visit note.  

## 2013-04-05 NOTE — Progress Notes (Signed)
   Subjective:    Patient ID: [place], male    DOB: 07/31/1980, 33 y.o.   MRN: [id number]  HPI   He has had a "blister" approximately one month over the maxillary gum tissue in the right. It is tender to touch and may be increasing in size.  With pressure there has been the production of scant blood and possibly pus. He has no associated upper respiratory tract symptoms.  He saw his dentist in September of 2014; he had no active issues.    Review of Systems He does specifically deny fever, chills, sweats, weight loss.  He has no frontal headache, facial pain, nasal purulence, dental pain, otic pain, otic discharge.       Objective:    Physical Exam General appearance:good health ;well nourished; no acute distress or increased work of breathing is present.  No  lymphadenopathy about the head, neck, or axilla noted.   Eyes: No conjunctival inflammation or lid edema is present.   Ears:  External ear exam shows no significant lesions or deformities.  Otoscopic examination reveals wax bilaterally.  Nose:  External nasal examination shows no deformity or inflammation. Nasal mucosa are dry without lesions or exudates. No septal dislocation or deviation.No obstruction to airflow.   Oral exam: Dental hygiene is good; lips and gums are healthy appearing.There is no oropharyngeal erythema or exudate noted. There is the suggestion of a mucocele 0.3 x 0.4 cm over the lateral maxillary gum. Pressure does result in some serous discharge without frank purulence.  Neck:  No deformities, masses, or tenderness noted.     Skin: Warm & dry          Assessment & Plan:  #1 mucocele with possible secondary infection  See orders

## 2013-04-05 NOTE — Patient Instructions (Signed)
I recommend a Dental consultation to determine optimal therapy; Peridontal referral may be necessary.

## 2013-04-28 ENCOUNTER — Ambulatory Visit (HOSPITAL_COMMUNITY): Payer: Self-pay | Admitting: Psychiatry

## 2013-04-28 ENCOUNTER — Other Ambulatory Visit (HOSPITAL_COMMUNITY): Payer: Self-pay | Admitting: Psychiatry

## 2013-04-28 ENCOUNTER — Telehealth (HOSPITAL_COMMUNITY): Payer: Self-pay | Admitting: *Deleted

## 2013-04-28 ENCOUNTER — Telehealth (HOSPITAL_COMMUNITY): Payer: Self-pay | Admitting: Psychiatry

## 2013-04-28 DIAGNOSIS — F429 Obsessive-compulsive disorder, unspecified: Secondary | ICD-10-CM

## 2013-04-28 MED ORDER — MIRTAZAPINE 15 MG PO TABS: 15.0000 mg | ORAL_TABLET | Freq: Every day | ORAL | Status: DC

## 2013-04-28 NOTE — Telephone Encounter (Signed)
Pt left [id number] on Remeron 30 mg.Would rather take Remeron 15 mg.It works fine.He used to be on it and would like to take instead of 30 mg.

## 2013-04-28 NOTE — Telephone Encounter (Signed)
Patient called and like to drive Remeron 15 mg.  He is already taking 15 mg for 2 weeks and feeling better.  In the past he had tried 15 mg but he requested to increase the dose because he was not doing very well.  I explained that he may require a 30 mg but he insists that he wants to try 15 mg.  We will call the pharmacy with new dosage of 15 mg daily

## 2013-05-13 ENCOUNTER — Ambulatory Visit (HOSPITAL_COMMUNITY): Payer: Self-pay | Admitting: Psychiatry

## 2013-05-25 ENCOUNTER — Other Ambulatory Visit: Payer: Self-pay | Admitting: Internal Medicine

## 2013-05-28 ENCOUNTER — Encounter: Payer: Self-pay | Admitting: Family Medicine

## 2013-05-28 ENCOUNTER — Ambulatory Visit (INDEPENDENT_AMBULATORY_CARE_PROVIDER_SITE_OTHER): Payer: Medicare Other | Admitting: Family Medicine

## 2013-05-28 ENCOUNTER — Telehealth: Payer: Self-pay | Admitting: *Deleted

## 2013-05-28 VITALS — BP 130/78 | HR 68

## 2013-05-28 DIAGNOSIS — M999 Biomechanical lesion, unspecified: Secondary | ICD-10-CM | POA: Diagnosis not present

## 2013-05-28 DIAGNOSIS — M546 Pain in thoracic spine: Secondary | ICD-10-CM | POA: Diagnosis not present

## 2013-05-28 NOTE — Telephone Encounter (Signed)
I would like to see patient first before I would get the test. It has been 2 months since last visit.

## 2013-05-28 NOTE — Progress Notes (Signed)
  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 Patient states that the pain is mostly in the neck in the upper thoracic spine. Patient has had x-rays previously that showed no significant bony abnormality. Patient does have some degenerative changes which is advanced for his age of the thoracic spine but mild in nature. Patient states the pain is increased considerably. Patient states now it seems to be waking him up at night. Patient denies any radiation down the legs but states that he has trouble concentrating to the day and has found it difficult to care for his grandmother secondary to the pain. Patient has tried over-the-counter medications without any significant improvement. Patient has tried meloxicam and Flexeril with no help. Patient is losing sleep and feels that the pain is getting worse day by day.  Past medical, surgical, family and social history reviewed. Medications reviewed all in the electronic medical record.   Review of Systems: No headache, visual changes, nausea, vomiting, diarrhea, constipation, dizziness, abdominal pain, skin rash, fevers, chills, night sweats, weight loss, swollen lymph nodes, body aches, joint swelling, muscle aches, chest pain, shortness of breath, mood changes.   Objective:    Blood pressure 130/78, pulse 68, SpO2 96.00%.   General: No apparent distress alert and oriented x3 mood and affect normal, dressed appropriately.  HEENT: Pupils equal, extraocular movements intact Respiratory: Patient's speak in full sentences and does not appear short of breath Cardiovascular: No lower extremity edema, non tender, no erythema Skin: Warm dry intact with no signs of infection or rash on extremities or on axial skeleton. Abdomen: Soft nontender Neuro: Cranial nerves II through XII are intact, neurovascularly intact in all extremities  with 2+ DTRs and 2+ pulses. Lymph: No lymphadenopathy of posterior or anterior cervical chain or axillae bilaterally.  Gait normal with good balance and coordination.  MSK: Non tender with full range of motion and good stability and symmetric strength and tone of shoulders, elbows, wrist, hip, knee and ankles bilaterally.    Neck: Inspection unremarkable. Mild increase of kyphosis at the T1. No palpable stepoffs. Negative Spurling's maneuver for radiculopathy but does cause significant amount of pain to patient. Full neck range of motion Grip strength and sensation normal in bilateral hands Strength good C4 to T1 distribution No sensory change to C4 to T1 Negative Hoffman sign bilaterally Reflexes normal Back Exam:  Inspection: Unremarkable  Motion: Flexion 45 deg, Extension 45 deg, Side Bending to 45 deg bilaterally,  Rotation to 45 deg bilaterally  SLR laying: Negative  XSLR laying: Negative  Palpable tenderness: Patient is very tender on the midline of the thoracic spine mostly over the T1-2 area FABER: negative. Sensory change: Gross sensation intact to all lumbar and sacral dermatomes.  Reflexes: 2+ at both patellar tendons, 2+ at achilles tendons, Babinski's downgoing.  Strength at foot  Plantar-flexion: 5/5 Dorsi-flexion: 5/5 Eversion: 5/5 Inversion: 5/5  Leg strength  Quad: 5/5 Hamstring: 5/5 Hip flexor: 5/5 Hip abductors: 5/5  Gait unremarkable.   Impression and Recommendations:     This case required medical decision making of moderate complexity.

## 2013-05-28 NOTE — Telephone Encounter (Signed)
Pt called requesting an MRI for neck and upper back due to continued pain.  Please advise

## 2013-05-28 NOTE — Telephone Encounter (Signed)
Spoke with pt advised of MDs message.  Appt scheduled

## 2013-05-28 NOTE — Patient Instructions (Signed)
Very good to see you We will get MRI of thoracic and cervical neck pain Continue the exercises  When you know when the MRI is please come back 1-2 days after it and we will go over it.

## 2013-05-28 NOTE — Assessment & Plan Note (Addendum)
Patient has worsening of thoracic back pain. Patient is having some midline tenderness but x-rays previously did show degenerative changes there were advanced for his age. I do feel that further imaging could be necessary and an MRI will be ordered today. We'll also get an MRI of the cervical spine increases his more radiculopathy. No extremities affected today. Patient also has no fevers or chills or any abnormal weight loss. Patient will have this and come back one to 2 days after the scan and we'll discuss images. Depending on the pathology found Bruce Ellison the discussed restarting osteopathic manipulation versus further intervention.  Spent greater than 25 minutes with patient face-to-face and had greater than 50% of counseling including as described above in assessment and plan.

## 2013-06-02 ENCOUNTER — Ambulatory Visit (HOSPITAL_COMMUNITY): Payer: Self-pay | Admitting: Psychiatry

## 2013-06-03 ENCOUNTER — Encounter (HOSPITAL_COMMUNITY): Payer: Self-pay | Admitting: Psychiatry

## 2013-06-03 ENCOUNTER — Ambulatory Visit (INDEPENDENT_AMBULATORY_CARE_PROVIDER_SITE_OTHER): Payer: Medicare Other | Admitting: Psychiatry

## 2013-06-03 VITALS — BP 117/67 | HR 93 | Ht 67.0 in

## 2013-06-03 DIAGNOSIS — F429 Obsessive-compulsive disorder, unspecified: Secondary | ICD-10-CM

## 2013-06-03 DIAGNOSIS — F959 Tic disorder, unspecified: Secondary | ICD-10-CM

## 2013-06-03 DIAGNOSIS — F952 Tourette's disorder: Secondary | ICD-10-CM | POA: Diagnosis not present

## 2013-06-03 DIAGNOSIS — F411 Generalized anxiety disorder: Secondary | ICD-10-CM | POA: Diagnosis not present

## 2013-06-03 MED ORDER — VENLAFAXINE HCL ER 75 MG PO CP24: 75.0000 mg | ORAL_CAPSULE | Freq: Every day | ORAL | Status: DC

## 2013-06-03 MED ORDER — ARIPIPRAZOLE 5 MG PO TABS: 5.0000 mg | ORAL_TABLET | Freq: Every day | ORAL | Status: DC

## 2013-06-03 MED ORDER — MIRTAZAPINE 15 MG PO TABS: 15.0000 mg | ORAL_TABLET | Freq: Every day | ORAL | Status: DC
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
## 2013-06-04 ENCOUNTER — Ambulatory Visit
Admission: RE | Admit: 2013-06-04 | Discharge: 2013-06-04 | Disposition: A | Discharge status: Home / Self Care | Payer: Medicare Other | Source: Ambulatory Visit | Attending: Family Medicine | Admitting: Family Medicine

## 2013-06-04 DIAGNOSIS — M999 Biomechanical lesion, unspecified: Secondary | ICD-10-CM

## 2013-06-04 DIAGNOSIS — M4802 Spinal stenosis, cervical region: Secondary | ICD-10-CM | POA: Diagnosis not present

## 2013-06-04 DIAGNOSIS — M5124 Other intervertebral disc displacement, thoracic region: Secondary | ICD-10-CM | POA: Diagnosis not present

## 2013-06-04 DIAGNOSIS — M5020 Other cervical disc displacement, unspecified cervical region: Secondary | ICD-10-CM | POA: Diagnosis not present

## 2013-06-11 ENCOUNTER — Encounter: Payer: Self-pay | Admitting: Family Medicine

## 2013-06-11 ENCOUNTER — Telehealth: Payer: Self-pay | Admitting: Family Medicine

## 2013-06-11 ENCOUNTER — Ambulatory Visit (INDEPENDENT_AMBULATORY_CARE_PROVIDER_SITE_OTHER): Payer: Medicare Other | Admitting: Family Medicine

## 2013-06-11 VITALS — BP 126/78 | HR 81

## 2013-06-11 DIAGNOSIS — M5010 Cervical disc disorder with radiculopathy, unspecified cervical region: Secondary | ICD-10-CM

## 2013-06-11 DIAGNOSIS — M5412 Radiculopathy, cervical region: Secondary | ICD-10-CM

## 2013-06-11 NOTE — Assessment & Plan Note (Signed)
Patient continues to unfortunately have pain and cannot tolerated he states on a daily basis. Patient is having some radicular symptoms and does have a positive Spurling's maneuver. I do think would be a good idea to try a epidural steroid injection. Patient will be scheduled for this and come back 2 weeks after injection to see how he is doing. If we can get the pain under control we can consider osteopathic manipulation again , or if this does not seem to help we may need to repeat injections or consider neurologically Consult we'll discuss with patient at greater length at followup

## 2013-06-11 NOTE — Progress Notes (Signed)
  CC: Back and neck pain followup  HPI: Patient is a 33 year old gentleman who is coming back for MRI results of his back pain. Patient states that he is doing about the same. Patient is having upper thoracic pain and neck pain with some mild radiculopathy into the right arm.  Patient had MRI of the cervical and thoracic spine done. Patient does have a broad based disc osteophyte complex with spurring worse on the right that is giving mild right foraminal narrowing that could be consistent with patient's pain.  Patient states it has not stopped him from any of his daily activities but continues to be a dull aching sensation.  MRI results were reviewed with him today. Patient's MRI shows  Past medical, surgical, family and social history reviewed. Medications reviewed all in the electronic medical record.   Review of Systems: No headache, visual changes, nausea, vomiting, diarrhea, constipation, dizziness, abdominal pain, skin rash, fevers, chills, night sweats, weight loss, swollen lymph nodes, body aches, joint swelling, muscle aches, chest pain, shortness of breath, mood changes.   Objective:    Blood pressure 126/78, pulse 81, SpO2 98.00%.   General: No apparent distress alert and oriented x3 mood and affect normal, dressed appropriately.  HEENT: Pupils equal, extraocular movements intact Respiratory: Patient's speak in full sentences and does not appear short of breath Cardiovascular: No lower extremity edema, non tender, no erythema Skin: Warm dry intact with no signs of infection or rash on extremities or on axial skeleton. Abdomen: Soft nontender Neuro: Cranial nerves II through XII are intact, neurovascularly intact in all extremities with 2+ DTRs and 2+ pulses. Lymph: No lymphadenopathy of posterior or anterior cervical chain or axillae bilaterally.  Gait normal with good balance and coordination.  MSK: Non tender with full range of motion and good stability and symmetric strength  and tone of shoulders, elbows, wrist, hip, knee and ankles bilaterally.    Neck: Inspection unremarkable. Mild increase of kyphosis at the T1. No palpable stepoffs. Positive Spurling's maneuver for radiculopathy and does cause significant amount of pain to patient. Full neck range of motion Grip strength and sensation normal in bilateral hands Strength good C4 to T1 distribution No sensory change to C4 to T1 Negative Hoffman sign bilaterally Reflexes normal Back Exam:  Inspection: Unremarkable  Motion: Flexion 45 deg, Extension 45 deg, Side Bending to 45 deg bilaterally,  Rotation to 45 deg bilaterally  SLR laying: Negative  XSLR laying: Negative  Palpable tenderness: Patient is very tender on the midline of the thoracic spine mostly over the T1-2 area FABER: negative. Sensory change: Gross sensation intact to all lumbar and sacral dermatomes.  Reflexes: 2+ at both patellar tendons, 2+ at achilles tendons, Babinski's downgoing.  Strength at foot  Plantar-flexion: 5/5 Dorsi-flexion: 5/5 Eversion: 5/5 Inversion: 5/5  Leg strength  Quad: 5/5 Hamstring: 5/5 Hip flexor: 5/5 Hip abductors: 5/5  Gait unremarkable.   Impression and Recommendations:     This case required medical decision making of moderate complexity.

## 2013-06-11 NOTE — Telephone Encounter (Signed)
Patient is asking for a referral to see a Dr. Evelina Bucy at [facility] instead of going to [facility] for his back injection.

## 2013-06-11 NOTE — Patient Instructions (Signed)
Good to see you They will call you to set up the injection Once you know when this is come back 2 weeks afterward.  In the mean time continue to do some of the back and neck exercises at least 3 times a week.

## 2013-06-12 NOTE — Telephone Encounter (Signed)
Referral entered  

## 2013-06-20 ENCOUNTER — Telehealth: Payer: Self-pay | Admitting: Family Medicine

## 2013-06-20 NOTE — Telephone Encounter (Signed)
Pt called to say he does not want a referral at this time to a spine specialist.

## 2013-06-27 ENCOUNTER — Encounter: Payer: Self-pay | Admitting: Internal Medicine

## 2013-06-27 ENCOUNTER — Ambulatory Visit (INDEPENDENT_AMBULATORY_CARE_PROVIDER_SITE_OTHER): Payer: Medicare Other | Admitting: Internal Medicine

## 2013-06-27 VITALS — BP 118/72 | HR 93 | Temp 98.1°F | Wt 218.8 lb

## 2013-06-27 DIAGNOSIS — R42 Dizziness and giddiness: Secondary | ICD-10-CM | POA: Diagnosis not present

## 2013-06-27 DIAGNOSIS — S098XXA Other specified injuries of head, initial encounter: Secondary | ICD-10-CM

## 2013-06-27 DIAGNOSIS — S0990XA Unspecified injury of head, initial encounter: Secondary | ICD-10-CM

## 2013-06-27 DIAGNOSIS — R51 Headache: Secondary | ICD-10-CM

## 2013-06-27 MED ORDER — MECLIZINE HCL 25 MG PO TABS: ORAL_TABLET | ORAL | Status: DC

## 2013-06-27 NOTE — Progress Notes (Signed)
Pre visit review using our clinic review tool, if applicable. No additional management support is needed unless otherwise documented below in the visit note. 

## 2013-06-27 NOTE — Patient Instructions (Signed)
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
## 2013-07-07 DIAGNOSIS — H40059 Ocular hypertension, unspecified eye: Secondary | ICD-10-CM | POA: Diagnosis not present

## 2013-07-31 DIAGNOSIS — M542 Cervicalgia: Secondary | ICD-10-CM | POA: Diagnosis not present

## 2013-07-31 DIAGNOSIS — M546 Pain in thoracic spine: Secondary | ICD-10-CM | POA: Diagnosis not present

## 2013-07-31 DIAGNOSIS — M545 Low back pain, unspecified: Secondary | ICD-10-CM | POA: Diagnosis not present

## 2013-08-06 DIAGNOSIS — M542 Cervicalgia: Secondary | ICD-10-CM | POA: Diagnosis not present

## 2013-08-11 DIAGNOSIS — M542 Cervicalgia: Secondary | ICD-10-CM | POA: Diagnosis not present

## 2013-08-13 DIAGNOSIS — M542 Cervicalgia: Secondary | ICD-10-CM | POA: Diagnosis not present

## 2013-08-14 DIAGNOSIS — M542 Cervicalgia: Secondary | ICD-10-CM | POA: Diagnosis not present

## 2013-08-19 DIAGNOSIS — M542 Cervicalgia: Secondary | ICD-10-CM | POA: Diagnosis not present

## 2013-08-20 DIAGNOSIS — M542 Cervicalgia: Secondary | ICD-10-CM | POA: Diagnosis not present

## 2013-08-21 DIAGNOSIS — M47812 Spondylosis without myelopathy or radiculopathy, cervical region: Secondary | ICD-10-CM | POA: Diagnosis not present

## 2013-08-21 DIAGNOSIS — M542 Cervicalgia: Secondary | ICD-10-CM | POA: Diagnosis not present

## 2013-08-21 DIAGNOSIS — M5030 Other cervical disc degeneration, unspecified cervical region: Secondary | ICD-10-CM | POA: Diagnosis not present

## 2013-08-21 DIAGNOSIS — M546 Pain in thoracic spine: Secondary | ICD-10-CM | POA: Diagnosis not present

## 2013-08-26 ENCOUNTER — Other Ambulatory Visit (HOSPITAL_COMMUNITY): Payer: Self-pay | Admitting: Psychiatry

## 2013-08-27 ENCOUNTER — Other Ambulatory Visit (HOSPITAL_COMMUNITY): Payer: Self-pay | Admitting: Psychiatry

## 2013-08-27 DIAGNOSIS — M542 Cervicalgia: Secondary | ICD-10-CM | POA: Diagnosis not present

## 2013-08-28 ENCOUNTER — Telehealth (HOSPITAL_COMMUNITY): Payer: Self-pay

## 2013-08-28 ENCOUNTER — Other Ambulatory Visit (HOSPITAL_COMMUNITY): Payer: Self-pay | Admitting: Psychiatry

## 2013-08-28 NOTE — Telephone Encounter (Signed)
I returned patient's phone call.  He is complaining of shoulder pain and wondering if any of his psychotropic medication causing pain.  We do not make any changes in his medication.  He is taking Abilify, Effexor and Remeron for a long time.  It is unlikely that psychotropic medication causing shoulder pain however I strongly encouraged him to see his primary care doctor if pain persists.

## 2013-08-29 ENCOUNTER — Ambulatory Visit (INDEPENDENT_AMBULATORY_CARE_PROVIDER_SITE_OTHER): Payer: Medicare Other | Admitting: Internal Medicine

## 2013-08-29 ENCOUNTER — Encounter: Payer: Self-pay | Admitting: Internal Medicine

## 2013-08-29 VITALS — BP 130/84 | HR 80 | Temp 98.1°F | Resp 16 | Ht 65.0 in | Wt 222.1 lb

## 2013-08-29 DIAGNOSIS — M5010 Cervical disc disorder with radiculopathy, unspecified cervical region: Secondary | ICD-10-CM

## 2013-08-29 DIAGNOSIS — M5412 Radiculopathy, cervical region: Secondary | ICD-10-CM

## 2013-08-29 MED ORDER — HYDROCODONE-ACETAMINOPHEN 5-325 MG PO TABS: 1.0000 | ORAL_TABLET | Freq: Four times a day (QID) | ORAL | Status: DC | PRN

## 2013-08-29 NOTE — Assessment & Plan Note (Signed)
Recent MRI showed spinal stenosis - will refer to pain management to see if ESI will help For now, will help control the pain with norco Will cont nsaids and muscle relaxers

## 2013-08-29 NOTE — Progress Notes (Signed)
Pre visit review using our clinic review tool, if applicable. No additional management support is needed unless otherwise documented below in the visit note. 

## 2013-08-29 NOTE — Progress Notes (Signed)
Subjective:    Patient ID: Bruce Ellison, male    DOB: May 01, 1980, 33 y.o.   MRN: [id number]  Neck Pain  This is a recurrent problem. The current episode started more than 1 month ago. The problem occurs intermittently. The problem has been gradually worsening. The pain is associated with nothing. The pain is present in the right side. The quality of the pain is described as stabbing and shooting. The pain is at a severity of 5/10. The pain is moderate. The symptoms are aggravated by position. The pain is worse during the night. Associated symptoms include numbness (right hand). Pertinent negatives include no chest pain, fever, headaches, leg pain, pain with swallowing, paresis, photophobia, syncope, tingling, trouble swallowing, visual change, weakness or weight loss. He has tried NSAIDs and muscle relaxants for the symptoms. The treatment provided mild relief.      Review of Systems  Constitutional: Negative.  Negative for fever and weight loss.  HENT: Negative.  Negative for trouble swallowing.   Eyes: Negative.  Negative for photophobia.  Respiratory: Negative.   Cardiovascular: Negative.  Negative for chest pain, palpitations, leg swelling and syncope.  Gastrointestinal: Negative.  Negative for abdominal pain.  Endocrine: Negative.   Genitourinary: Negative.   Musculoskeletal: Positive for myalgias (diffuse pain in RUE) and neck pain. Negative for arthralgias, back pain, gait problem, joint swelling and neck stiffness.  Skin: Negative.  Negative for rash.  Allergic/Immunologic: Negative.   Neurological: Positive for numbness (right hand). Negative for tingling, weakness and headaches.  Hematological: Negative.   Psychiatric/Behavioral: Negative.        Objective:   Physical Exam  Vitals reviewed. Constitutional: He is oriented to person, place, and time. He appears well-developed and well-nourished. No distress.  HENT:  Head: Normocephalic and atraumatic.  Mouth/Throat:  Oropharynx is clear and moist. No oropharyngeal exudate.  Eyes: Conjunctivae are normal. Right eye exhibits no discharge. Left eye exhibits no discharge. No scleral icterus.  Neck: Normal range of motion. Neck supple. No JVD present. No tracheal deviation present. No thyromegaly present.  Cardiovascular: Normal rate, regular rhythm, normal heart sounds and intact distal pulses.  Exam reveals no gallop and no friction rub.   No murmur heard. Pulmonary/Chest: Effort normal and breath sounds normal. No stridor. No respiratory distress. He has no wheezes. He has no rales. He exhibits no tenderness.  Abdominal: Soft. Bowel sounds are normal. He exhibits no distension and no mass. There is no tenderness. There is no rebound and no guarding.  Musculoskeletal: Normal range of motion. He exhibits no edema and no tenderness.       Cervical back: Normal. He exhibits normal range of motion, no tenderness, no bony tenderness, no swelling, no edema, no deformity, no laceration, no pain, no spasm and normal pulse.  Lymphadenopathy:    He has no cervical adenopathy.  Neurological: He is alert and oriented to person, place, and time. He has normal strength. He displays no atrophy, no tremor and normal reflexes. No cranial nerve deficit or sensory deficit. He exhibits normal muscle tone. He displays a negative Romberg sign. He displays no seizure activity. Coordination and gait normal.  Reflex Scores:      Tricep reflexes are 1+ on the right side and 1+ on the left side.      Bicep reflexes are 1+ on the right side and 1+ on the left side.      Brachioradialis reflexes are 2+ on the right side and 2+ on the left side.  Patellar reflexes are 1+ on the right side and 1+ on the left side.      Achilles reflexes are 1+ on the right side and 1+ on the left side. Skin: Skin is warm and dry. No rash noted. He is not diaphoretic. No erythema. No pallor.  Psychiatric: He has a normal mood and affect. His behavior is  normal. Judgment and thought content normal.     Lab Results  Component Value Date   WBC 4.8 12/06/2012   HGB 16.4 12/06/2012   HCT 47.9 12/06/2012   PLT 265.0 12/06/2012   GLUCOSE 106* 12/06/2012   CHOL 203* 08/30/2010   TRIG 81.0 08/30/2010   HDL 46.80 08/30/2010   LDLDIRECT 136.9 08/30/2010   LDLCALC 130* 05/02/2006   ALT 35 12/06/2012   AST 26 12/06/2012   NA 139 12/06/2012   K 4.2 12/06/2012   CL 107 12/06/2012   CREATININE 0.9 12/06/2012   BUN 11 12/06/2012   CO2 26 12/06/2012   TSH 0.91 12/06/2012   HGBA1C 5.3 06/04/2012       Assessment & Plan:

## 2013-08-29 NOTE — Patient Instructions (Signed)
Spinal Stenosis Spinal stenosis is an abnormal narrowing of the canals of your spine (vertebrae). CAUSES  Spinal stenosis is caused by areas of bone pushing into the central canals of your vertebrae. This condition can be present at birth (congenital). It also may be caused by arthritic deterioration of your vertebrae (spinal degeneration).  SYMPTOMS   Pain that is generally worse with activities, particularly standing and walking.  Numbness, tingling, hot or cold sensations, weakness, or weariness in your legs.  Frequent episodes of falling.  A foot-slapping gait that leads to muscle weakness. DIAGNOSIS  Spinal stenosis is diagnosed with the use of magnetic resonance imaging (MRI) or computed tomography (CT). TREATMENT  Initial therapy for spinal stenosis focuses on the management of the pain and other symptoms associated with the condition. These therapies include:  Practicing postural changes to lessen pressure on your nerves.  Exercises to strengthen the core of your body.  Loss of excess body weight.  The use of nonsteroidal anti-inflammatory medications to reduce swelling and inflammation in your nerves. When therapies to manage pain are not successful, surgery to treat spinal stenosis may be recommended. This surgery involves removing excess bone, which puts pressure on your nerve roots. During this surgery (laminectomy), the posterior boney arch (lamina) and excess bone around the facet joints are removed. Document Released: 05/13/2003 Document Revised: 06/17/2012 Document Reviewed: 05/31/2012 Exit[employer] Patient Information 2015 [employer]. This information is not intended to replace advice given to you by your health care provider. Make sure you discuss any questions you have with your health care provider.  

## 2013-09-02 ENCOUNTER — Ambulatory Visit (HOSPITAL_COMMUNITY): Payer: Self-pay | Admitting: Psychiatry

## 2013-09-02 DIAGNOSIS — M5412 Radiculopathy, cervical region: Secondary | ICD-10-CM | POA: Diagnosis not present

## 2013-09-02 DIAGNOSIS — M5030 Other cervical disc degeneration, unspecified cervical region: Secondary | ICD-10-CM | POA: Diagnosis not present

## 2013-09-02 DIAGNOSIS — M546 Pain in thoracic spine: Secondary | ICD-10-CM | POA: Diagnosis not present

## 2013-09-02 DIAGNOSIS — E663 Overweight: Secondary | ICD-10-CM | POA: Diagnosis not present

## 2013-09-02 DIAGNOSIS — M47812 Spondylosis without myelopathy or radiculopathy, cervical region: Secondary | ICD-10-CM | POA: Diagnosis not present

## 2013-09-09 ENCOUNTER — Encounter (HOSPITAL_COMMUNITY): Payer: Self-pay | Admitting: Psychiatry

## 2013-09-09 ENCOUNTER — Ambulatory Visit (INDEPENDENT_AMBULATORY_CARE_PROVIDER_SITE_OTHER): Payer: Medicare Other | Admitting: Psychiatry

## 2013-09-09 VITALS — BP 127/77 | HR 76 | Ht 67.0 in | Wt 223.0 lb

## 2013-09-09 DIAGNOSIS — F952 Tourette's disorder: Secondary | ICD-10-CM | POA: Diagnosis not present

## 2013-09-09 DIAGNOSIS — F959 Tic disorder, unspecified: Secondary | ICD-10-CM | POA: Diagnosis not present

## 2013-09-09 DIAGNOSIS — F429 Obsessive-compulsive disorder, unspecified: Secondary | ICD-10-CM

## 2013-09-09 DIAGNOSIS — F411 Generalized anxiety disorder: Secondary | ICD-10-CM | POA: Diagnosis not present

## 2013-09-09 MED ORDER — VENLAFAXINE HCL ER 75 MG PO CP24: 75.0000 mg | ORAL_CAPSULE | Freq: Every day | ORAL | Status: DC

## 2013-09-09 MED ORDER — DIAZEPAM 5 MG PO TABS: 5.0000 mg | ORAL_TABLET | Freq: Every day | ORAL | Status: DC

## 2013-09-09 MED ORDER — ARIPIPRAZOLE 5 MG PO TABS: ORAL_TABLET | ORAL | Status: DC

## 2013-09-09 MED ORDER — MIRTAZAPINE 15 MG PO TABS
15.0000 mg | ORAL_TABLET | Freq: Every day | ORAL | Status: DC
Start: 2013-09-09 — End: 2013-12-10

## 2013-09-09 NOTE — Progress Notes (Signed)
Bruce Ellison  Bruce Ellison [id number] 33 y.o.  09/09/2013 4:08 PM  Chief Complaint: Medication management and followup.              History of Present Illness:  Bruce Ellison came for his appointment.  He was recently diagnosed with spinal stenosis.  He saw his physician and he was tried multiple pain medication but he cannot take it because of the side effects.  Last week he was given injection and he is feeling somewhat better.  He admitted poor sleep because of that pain but overall his mood has been better.  He is compliant with Effexor, Remeron and Abilify.  He denies any shakes or tremors.  His appetite is okay.  He denies any agitation, anger or any mood swings.  He denies any major panic attack.  He wants to try benzodiazepine at that time to help with sleep anxiety because he admitted racing thoughts sometime in the night and cannot sleep.  In the past he had taken Valium, Ativan, Xanax and Valium.  Patient does not drink or use any illegal substances.  He denies any hallucination or any paranoia.  His OCD symptoms are well controlled.  He denies any side effects of medication.  Patient lives with his mother and grandparents.  Suicidal Ideation: No Plan Formed: No Patient has means to carry out plan: No  Homicidal Ideation: No Plan Formed: No Patient has means to carry out plan: No  Review of Systems: Psychiatric: Agitation: No Hallucination: No Depressed Mood: No Insomnia: Yes Hypersomnia: No Altered Concentration: No Feels Worthless: No Grandiose Ideas: No Belief In Special Powers: No New/Increased Substance Abuse: No Compulsions: Counting numbers   ROS   Past Medical History  Diagnosis Date  . Anxiety   . OCD (obsessive compulsive disorder)   . Personality disorder     with schizoid features  . Tourette's     per [facility]  . Insomnia   . HTN (hypertension)     borderline  . Obesity   . Allergic rhinitis   . Glucose intolerance  (impaired glucose tolerance)   . GERD (gastroesophageal reflux disease)   . Vitamin D deficiency   . Depression   . Sleep apnea    His primary care physician is Dr. Christianne Borrow.   Social history.   Patient was born in [place], [place].  At age 17 his parents got divorced.  He moved the [place] at age 52.  He is living with his mother and grandparents.  Patient father still lives in [place].  Patient has no contact with his father.  Patient has never married.  He has no children.  He has limited social network.  Outpatient Encounter Prescriptions as of 09/09/2013  Medication Sig  . ARIPiprazole (ABILIFY) 5 MG tablet TAKE 1 TABLET BY MOUTH AT BEDTIME  . chlorhexidine (PERIDEX) 0.12 % solution Use as directed 15 mLs in the mouth or throat 2 (two) times daily.  [name] desonide (DESOWEN) 0.05 % cream   . mirtazapine (REMERON) 15 MG tablet Take 1 tablet (15 mg total) by mouth at bedtime.  . Multiple Vitamin (MULTIVITAMIN WITH MINERALS) TABS Take 1 tablet by mouth every morning.  . venlafaxine XR (EFFEXOR-XR) 75 MG 24 hr capsule Take 1 capsule (75 mg total) by mouth daily.  . [DISCONTINUED] ARIPiprazole (ABILIFY) 5 MG tablet TAKE 1 TABLET BY MOUTH AT BEDTIME  . [DISCONTINUED] mirtazapine (REMERON) 15 MG tablet Take 1 tablet (15 mg total) by mouth at  bedtime.  . [DISCONTINUED] venlafaxine XR (EFFEXOR-XR) 75 MG 24 hr capsule Take 1 capsule (75 mg total) by mouth daily.  . diazepam (VALIUM) 5 MG tablet Take 1 tablet (5 mg total) by mouth at bedtime.  [name] HYDROcodone-acetaminophen (NORCO/VICODIN) 5-325 MG per tablet Take 1 tablet by mouth every 6 (six) hours as needed for moderate pain.  [name] PREVIDENT 5000 DRY MOUTH 1.1 % GEL dental gel     Past Psychiatric History/Hospitalization(s): Anxiety: Patient has history of psychiatric illness at age 43.  At that time he was in [place].  He has Tic, anxiety and anger. Bipolar Disorder: No Depression: Yes, he had tried Zoloft, Prozac and recently Luvox. Mania:  No Psychosis: Patient has history of paranoia.  In the past he had tried Geodon, Risperdal and Seroquel.  He was seeing psychiatrist at [facility].  In the past he was also to get treatment in [place].  He was seeing psychiatrist in [place] in [place].  He&amp;#39;s been taking the medication since age 81.  Recently his been seeing Dr. Toy Care who had prescribed her Zoloft, Valium, Klonopin, Xanax, Prozac and Ambien.  We also tried luvox however he developed more irritability. Schizophrenia: No Personality Disorder: No Hospitalization for psychiatric illness: No History of Electroconvulsive Shock Therapy: No Prior Suicide Attempts: No  Physical Exam: Constitutional:  BP 127/77  Pulse 76  Ht 5\' 7"  (1.702 m)  Wt 223 lb (101.152 kg)  BMI 34.92 kg/m2  No results found for this or any previous visit (from the past 2160 hour(s)).  General Appearance: well nourished  Musculoskeletal: Strength & Muscle Tone: within normal limits Gait & Station: normal Patient leans: N/A  Psychiatric: Speech (describe rate, volume, coherence, spontaneity, and abnormalities if any): Slow but clear and coherent.  Thought Process (describe rate, content, abstract reasoning, and computation): Slow but logical and goal-directed.  Associations: Relevant, Intact and Obsessive thoughts.  Thoughts: obsessions  Mental Status: Orientation: oriented to person and place Mood & Affect: anxiety and Constricted affect. Attention Span & Concentration: Poor  Established Problem, Stable/Improving (1), New problem, with additional work up planned, Review of Psycho-Social Stressors (1), Decision to obtain old records (1), Review of Last Therapy Session (1), Review of Medication Regimen & Side Effects (2) and Review of New Medication or Change in Dosage (2)  Assessment: Axis I: Obsessive-compulsive disorder, Tourette's syndrome, Tic disorder, anxiety disorder NOS  Axis II: Deferred  Axis III: See  medical history Patient Active Problem List   Diagnosis Date Noted  . Cervical disc disorder with radiculopathy of cervical region 06/11/2013  . Acute thoracic back pain 05/28/2013  . Muscle spasm of back 03/24/2013  . Nonallopathic lesion of thoracic region 03/24/2013  . Finger wound, simple, open 12/08/2012  . Abdominal distention 12/06/2012  . Sinusitis nasal 06/04/2012  . Hyperglycemia 06/04/2012  . Weight gain 06/04/2012  . Major depressive disorder, recurrent episode, severe, without mention of psychotic behavior 05/28/2012  . Generalized anxiety disorder 05/28/2012  . Varicose veins of lower extremities with other complications 16/12/9602  . OSA (obstructive sleep apnea) 02/14/2011  . Polycythemia 12/14/2010  . Cerumen impaction 06/24/2010  . VITAMIN D DEFICIENCY 12/25/2008  . SEBORRHEIC DERMATITIS 07/23/2008  . FREQUENCY, URINARY 07/23/2008  . CELLULITIS/ABSCESS, FOOT 09/11/2007  . GLUCOSE INTOLERANCE 04/11/2007  . ALLERGIC RHINITIS 04/11/2007  . GERD 04/11/2007  . BACK PAIN 04/11/2007    Axis IV: Moderate  Axis V: 50- 55   Plan:   I will add Valium 5 mg at bedtime  to help his anxiety, insomnia .   Recommended to continue Remeron 15 mg at bedtime, Abilify 5 mg daily and Effexor 75 mg every day.  Discuss in detail the risks and benefits of medication.  Discusses benzodiazepine dependence, tolerance and withdrawal symptoms.  Followup in 3 months.  Recommended to call us back if he has any question of a concern.    Lela Murfin T., MD 09/09/2013

## 2013-09-18 ENCOUNTER — Encounter: Payer: Self-pay | Admitting: Internal Medicine

## 2013-09-18 ENCOUNTER — Ambulatory Visit (INDEPENDENT_AMBULATORY_CARE_PROVIDER_SITE_OTHER): Payer: Medicare Other | Admitting: Internal Medicine

## 2013-09-18 VITALS — BP 122/82 | HR 104 | Temp 98.2°F | Resp 14 | Wt 221.8 lb

## 2013-09-18 DIAGNOSIS — R7309 Other abnormal glucose: Secondary | ICD-10-CM | POA: Diagnosis not present

## 2013-09-18 DIAGNOSIS — R197 Diarrhea, unspecified: Secondary | ICD-10-CM

## 2013-09-18 DIAGNOSIS — R000 Tachycardia, unspecified: Secondary | ICD-10-CM

## 2013-09-18 NOTE — Patient Instructions (Signed)
Stay on clear liquids for 48-72 hours or until bowels are normal.This would include  jello, sherbert (NOT ice cream), Lipton's chicken noodle soup(NOT cream based soups),Gatorade Lite, flat Ginger ale (without High Fructose Corn Syrup),dry toast or crackers, baked potato.No milk , dairy or grease until bowels are formed. Florastor OR Align , a Pro Biotic , daily if stools are loose. Immodium AD for frankly watery stool. Report increasing pain, fever or rectal bleeding 

## 2013-09-18 NOTE — Progress Notes (Signed)
   Subjective:    Patient ID: Bruce Ellison, male    DOB: 22-Aug-1980, 33 y.o.   MRN: [id number]  HPI Pt complains of nausea and diarrhea that began 09/16/13. The pt reports eating a sandwich the morning of 7/14 before the symptoms began. He reports diarrhea 5-6x per day. The frequency has not changed. The appearance in stool were watery the first two days. Today his stools are loose. The pt does have diarrhea within 30 minutes of eating.  The nausea has been constant. He has had decreased appetite.   The pt is having associated chills and sweats. He feels fatigued and weak. He denies any travel or sick exposures.   Review of Systems  Gastrointestinal: Negative for abdominal pain and blood in stool.  Genitourinary: Negative for dysuria, hematuria and flank pain.  Allergic/Immunologic: Negative for food allergies.       Objective:   Physical Exam        Assessment & Plan:

## 2013-09-18 NOTE — Progress Notes (Signed)
   Subjective:    Patient ID: Bruce Ellison, male    DOB: 1980-06-21, 33 y.o.   MRN: [id number]  HPI   He began to have nausea and diarrhea as of 09/16/13. This occurred after eating a ham and cheese sandwich with mayonnaise on it the morning of 7/14. He had frankly diarrheal stools up to 5-6 times per day over 48 hours. The frequency has not changed but now the stools are just loose. He has been drinking ginger ale to treat persistent symptoms of nausea & decreased appetite; he has not taken any medications for this.  Although he is originally from [place] he has not had any recent international travel. He is not exposed to any ill individuals.  Review of the chart noted fasting glucose of 106 and TSH of 0.91 on 12/06/12. He is on Abilify    Review of Systems  He denies fever but has had chills & sweats.  No abdominal pain, melena, or rectal bleeding.  No chest pain , palpitations or dyspnea.  No fever , chills or sweats.  Dysuria, pyuria, or hematuria are denied.       Objective:   Physical Exam General appearance is one of good health and nourishment.In no acute distress but appears anxious.  Eyes: No conjunctival inflammation or scleral icterus is present.  Oral exam: Dental hygiene is good; lips and gums are healthy appearing.There is no oropharyngeal erythema or exudate noted. Tongue moist.  Heart:  His original pulse rate was 104. I checked it again twice and it was high as 120. S1 and S2 normal without gallop, murmur, click, rub or other extra sounds.No NVD @ 15 degrees. No HJR.   Lungs:Chest clear to auscultation; no wheezes, rhonchi,rales ,or rubs present.No increased work of breathing.   Abdomen: bowel sounds normal, soft and non-tender without masses, organomegaly or hernias noted.  No guarding or rebound .  Musculoskeletal: Able to lie flat and sit up without help. Negative straight leg raising bilaterally. Gait normal  Skin:Warm & dry.  Intact without suspicious  lesions or rashes ; no jaundice or tenting  Lymphatic: No lymphadenopathy is noted about the head, neck, axilla               Assessment & Plan:  #1 diarrhea resolving #2 tachycardia w/o clinical cardiac decompensation or dehydration. PMH GAD  #3 mild hyperglycemia; on  Abilify See orders. Labs will be checked to assess tachycardia , hyperglycemia, low TSH

## 2013-09-18 NOTE — Progress Notes (Signed)
Pre visit review using our clinic review tool, if applicable. No additional management support is needed unless otherwise documented below in the visit note. 

## 2013-09-19 ENCOUNTER — Other Ambulatory Visit (INDEPENDENT_AMBULATORY_CARE_PROVIDER_SITE_OTHER): Payer: Medicare Other

## 2013-09-19 DIAGNOSIS — R197 Diarrhea, unspecified: Secondary | ICD-10-CM | POA: Diagnosis not present

## 2013-09-19 DIAGNOSIS — R000 Tachycardia, unspecified: Secondary | ICD-10-CM | POA: Diagnosis not present

## 2013-09-19 DIAGNOSIS — R7309 Other abnormal glucose: Secondary | ICD-10-CM

## 2013-09-19 LAB — BASIC METABOLIC PANEL
BUN: 11 mg/dL (ref 6–23)
CHLORIDE: 105 meq/L (ref 96–112)
CO2: 29 meq/L (ref 19–32)
CREATININE: 0.9 mg/dL (ref 0.4–1.5)
Calcium: 9 mg/dL (ref 8.4–10.5)
GFR: 99.4 mL/min (ref >60.00)
Glucose, Bld: 102 mg/dL — ABNORMAL HIGH (ref 70–99)
Potassium: 4.1 mEq/L (ref 3.5–5.1)
SODIUM: 140 meq/L (ref 135–145)

## 2013-09-19 LAB — CBC WITH DIFFERENTIAL/PLATELET
Basophils Absolute: 0 10*3/uL (ref 0.0–0.1)
Basophils Relative: 0.4 % (ref 0.0–3.0)
EOS PCT: 7.9 % — AB (ref 0.0–5.0)
Eosinophils Absolute: 0.6 10*3/uL (ref 0.0–0.7)
HCT: 49.9 % (ref 39.0–52.0)
Hemoglobin: 17 g/dL (ref 13.0–17.0)
LYMPHS PCT: 25.1 % (ref 12.0–46.0)
Lymphs Abs: 2 10*3/uL (ref 0.7–4.0)
MCHC: 34 g/dL (ref 30.0–36.0)
MCV: 89 fl (ref 78.0–100.0)
Monocytes Absolute: 0.9 10*3/uL (ref 0.1–1.0)
Monocytes Relative: 11.3 % (ref 3.0–12.0)
NEUTROS PCT: 55.3 % (ref 43.0–77.0)
Neutro Abs: 4.3 10*3/uL (ref 1.4–7.7)
Platelets: 274 10*3/uL (ref 150.0–400.0)
RBC: 5.61 Mil/uL (ref 4.22–5.81)
RDW: 13.5 % (ref 11.5–15.5)
WBC: 7.8 10*3/uL (ref 4.0–10.5)

## 2013-09-19 LAB — HEMOGLOBIN A1C: Hgb A1c MFr Bld: 5.8 % (ref 4.6–6.5)

## 2013-09-19 LAB — TSH: TSH: 0.99 u[IU]/mL (ref 0.35–4.50)

## 2013-09-23 DIAGNOSIS — M5030 Other cervical disc degeneration, unspecified cervical region: Secondary | ICD-10-CM | POA: Diagnosis not present

## 2013-09-23 DIAGNOSIS — M5412 Radiculopathy, cervical region: Secondary | ICD-10-CM | POA: Diagnosis not present

## 2013-10-10 ENCOUNTER — Encounter: Payer: Self-pay | Admitting: Internal Medicine

## 2013-10-10 ENCOUNTER — Ambulatory Visit (INDEPENDENT_AMBULATORY_CARE_PROVIDER_SITE_OTHER): Payer: Medicare Other | Admitting: Internal Medicine

## 2013-10-10 VITALS — BP 122/77 | HR 96 | Temp 98.0°F | Wt 225.0 lb

## 2013-10-10 DIAGNOSIS — R197 Diarrhea, unspecified: Secondary | ICD-10-CM | POA: Insufficient documentation

## 2013-10-10 MED ORDER — METRONIDAZOLE 500 MG PO TABS
500.0000 mg | ORAL_TABLET | Freq: Three times a day (TID) | ORAL | Status: DC
Start: 2013-10-10 — End: 2013-10-17

## 2013-10-10 NOTE — Patient Instructions (Addendum)
Gluten free trial (no wheat products) for 4-6 weeks. OK to use gluten-free bread and gluten-free pasta.  Milk free trial (no milk, ice cream, cheese and yogurt) for 4-6 weeks. OK to use almond, coconut, rice or soy milk. "Almond breeze" brand tastes good.  

## 2013-10-10 NOTE — Assessment & Plan Note (Signed)
Recurrent lifetime

## 2013-10-10 NOTE — Progress Notes (Signed)
Pre visit review using our clinic review tool, if applicable. No additional management support is needed unless otherwise documented below in the visit note. 

## 2013-10-13 ENCOUNTER — Telehealth: Payer: Self-pay | Admitting: Internal Medicine

## 2013-10-13 NOTE — Telephone Encounter (Signed)
Left message for pt to call back.  Pt states he was seen by Dr. Hilarie Fredrickson 01/30/11. Pt states he was supposed to have a colon done then and refused. He was seen at that time for change in bowels. Pt states he is still having diarrhea and would like to schedule colon at this time. Dr. Hilarie Fredrickson do you want the pt seen 1st or a direct? Please advise.

## 2013-10-14 NOTE — Telephone Encounter (Signed)
Okay for colonoscopy.

## 2013-10-16 NOTE — Telephone Encounter (Signed)
Pt scheduled for previsit and colon.

## 2013-10-17 ENCOUNTER — Ambulatory Visit (AMBULATORY_SURGERY_CENTER): Payer: Medicare Other | Admitting: *Deleted

## 2013-10-17 VITALS — Ht 66.5 in | Wt 227.8 lb

## 2013-10-17 DIAGNOSIS — R197 Diarrhea, unspecified: Secondary | ICD-10-CM

## 2013-10-17 MED ORDER — MOVIPREP 100 G PO SOLR: 1.0000 | Freq: Once | ORAL | Status: DC

## 2013-10-17 NOTE — Progress Notes (Signed)
Denies allergies to eggs or soy products. Denies complications with sedation or anesthesia. Denies O2 use. Denies use of diet or weight loss medications.  Emmi instructions given for colonoscopy.  

## 2013-10-19 ENCOUNTER — Encounter: Payer: Self-pay | Admitting: Internal Medicine

## 2013-10-20 DIAGNOSIS — M542 Cervicalgia: Secondary | ICD-10-CM | POA: Diagnosis not present

## 2013-10-20 DIAGNOSIS — M47812 Spondylosis without myelopathy or radiculopathy, cervical region: Secondary | ICD-10-CM | POA: Diagnosis not present

## 2013-10-29 ENCOUNTER — Encounter: Payer: Self-pay | Admitting: Internal Medicine

## 2013-11-06 ENCOUNTER — Telehealth: Payer: Self-pay | Admitting: *Deleted

## 2013-11-06 NOTE — Telephone Encounter (Signed)
We can arrange for a dietary consult Thx

## 2013-11-06 NOTE — Telephone Encounter (Signed)
Pt is wanting to get a referral to see Dr. Loanne Drilling concerning his weight gain issues. Inform pt Dr. Loanne Drilling specialize in diabetes & thyroid issues which pt don't have. Pls advise on referral to whom he can see concerning weight gain...Bruce Ellison

## 2013-11-06 NOTE — Telephone Encounter (Signed)
Notified pt with md response. Pt decline to see dietician.Marland KitchenJohny Ellison

## 2013-11-13 ENCOUNTER — Ambulatory Visit (AMBULATORY_SURGERY_CENTER): Payer: Medicare Other | Admitting: Internal Medicine

## 2013-11-13 ENCOUNTER — Encounter: Payer: Self-pay | Admitting: Internal Medicine

## 2013-11-13 VITALS — BP 101/73 | HR 89 | Temp 98.8°F | Resp 14 | Ht 66.5 in | Wt 227.0 lb

## 2013-11-13 DIAGNOSIS — D126 Benign neoplasm of colon, unspecified: Secondary | ICD-10-CM

## 2013-11-13 DIAGNOSIS — D125 Benign neoplasm of sigmoid colon: Secondary | ICD-10-CM

## 2013-11-13 DIAGNOSIS — R197 Diarrhea, unspecified: Secondary | ICD-10-CM

## 2013-11-13 DIAGNOSIS — F329 Major depressive disorder, single episode, unspecified: Secondary | ICD-10-CM | POA: Diagnosis not present

## 2013-11-13 DIAGNOSIS — I10 Essential (primary) hypertension: Secondary | ICD-10-CM | POA: Diagnosis not present

## 2013-11-13 DIAGNOSIS — K529 Noninfective gastroenteritis and colitis, unspecified: Secondary | ICD-10-CM

## 2013-11-13 DIAGNOSIS — F3289 Other specified depressive episodes: Secondary | ICD-10-CM | POA: Diagnosis not present

## 2013-11-13 MED ORDER — SODIUM CHLORIDE 0.9 % IV SOLN: 500.0000 mL | INTRAVENOUS | Status: DC

## 2013-11-13 NOTE — Progress Notes (Signed)
Called to room to assist during endoscopic procedure.  Patient ID and intended procedure confirmed with present staff. Received instructions for my participation in the procedure from the performing physician.  

## 2013-11-13 NOTE — Patient Instructions (Signed)
Discharge instructions given with verbal understanding. Handout on polyps. Resume previous medications. YOU HAD AN ENDOSCOPIC PROCEDURE TODAY AT THE [facility]: Refer to the procedure report that was given to you for any specific questions about what was found during the examination.  If the procedure report does not answer your questions, please call your gastroenterologist to clarify.  If you requested that your care partner not be given the details of your procedure findings, then the procedure report has been included in a sealed envelope for you to review at your convenience later.  YOU SHOULD EXPECT: Some feelings of bloating in the abdomen. Passage of more gas than usual.  Walking can help get rid of the air that was put into your GI tract during the procedure and reduce the bloating. If you had a lower endoscopy (such as a colonoscopy or flexible sigmoidoscopy) you may notice spotting of blood in your stool or on the toilet paper. If you underwent a bowel prep for your procedure, then you may not have a normal bowel movement for a few days.  DIET: Your first meal following the procedure should be a light meal and then it is ok to progress to your normal diet.  A half-sandwich or bowl of soup is an example of a good first meal.  Heavy or fried foods are harder to digest and may make you feel nauseous or bloated.  Likewise meals heavy in dairy and vegetables can cause extra gas to form and this can also increase the bloating.  Drink plenty of fluids but you should avoid alcoholic beverages for 24 hours.  ACTIVITY: Your care partner should take you home directly after the procedure.  You should plan to take it easy, moving slowly for the rest of the day.  You can resume normal activity the day after the procedure however you should NOT DRIVE or use heavy machinery for 24 hours (because of the sedation medicines used during the test).    SYMPTOMS TO REPORT IMMEDIATELY: A  gastroenterologist can be reached at any hour.  During normal business hours, 8:30 AM to 5:00 PM Monday through Friday, call [phone].  After hours and on weekends, please call the GI answering service at [phone] who will take a message and have the physician on call contact you.   Following lower endoscopy (colonoscopy or flexible sigmoidoscopy):  Excessive amounts of blood in the stool  Significant tenderness or worsening of abdominal pains  Swelling of the abdomen that is new, acute  Fever of 100F or higher  FOLLOW UP: If any biopsies were taken you will be contacted by phone or by letter within the next 1-3 weeks.  Call your gastroenterologist if you have not heard about the biopsies in 3 weeks.  Our staff will call the home number listed on your records the next business day following your procedure to check on you and address any questions or concerns that you may have at that time regarding the information given to you following your procedure. This is a courtesy call and so if there is no answer at the home number and we have not heard from you through the emergency physician on call, we will assume that you have returned to your regular daily activities without incident.  SIGNATURES/CONFIDENTIALITY: You and/or your care partner have signed paperwork which will be entered into your electronic medical record.  These signatures attest to the fact that that the information above on your After Visit Summary has been   reviewed and is understood.  Full responsibility of the confidentiality of this discharge information lies with you and/or your care-partner. 

## 2013-11-13 NOTE — Progress Notes (Signed)
Report to PACU, RN, vss, BBS= Clear.  

## 2013-11-13 NOTE — Op Note (Signed)
[facility]  [address] [place], [id number]   COLONOSCOPY PROCEDURE REPORT  PATIENT: Bruce, Ellison  MR#: [id number] BIRTHDATE: 04/22/1980 , 33  yrs. old GENDER: Male ENDOSCOPIST: Jerene Bears, MD REFERRED DJ:MEQA Avel Sensor, M.D. PROCEDURE DATE:  11/13/2013 PROCEDURE:   Colonoscopy with biopsy and Colonoscopy with cold biopsy polypectomy First Screening Colonoscopy - Avg.  risk and is 50 yrs.  old or older - No.  Prior Negative Screening - Now for repeat screening. N/A  History of Adenoma - Now for follow-up colonoscopy & has been > or = to 3 yrs.  N/A  Polyps Removed Today? Yes. ASA CLASS:   Class II INDICATIONS:chronic diarrhea. MEDICATIONS: MAC sedation, administered by CRNA and propofol (Diprivan) 340mg  IV  DESCRIPTION OF PROCEDURE:   After the risks benefits and alternatives of the procedure were thoroughly explained, informed consent was obtained.  A digital rectal exam revealed no rectal mass.   The LB [id number]  endoscope was introduced through the anus and advanced to the terminal ileum which was intubated for a short distance. No adverse events experienced.   The quality of the prep was good, using MoviPrep  The instrument was then slowly withdrawn as the colon was fully examined.   COLON FINDINGS: The mucosa appeared normal in the terminal ileum. A diminutive sessile polyp, measuring 3 mm in size, was found in the sigmoid colon.  A polypectomy was performed with cold forceps. The resection was complete and the polyp tissue was completely retrieved.   The colon mucosa was otherwise normal.  Retroflexed views revealed internal hemorrhoids. The time to cecum=3 minutes 33 seconds.  Withdrawal time=10 minutes 54 seconds.  The scope was withdrawn and the procedure completed. COMPLICATIONS: There were no complications.  ENDOSCOPIC IMPRESSION: 1.   Normal mucosa in the terminal ileum 2.   Diminutive sessile polyp, measuring 3 mm in size, was  found in the sigmoid colon; polypectomy was performed with cold forceps 3.   The colon mucosa was otherwise normal  RECOMMENDATIONS: 1.  Await pathology results; repeat colonoscopy interval to be based on pathology results. If polyp removed today is non-adenomatous, then recommend repeat colonoscopy at age 41 for screening 2.  Loperamide (Imodium) can be used per box instructions for diarrhea 3.  Office followup next available   eSigned:  Jerene Bears, MD 11/13/2013 3:48 PM  cc: The Patient and [facility] [place].  Plotnikov, MD

## 2013-11-14 ENCOUNTER — Telehealth: Payer: Self-pay | Admitting: *Deleted

## 2013-11-14 NOTE — Telephone Encounter (Signed)
  Follow up Call-  Call back number 11/13/2013  Post procedure Call Back phone  # [phone]  Permission to leave phone message Yes     Patient questions:  Do you have a fever, pain , or abdominal swelling? No. Pain Score  0 *  Have you tolerated food without any problems? Yes.    Have you been able to return to your normal activities? Yes.    Do you have any questions about your discharge instructions: Diet   No. Medications  No. Follow up visit  Yes.    Do you have questions or concerns about your Care? No.  Actions: * If pain score is 4 or above: No action needed, pain <4. Pt states his November appt that was scheduled yesterday doesn't work for him adn he wants to reschedule. Instructed pt to call dr pyrtle's office at [phone] and they could RS to a day that works better for pt. Pt verbalized understanding of instructions given. ewm

## 2013-11-18 ENCOUNTER — Encounter: Payer: Self-pay | Admitting: Internal Medicine

## 2013-11-19 ENCOUNTER — Encounter: Payer: Self-pay | Admitting: *Deleted

## 2013-11-19 ENCOUNTER — Other Ambulatory Visit: Payer: Self-pay

## 2013-11-19 MED ORDER — RIFAXIMIN 550 MG PO TABS: 550.0000 mg | ORAL_TABLET | Freq: Three times a day (TID) | ORAL | Status: DC

## 2013-11-20 ENCOUNTER — Encounter: Payer: Self-pay | Admitting: *Deleted

## 2013-11-21 ENCOUNTER — Telehealth: Payer: Self-pay | Admitting: *Deleted

## 2013-11-21 NOTE — Telephone Encounter (Signed)
Per [facility], patient's xifaxan prior auth was started yesterday. We are waiting on a response from insurance company.

## 2013-11-24 NOTE — Telephone Encounter (Signed)
Per [facility], patient tells them that they do not need to do a prior authorization on xifaxan because he is not interested in taking the medication anyways.

## 2013-11-24 NOTE — Telephone Encounter (Signed)
I had previously discussed rifaximin with patient and his mother He was interested, could you please check with him as treatment for chronic diarrhea

## 2013-11-25 NOTE — Telephone Encounter (Signed)
Patient states that he has decided not to take medication. He states "if I have irritable bowel syndrome, then its not that dangerous and I dont want to take the medication."

## 2013-11-25 NOTE — Telephone Encounter (Signed)
Noted, he can followup as recommended

## 2013-11-27 DIAGNOSIS — S9030XA Contusion of unspecified foot, initial encounter: Secondary | ICD-10-CM | POA: Diagnosis not present

## 2013-11-30 ENCOUNTER — Other Ambulatory Visit (HOSPITAL_COMMUNITY): Payer: Self-pay | Admitting: Psychiatry

## 2013-12-04 ENCOUNTER — Ambulatory Visit: Payer: Self-pay | Admitting: Internal Medicine

## 2013-12-05 ENCOUNTER — Ambulatory Visit: Payer: Self-pay | Admitting: Internal Medicine

## 2013-12-06 ENCOUNTER — Ambulatory Visit (INDEPENDENT_AMBULATORY_CARE_PROVIDER_SITE_OTHER): Payer: Medicare Other | Admitting: Family Medicine

## 2013-12-06 ENCOUNTER — Encounter: Payer: Self-pay | Admitting: Family Medicine

## 2013-12-06 VITALS — BP 104/60 | Temp 98.4°F | Wt 225.0 lb

## 2013-12-06 DIAGNOSIS — R1011 Right upper quadrant pain: Secondary | ICD-10-CM | POA: Diagnosis not present

## 2013-12-06 NOTE — Progress Notes (Signed)
   Subjective:    Patient ID: Bruce Ellison, male    DOB: 12-04-1980, 33 y.o.   MRN: [id number]  Abdominal Pain Associated symptoms include nausea. Pertinent negatives include no diarrhea, fever or vomiting.   Acute visit to [facility] with right upper quadrant abdominal pain. Duration approximately one week. Patient describes a dull achy pain which comes and goes. He has had some diminished appetite and possibly symptoms worse after eating. Occasional nausea but no vomiting. No hematemesis. No hematochezia. No radiation of pain. Pain is moderate severity. No chest pains.  Denies any recent injury. No cough or dyspnea. Had recent colonoscopy for intermittent diarrhea. No diarrhea currently. Colonoscopy showed only one benign polyp.  Current symptoms are somewhat exacerbated by movement but also occur at rest. No alleviating factors. Denies any associated fever or chills.  Past Medical History  Diagnosis Date  . Anxiety   . OCD (obsessive compulsive disorder)   . Personality disorder     with schizoid features  . Tourette's     per [facility]  . Insomnia   . HTN (hypertension)     borderline  . Obesity   . Allergic rhinitis   . Glucose intolerance (impaired glucose tolerance)   . GERD (gastroesophageal reflux disease)   . Vitamin D deficiency   . Depression   . Sleep apnea    Past Surgical History  Procedure Laterality Date  . Adnoidectomy  1992    reports that he has never smoked. He has never used smokeless tobacco. He reports that he does not drink alcohol or use illicit drugs. family history includes Bipolar disorder in his father; Cholelithiasis in his maternal grandmother; Colon cancer in his other; Colon polyps in his maternal grandfather; Depression in his mother; Diabetes in his maternal grandmother; Prostate cancer in his maternal grandfather. There is no history of Esophageal cancer, Rectal cancer, or Stomach cancer. Allergies  Allergen Reactions  . Honey Bee  Treatment [Bee Venom] Anaphylaxis, Hives and Rash    Pt reports he does not go into anaphylaxis with bee stings, just honey  . Doxycycline   . Quetiapine Other (See Comments)    restlessness  . Penicillins Hives and Rash  . Sulfa Antibiotics Hives and Rash      Review of Systems  Constitutional: Positive for appetite change. Negative for fever and chills.  Respiratory: Negative for cough and shortness of breath.   Cardiovascular: Negative for chest pain.  Gastrointestinal: Positive for nausea and abdominal pain. Negative for vomiting, diarrhea, blood in stool and abdominal distention.       Objective:   Physical Exam  Constitutional: He appears well-developed and well-nourished.  Cardiovascular: Normal rate.   Pulmonary/Chest: Effort normal and breath sounds normal. No respiratory distress. He has no wheezes. He has no rales.  Abdominal: Soft. Bowel sounds are normal. He exhibits no distension and no mass. There is no rebound and no guarding.  Slightly tender right upper quadrant to deep palpation. No guarding or rebound. No hepatomegaly.          Assessment & Plan:  Intermittent right upper quadrant abdominal pain. Non-acute abdomen and no signs of infection at this time. Rule out symptomatic chlolelithiasis. Set up ultrasound to further evaluate.

## 2013-12-06 NOTE — Patient Instructions (Signed)
???? ? ??????  (  Abdominal Pain) °???? ? ?????? ????? ???? ??????????? ??????? ?????????. ??? ???????, ???? ? ??????, ?? ????????? ? ?????-???? ????????????, ???????? ??? ???????. ? ?????? ???? ? ?????? ???????? ????? ????? ????????? ? ?????? ? ???????? ????????. ??? ??????? ???? ???????? ??????????? ?????? ?, ????????, ???????? ??????? ????? ? ???????, ????? ?????????? ??????????? ????????? ????. ??? ?? ?????, ?? ?????? ??????? ????? ?????? ?????? ???????, ?????? ??? ????? ??????????? ?????? ??????? ????. ?? ????? ??????? ??? ??????? ???? ????? ?? ?????, ????????? ?? ??? ?????????? ?????????????? ?????? ??? ?????? ?????????? ???????. °?????????? ?? ????? ? ???????? ????????  °??????? ?? ?????? ??????????? ????? ???? ? ??????. ????????? ????????? ?????????? ????? ?????????: °· ?????????? ?????? ??????????? ?????? ?????????????? ??? ??????????? ?????????. °· ?? ??????? ????????? ????????????, ???? ?? ?? ???????? ??? ????. °· ?????????? ?????? ????? (??????, ??? ??? ????), ? ???????????? ? ?????????? ?????? ?????. ?? ???? ????????????? ???????? ????????? ?????? ? ??????? ??????? ?????. °?????????? ? ?????, ????: °· ? ??? ????????? ???????????? ???? ? ??????? ??????. °· ? ??? ?????????? ???? ? ??????, ????????? ? ???????? ??? ???????. °· ? ??? ?????????? ???? ??? ?????????????? ??? ?????????. °· ?? ??????????? ???? ? ??????, ??????? ????? ??? ?? ?????. °· ???? ? ?????? ??????????? ??? ??????????? ??? ?????? ????. °· ???? ? ?????? ???????????? ??? ???????????? ?????? ????. °· ? ??? ?????????? ???????????. °?????????? ?????????? ? ?????, ????:  °· ???? ?? ???????? ? ??????? 2 ?????. °· ??? ??????????? ???? (?????). °· ???? ????????? ?????? ? ???????????? ????? ??????, ????????, ? ?????? ??? ????? ?????? ????? ??????. °· ? ??? ???????? ???? ?? ??????? ????? ??? ?????? ????????????? ????. °?????????, ??? ??: °· ????????? ?????? ??????????. °· ?????? ?????????????? ??????? ?? ????? ??????????. °· ??????????????? ??????????  ? ?????, ???? ??? ?? ?????????? ????? ??? ?????????? ????. °Document Released: 02/20/2005 Document Revised: 02/25/2013 °ExitCare® Patient Information ©2015 [employer]. This information is not intended to replace advice given to you by your health care provider. Make sure you discuss any questions you have with your health care provider. ° °

## 2013-12-08 ENCOUNTER — Ambulatory Visit: Payer: Self-pay | Admitting: Internal Medicine

## 2013-12-10 ENCOUNTER — Telehealth: Payer: Self-pay | Admitting: *Deleted

## 2013-12-10 ENCOUNTER — Encounter (HOSPITAL_COMMUNITY): Payer: Self-pay | Admitting: Psychiatry

## 2013-12-10 ENCOUNTER — Ambulatory Visit (INDEPENDENT_AMBULATORY_CARE_PROVIDER_SITE_OTHER): Payer: Medicare Other | Admitting: Psychiatry

## 2013-12-10 VITALS — BP 122/71 | HR 86 | Ht 66.0 in | Wt 220.8 lb

## 2013-12-10 DIAGNOSIS — F952 Tourette's disorder: Secondary | ICD-10-CM | POA: Diagnosis not present

## 2013-12-10 DIAGNOSIS — F42 Obsessive-compulsive disorder: Secondary | ICD-10-CM

## 2013-12-10 DIAGNOSIS — F419 Anxiety disorder, unspecified: Secondary | ICD-10-CM | POA: Diagnosis not present

## 2013-12-10 DIAGNOSIS — F429 Obsessive-compulsive disorder, unspecified: Secondary | ICD-10-CM

## 2013-12-10 MED ORDER — ARIPIPRAZOLE 5 MG PO TABS: ORAL_TABLET | ORAL | Status: DC

## 2013-12-10 MED ORDER — MIRTAZAPINE 15 MG PO TABS: 15.0000 mg | ORAL_TABLET | Freq: Every day | ORAL | Status: DC

## 2013-12-10 MED ORDER — LORAZEPAM 0.5 MG PO TABS: 0.5000 mg | ORAL_TABLET | Freq: Two times a day (BID) | ORAL | Status: DC

## 2013-12-10 MED ORDER — VENLAFAXINE HCL ER 75 MG PO CP24: 75.0000 mg | ORAL_CAPSULE | Freq: Every day | ORAL | Status: DC

## 2013-12-10 NOTE — Progress Notes (Signed)
[facility] Progress Note  Bruce Ellison [id number] 33 y.o.  12/10/2013 4:21 PM  Chief Complaint: Medication management and followup.              History of Present Illness:  Bruce Ellison came for his appointment.  He cut down his Abilify to half tablet because he was having "convulsions "last week.  Patient denies a plus tremors or shakes.  He was scared to take Abilify 5 mg.  Since he is taking Abilify 2.5 mg he is feeling better.  He has noticed increasing anxiety and nervousness.  On his last visit we have provided Valium but patient did not took it.  He remembered having issues with the Valium in the past and it did not work.  He wants to try Ativan.  He is sleeping better.  He denies any agitation, anger, mood swings.  He denies any major panic attack.  It is unclear if the Abilify causing a convulsion since he has been taking Abilify for a long time.  His OCD symptoms are under control.  His appetite is okay.  His vitals are stable.  He has no paranoia or any delusions.  Patient lives with his mother and his grandparents.  Suicidal Ideation: No Plan Formed: No Patient has means to carry out plan: No  Homicidal Ideation: No Plan Formed: No Patient has means to carry out plan: No  Review of Systems: Psychiatric: Agitation: No Hallucination: No Depressed Mood: No Insomnia: Yes Hypersomnia: No Altered Concentration: No Feels Worthless: No Grandiose Ideas: No Belief In Special Powers: No New/Increased Substance Abuse: No Compulsions: No  Review of Systems  Constitutional: Negative.   Skin: Negative.   Psychiatric/Behavioral: Negative for substance abuse. The patient is nervous/anxious.      Past Medical History  Diagnosis Date  . Anxiety   . OCD (obsessive compulsive disorder)   . Personality disorder     with schizoid features  . Tourette's     per [facility]  . Insomnia   . HTN (hypertension)     borderline  . Obesity   . Allergic rhinitis   .  Glucose intolerance (impaired glucose tolerance)   . GERD (gastroesophageal reflux disease)   . Vitamin D deficiency   . Depression   . Sleep apnea    His primary care physician is Dr. Christianne Ellison.   Social history.   Patient was born in [place], [place].  At age 9 his parents got divorced.  He moved the [place] at age 34.  He is living with his mother and grandparents.  Patient father still lives in [place].  Patient has no contact with his father.  Patient has never married.  He has no children.  He has limited social network.  Outpatient Encounter Prescriptions as of 12/10/2013  Medication Sig  . ARIPiprazole (ABILIFY) 5 MG tablet TAKE 1 TABLET BY MOUTH AT BEDTIME  . desonide (DESOWEN) 0.05 % cream   . LORazepam (ATIVAN) 0.5 MG tablet Take 1 tablet (0.5 mg total) by mouth 2 (two) times daily.  . mirtazapine (REMERON) 15 MG tablet Take 1 tablet (15 mg total) by mouth at bedtime.  . Multiple Vitamin (MULTIVITAMIN WITH MINERALS) TABS Take 1 tablet by mouth every morning.  . rifaximin (XIFAXAN) 550 MG TABS tablet Take 1 tablet (550 mg total) by mouth 3 (three) times daily.  [name] venlafaxine XR (EFFEXOR-XR) 75 MG 24 hr capsule Take 1 capsule (75 mg total) by mouth daily.  . [DISCONTINUED] ARIPiprazole (ABILIFY)  5 MG tablet TAKE 1 TABLET BY MOUTH AT BEDTIME  . [DISCONTINUED] mirtazapine (REMERON) 15 MG tablet Take 1 tablet (15 mg total) by mouth at bedtime.  . [DISCONTINUED] venlafaxine XR (EFFEXOR-XR) 75 MG 24 hr capsule Take 1 capsule (75 mg total) by mouth daily.    Past Psychiatric History/Hospitalization(s): Anxiety: Patient has history of psychiatric illness at age 2.  At that time he was in [place].  He has Tic, anxiety and anger. Bipolar Disorder: No Depression: Yes, he had tried Zoloft, Prozac and recently Luvox. Mania: No Psychosis: Patient has history of paranoia.  In the past he had tried Geodon, Risperdal and Seroquel.  He was seeing psychiatrist at [facility].   In the past he was also to get treatment in [place].  He was seeing psychiatrist in [place] in [place].  He&amp;#39;s been taking the medication since age 65.  Recently his been seeing Dr. Toy Care who had prescribed her Zoloft, Valium, Klonopin, Xanax, Prozac and Ambien.  We also tried luvox however he developed more irritability. Schizophrenia: No Personality Disorder: No Hospitalization for psychiatric illness: No History of Electroconvulsive Shock Therapy: No Prior Suicide Attempts: No  Physical Exam: Constitutional:  BP 122/71  Pulse 86  Ht 5\' 6"  (1.676 m)  Wt 220 lb 12.8 oz (100.154 kg)  BMI 35.65 kg/m2  Recent Results (from the past 2160 hour(s))  BASIC METABOLIC PANEL     Status: Abnormal   Collection Time    09/19/13  7:35 AM      Result Value Ref Range   Sodium 140  135 - 145 mEq/L   Potassium 4.1  3.5 - 5.1 mEq/L   Chloride 105  96 - 112 mEq/L   CO2 29  19 - 32 mEq/L   Glucose, Bld 102 (*) 70 - 99 mg/dL   BUN 11  6 - 23 mg/dL   Creatinine, Ser 0.9  0.4 - 1.5 mg/dL   Calcium 9.0  8.4 - 10.5 mg/dL   GFR 99.40  >60.00 mL/min  CBC WITH DIFFERENTIAL     Status: Abnormal   Collection Time    09/19/13  7:35 AM      Result Value Ref Range   WBC 7.8  4.0 - 10.5 K/uL   RBC 5.61  4.22 - 5.81 Mil/uL   Hemoglobin 17.0  13.0 - 17.0 g/dL   HCT 49.9  39.0 - 52.0 %   MCV 89.0  78.0 - 100.0 fl   MCHC 34.0  30.0 - 36.0 g/dL   RDW 13.5  11.5 - 15.5 %   Platelets 274.0  150.0 - 400.0 K/uL   Neutrophils Relative % 55.3  43.0 - 77.0 %   Lymphocytes Relative 25.1  12.0 - 46.0 %   Monocytes Relative 11.3  3.0 - 12.0 %   Eosinophils Relative 7.9 (*) 0.0 - 5.0 %   Basophils Relative 0.4  0.0 - 3.0 %   Neutro Abs 4.3  1.4 - 7.7 K/uL   Lymphs Abs 2.0  0.7 - 4.0 K/uL   Monocytes Absolute 0.9  0.1 - 1.0 K/uL   Eosinophils Absolute 0.6  0.0 - 0.7 K/uL   Basophils Absolute 0.0  0.0 - 0.1 K/uL  HEMOGLOBIN A1C     Status: None   Collection Time    09/19/13  7:35 AM      Result Value Ref  Range   Hemoglobin A1C 5.8  4.6 - 6.5 %   Comment: Glycemic Control Guidelines for People with  Diabetes:Non Diabetic:  <6%Goal of Therapy: <7%Additional Action Suggested:  >8%   TSH     Status: None   Collection Time    09/19/13  7:35 AM      Result Value Ref Range   TSH 0.99  0.35 - 4.50 uIU/mL    General Appearance: well nourished  Musculoskeletal: Strength & Muscle Tone: within normal limits Gait & Station: normal Patient leans: N/A  Psychiatric: Speech (describe rate, volume, coherence, spontaneity, and abnormalities if any): Slow but clear and coherent.  Thought Process (describe rate, content, abstract reasoning, and computation): Slow but logical and goal-directed.  Associations: Relevant, Intact and Obsessive thoughts.  Thoughts: obsessions  Mental Status: Orientation: oriented to person and place Mood & Affect: anxiety and Constricted affect. Attention Span & Concentration: Poor  Established Problem, Stable/Improving (1), Review of Last Therapy Session (1), Review of Medication Regimen & Side Effects (2) and Review of New Medication or Change in Dosage (2)  Assessment: Axis I: Obsessive-compulsive disorder, Tourette's syndrome, Tic disorder, anxiety disorder NOS  Axis II: Deferred  Axis III: See medical history Patient Active Problem List   Diagnosis Date Noted  . Diarrhea 10/10/2013  . Cervical disc disorder with radiculopathy of cervical region 06/11/2013  . Acute thoracic back pain 05/28/2013  . Muscle spasm of back 03/24/2013  . Nonallopathic lesion of thoracic region 03/24/2013  . Finger wound, simple, open 12/08/2012  . Abdominal distention 12/06/2012  . Sinusitis nasal 06/04/2012  . Hyperglycemia 06/04/2012  . Weight gain 06/04/2012  . Major depressive disorder, recurrent episode, severe, without mention of psychotic behavior 05/28/2012  . Generalized anxiety disorder 05/28/2012  . Varicose veins of lower extremities with other complications  65/78/4696  . OSA (obstructive sleep apnea) 02/14/2011  . Polycythemia 12/14/2010  . Cerumen impaction 06/24/2010  . VITAMIN D DEFICIENCY 12/25/2008  . SEBORRHEIC DERMATITIS 07/23/2008  . FREQUENCY, URINARY 07/23/2008  . CELLULITIS/ABSCESS, FOOT 09/11/2007  . GLUCOSE INTOLERANCE 04/11/2007  . ALLERGIC RHINITIS 04/11/2007  . GERD 04/11/2007  . BACK PAIN 04/11/2007    Axis IV: Moderate  Axis V: 50- 55   Plan:   It is unclear if Abilify causing any convulsions recommended to take half tablet of Abilify however if the patient is started to get worse he should go back to 5 mg.  I will discontinue Valium since it did not help in the past.  I will try lorazepam 0.5 mg twice a day for severe anxiety.  Recommended to call us back if he has any question or any concern.  I reviewed his blood work and collateral information from his primary care physician.  Followup in 3 months.  Continue Remeron 15 mg at bedtime and Effexor 75 mg every day.  Discuss in detail the risks and benefits of medication.  Discusses benzodiazepine dependence, tolerance and withdrawal symptoms.  Followup in 3 months. Time spent 25 minutes.  More than 50% of the time spent and psychoeducation, constant and coordination of care.    Odena Mcquaid T., MD 12/10/2013

## 2013-12-10 NOTE — Telephone Encounter (Signed)
[facility] Triage Call Report Triage Record Num: [id number] Operator: Wynona Dove Patient Name: Bruce Ellison Call Date & Time: 12/05/2013 8:17:58PM Patient Phone: [phone] PCP: Walker Kehr Patient Gender: Male PCP Fax : [phone] Patient DOB: January 16, 1981 [facility] Name[facility] Reason for Call: Caller: [place]/Patient; PCP: Walker Kehr (Adults only); CB#: [phone]; Call regarding Upper abdominal pain; Onset 11/28/2013 of upper abd pain. Describes the pain as moderate. Has effected his appetite and is worse with activity. Feels mildly SOB when sitting and more SOB with activity. Afebrile. Triaged with Abdominal Pain guideline and disposition of See ED immediately by "Generalized or localized abdominal pain made worse with cough, movement or standing up straight" Caller says he would like to have an appt for the am and does not want to be seen in ED. Appt made for am 12/06/2013 at 10:15 with Dr Elease Hashimoto. Caller verbalized understanding. Protocol(s) Used: Abdominal Pain Recommended Outcome per Protocol: See ED Immediately Reason for Outcome: Generalized or localized pain made worse with cough, movement, or standing up straight Care Advice: ~ Another adult should drive. ~ Pain medication or laxatives should not be taken until symptoms are evaluated. ~ Do not eat or drink anything until evaluated by provider. Call EMS 911 if signs and symptoms of shock develop (such as unable to stand due to faintness, dizziness, or lightheadedness; new onset of confusion; slow to respond or difficult to awaken; skin is pale, gray, cool, or moist to touch; severe weakness; loss of consciousness). ~ ~ IMMEDIATE ACTION Write down provider's name. List or place the following in a bag for transport with the patient: current prescription and/or nonprescription medications; alternative treatments, therapies and medications; and street drugs. ~ 10/

## 2013-12-10 NOTE — Telephone Encounter (Signed)
[facility] Triage Call Report Triage Record Num: [id number] Operator: Amada Kingfisher Patient Name: Bruce Ellison Call Date & Time: 12/06/2013 4:21:13PM Patient Phone: [phone] PCP: Walker Kehr Patient Gender: Male PCP Fax : [phone] Patient DOB: 18-Feb-1981 [facility] Name[facility] Reason for Call: Caller: Alexanderjames/Patient; PCP: Walker Kehr (Adults only); CB#: [phone]; Call regarding Unable to sleep; onset 1 wk ago; was sleeping 5hrs at the beginning of the wk and now is down to 2-3hrs; denies energy; has depression; has appt with psych 10/7; taking meds like normal; seen by Dr.Burchette today and no mention of these sxs; All emergent sxs of Sleep Disorders protocol r/o per persistent insomnia resulting in < 4hrs sleep per night and not previously evaluated"; requested that on call MD be called; spoke with Dr.Burchette; advised trying Benadryl or Melatonin and if no improvement, would need to talk to his PCP 10/5 Protocol(s) Used: Sleep Disorders Recommended Outcome per Protocol: See Provider within 72 Hours Reason for Outcome: Persistent insomnia resulting in less than 4 hours sleep per night AND not previously evaluated Care Advice: Avoid the use of stimulants including caffeine (coffee, some soft drinks, some energy drinks, tea and chocolate), cocaine, and amphetamines. Also avoid drinking alcohol. ~ ~ SYMPTOM / CONDITION MANAGEMENT ~ Maintain a nutritious diet, with a healthy balance of fresh, natural foods 10/

## 2013-12-16 ENCOUNTER — Ambulatory Visit
Admission: RE | Admit: 2013-12-16 | Discharge: 2013-12-16 | Disposition: A | Discharge status: Home / Self Care | Payer: Medicare Other | Source: Ambulatory Visit | Attending: Family Medicine | Admitting: Family Medicine

## 2013-12-16 DIAGNOSIS — R1011 Right upper quadrant pain: Secondary | ICD-10-CM | POA: Diagnosis not present

## 2013-12-19 ENCOUNTER — Ambulatory Visit (INDEPENDENT_AMBULATORY_CARE_PROVIDER_SITE_OTHER): Payer: Medicare Other | Admitting: Internal Medicine

## 2013-12-19 ENCOUNTER — Encounter: Payer: Self-pay | Admitting: Internal Medicine

## 2013-12-19 VITALS — BP 118/64 | Temp 98.5°F | Wt 227.0 lb

## 2013-12-19 DIAGNOSIS — G4733 Obstructive sleep apnea (adult) (pediatric): Secondary | ICD-10-CM | POA: Diagnosis not present

## 2013-12-19 DIAGNOSIS — G479 Sleep disorder, unspecified: Secondary | ICD-10-CM | POA: Diagnosis not present

## 2013-12-19 DIAGNOSIS — F411 Generalized anxiety disorder: Secondary | ICD-10-CM | POA: Diagnosis not present

## 2013-12-19 NOTE — Progress Notes (Signed)
   Subjective:    HPI  C/o insomnia, fatigue He is very busy helping his ailing GM  BP Readings from Last 3 Encounters:  12/19/13 118/64  12/10/13 122/71  12/06/13 104/60    Wt Readings from Last 3 Encounters:  12/19/13 227 lb (102.967 kg)  12/10/13 220 lb 12.8 oz (100.154 kg)  12/06/13 225 lb (102.059 kg)      Review of Systems  Constitutional: Negative for appetite change, fatigue and unexpected weight change.  HENT: Negative for congestion, nosebleeds, sneezing, sore throat and trouble swallowing.   Eyes: Negative for itching and visual disturbance.  Respiratory: Negative for cough, chest tightness and shortness of breath.   Cardiovascular: Negative for chest pain, palpitations and leg swelling.  Gastrointestinal: Negative for nausea, vomiting, abdominal pain, diarrhea, constipation, blood in stool and abdominal distention.  Genitourinary: Negative for frequency and hematuria.  Musculoskeletal: Negative for back pain, gait problem, joint swelling and neck pain.  Skin: Negative for rash.  Neurological: Negative for dizziness, tremors, speech difficulty and weakness.  Psychiatric/Behavioral: Positive for sleep disturbance. Negative for suicidal ideas, dysphoric mood and agitation. The patient is nervous/anxious.        Objective:   Physical Exam  Constitutional: He is oriented to person, place, and time. He appears well-developed. No distress.  Obese NAD  HENT:  Mouth/Throat: Oropharynx is clear and moist.  Eyes: Conjunctivae are normal. Pupils are equal, round, and reactive to light.  Neck: Normal range of motion. No JVD present. No thyromegaly present.  Cardiovascular: Normal rate, regular rhythm, normal heart sounds and intact distal pulses.  Exam reveals no gallop and no friction rub.   No murmur heard. Pulmonary/Chest: Effort normal and breath sounds normal. No respiratory distress. He has no wheezes. He has no rales. He exhibits no tenderness.  Abdominal: Soft.  Bowel sounds are normal. He exhibits no distension and no mass. There is no tenderness. There is no rebound and no guarding.  Musculoskeletal: Normal range of motion. He exhibits no edema and no tenderness.  Lymphadenopathy:    He has no cervical adenopathy.  Neurological: He is alert and oriented to person, place, and time. He has normal reflexes. No cranial nerve deficit. He exhibits normal muscle tone. He displays a negative Romberg sign. Coordination and gait normal.  No meningeal signs  Skin: Skin is warm and dry. No rash noted.  Psychiatric: He has a normal mood and affect. His behavior is normal. Judgment and thought content normal.   Lab Results  Component Value Date   WBC 7.8 09/19/2013   HGB 17.0 09/19/2013   HCT 49.9 09/19/2013   PLT 274.0 09/19/2013   GLUCOSE 102* 09/19/2013   CHOL 203* 08/30/2010   TRIG 81.0 08/30/2010   HDL 46.80 08/30/2010   LDLDIRECT 136.9 08/30/2010   LDLCALC 130* 05/02/2006   ALT 35 12/06/2012   AST 26 12/06/2012   NA 140 09/19/2013   K 4.1 09/19/2013   CL 105 09/19/2013   CREATININE 0.9 09/19/2013   BUN 11 09/19/2013   CO2 29 09/19/2013   TSH 0.99 09/19/2013   HGBA1C 5.8 09/19/2013          Assessment & Plan:

## 2013-12-19 NOTE — Progress Notes (Signed)
Pre visit review using our clinic review tool, if applicable. No additional management support is needed unless otherwise documented below in the visit note. 

## 2013-12-23 ENCOUNTER — Telehealth (HOSPITAL_COMMUNITY): Payer: Self-pay

## 2013-12-25 ENCOUNTER — Telehealth (HOSPITAL_COMMUNITY): Payer: Self-pay

## 2013-12-25 NOTE — Telephone Encounter (Signed)
I returned patient's phone call.  Now he wants to increase Remeron 30 mg.  I explained that he was taking 30 mg before and he was complaining of BPH and issue with urination.  Patient remember and decided to stay on 15 mg.  I will not change Remeron dose at this time.

## 2013-12-28 DIAGNOSIS — G479 Sleep disorder, unspecified: Secondary | ICD-10-CM | POA: Insufficient documentation

## 2013-12-28 NOTE — Assessment & Plan Note (Signed)
Chronic Sleep hygeane issues discussed

## 2013-12-28 NOTE — Assessment & Plan Note (Signed)
Risks associated with OSA treatment noncompliance were discussed. Compliance was encouraged.

## 2013-12-28 NOTE — Assessment & Plan Note (Signed)
Discussed F/u w/psychiatry

## 2013-12-31 ENCOUNTER — Ambulatory Visit (INDEPENDENT_AMBULATORY_CARE_PROVIDER_SITE_OTHER): Payer: Medicare Other | Admitting: Family Medicine

## 2013-12-31 ENCOUNTER — Encounter: Payer: Self-pay | Admitting: Family Medicine

## 2013-12-31 VITALS — BP 122/82 | HR 84 | Ht 65.0 in | Wt 230.0 lb

## 2013-12-31 DIAGNOSIS — M549 Dorsalgia, unspecified: Secondary | ICD-10-CM | POA: Diagnosis not present

## 2013-12-31 DIAGNOSIS — M9908 Segmental and somatic dysfunction of rib cage: Secondary | ICD-10-CM | POA: Diagnosis not present

## 2013-12-31 DIAGNOSIS — M9902 Segmental and somatic dysfunction of thoracic region: Secondary | ICD-10-CM | POA: Diagnosis not present

## 2013-12-31 DIAGNOSIS — M546 Pain in thoracic spine: Secondary | ICD-10-CM

## 2013-12-31 DIAGNOSIS — M999 Biomechanical lesion, unspecified: Secondary | ICD-10-CM

## 2013-12-31 DIAGNOSIS — M5010 Cervical disc disorder with radiculopathy, unspecified cervical region: Secondary | ICD-10-CM | POA: Diagnosis not present

## 2013-12-31 DIAGNOSIS — Z23 Encounter for immunization: Secondary | ICD-10-CM | POA: Diagnosis not present

## 2013-12-31 DIAGNOSIS — M9903 Segmental and somatic dysfunction of lumbar region: Secondary | ICD-10-CM

## 2013-12-31 NOTE — Assessment & Plan Note (Signed)
Decision today to treat with OMT was based on Physical Exam  After verbal consent patient was treated with HVLA techniques in thoracic, rib and lumbar areas  Patient tolerated the procedure well with improvement in symptoms  Patient given exercises, stretches and lifestyle modifications  See medications in patient instructions if given  Patient will follow up in 3-4 weeks

## 2013-12-31 NOTE — Patient Instructions (Signed)
Good to see you It has been a long time.  Stand on wall with heels, butt, shoulder, and head touching goal of 5 minutes daily.  Focus on back strength if in gym See me again in 3 weeks.

## 2013-12-31 NOTE — Assessment & Plan Note (Signed)
Patient does have acute thoracic back pain overall. I do think that this is acute on chronic flare. Patient does have small disc protrusions with no true nerve root impingement previously but did respond very well to epidural steroid injections. Patient did respond well to osteopathic manipulation as well today. Patient was given different postural exercises that I think will be helpful. We also discussed continuing the exercises given to him previously. Patient showed proper technique. We also discussed icing protocol. Discussed over-the-counter medications as well. Patient come back again in 3 weeks for further evaluation and treatment.

## 2013-12-31 NOTE — Progress Notes (Signed)
CC: Back and neck pain followup  HPI: Patient is a 33 year old gentleman who is coming back for MRI results of his back pain. Patient states that he is doing about the same. Patient is having upper thoracic pain and neck pain with some mild radiculopathy into the right arm.  Patient had MRI of the cervical and thoracic spine done. Patient does have a broad based disc osteophyte complex with spurring worse on the right at T7-T8 that is giving mild right foraminal narrowing that could be consistent with patient's pain.  Patient states it has not stopped him from any of his daily activities but continues to be a dull aching sensation.  Patient was actually scheduled to have an epidural steroid injection greater than 5 months ago. Patient canceled this. Patient was also referred for a second opinion by neurosurgery. He did see neurosurgery and they didn't also state that an epidural steroid injection likely would be helpful. Patient had one in July and had been feeling significantly better. Over the course of the last several weeks he has started having increasing discomfort again. It is minimal. Patient states that it is more of a dull aching sensation that is still not stopping him from daily activities. Patient did have some response to osteopathic manipulation previously and would like to continue this.  Past medical, surgical, family and social history reviewed. Medications reviewed all in the electronic medical record.   Review of Systems: No headache, visual changes, nausea, vomiting, diarrhea, constipation, dizziness, abdominal pain, skin rash, fevers, chills, night sweats, weight loss, swollen lymph nodes, body aches, joint swelling, muscle aches, chest pain, shortness of breath, mood changes.   Objective:    Blood pressure 122/82, pulse 84, height 5\' 5"  (1.651 m), weight 230 lb (104.327 kg), SpO2 98.00%.   General: No apparent distress alert and oriented x3 mood and affect normal, dressed  appropriately.  HEENT: Pupils equal, extraocular movements intact Respiratory: Patient's speak in full sentences and does not appear short of breath Cardiovascular: No lower extremity edema, non tender, no erythema Skin: Warm dry intact with no signs of infection or rash on extremities or on axial skeleton. Abdomen: Soft nontender Neuro: Cranial nerves II through XII are intact, neurovascularly intact in all extremities with 2+ DTRs and 2+ pulses. Lymph: No lymphadenopathy of posterior or anterior cervical chain or axillae bilaterally.  Gait normal with good balance and coordination.  MSK: Non tender with full range of motion and good stability and symmetric strength and tone of shoulders, elbows, wrist, hip, knee and ankles bilaterally.    Neck: Inspection unremarkable. Mild increase of kyphosis at the T1. No palpable stepoffs. Negative Spurling's test which is better than previous exam Full neck range of motion Grip strength and sensation normal in bilateral hands Strength good C4 to T1 distribution No sensory change to C4 to T1 Negative Hoffman sign bilaterally Reflexes normal Back Exam:  Inspection: Unremarkable  Motion: Flexion 45 deg, Extension 45 deg, Side Bending to 45 deg bilaterally,  Rotation to 45 deg bilaterally  SLR laying: Negative  XSLR laying: Negative  Palpable tenderness: Continues tenderness over the thoracic spine. FABER: negative. Sensory change: Gross sensation intact to all lumbar and sacral dermatomes.  Reflexes: 2+ at both patellar tendons, 2+ at achilles tendons, Babinski's downgoing.  Strength at foot  Plantar-flexion: 5/5 Dorsi-flexion: 5/5 Eversion: 5/5 Inversion: 5/5  Leg strength  Quad: 5/5 Hamstring: 5/5 Hip flexor: 5/5 Hip abductors: 5/5  Gait unremarkable.   Osteopathic findings T3 extended rotated and side  bent right T5 extended rotated and side bent left with inhaled fifth rib T8 extended rotated and side bent right  Lumbar L2 flexed  rotated and side bent right  Impression and Recommendations:     This case required medical decision making of moderate complexity.

## 2014-01-06 ENCOUNTER — Other Ambulatory Visit: Payer: Self-pay | Admitting: Internal Medicine

## 2014-01-13 DIAGNOSIS — M5412 Radiculopathy, cervical region: Secondary | ICD-10-CM | POA: Diagnosis not present

## 2014-01-17 ENCOUNTER — Ambulatory Visit: Payer: Self-pay | Admitting: Family Medicine

## 2014-01-20 DIAGNOSIS — M5412 Radiculopathy, cervical region: Secondary | ICD-10-CM | POA: Diagnosis not present

## 2014-01-20 DIAGNOSIS — M5032 Other cervical disc degeneration, mid-cervical region: Secondary | ICD-10-CM | POA: Diagnosis not present

## 2014-01-21 ENCOUNTER — Ambulatory Visit (INDEPENDENT_AMBULATORY_CARE_PROVIDER_SITE_OTHER): Payer: Medicare Other | Admitting: Family Medicine

## 2014-01-21 ENCOUNTER — Encounter: Payer: Self-pay | Admitting: Family Medicine

## 2014-01-21 VITALS — BP 112/64 | HR 91 | Ht 65.0 in | Wt 234.0 lb

## 2014-01-21 DIAGNOSIS — M62830 Muscle spasm of back: Secondary | ICD-10-CM | POA: Diagnosis not present

## 2014-01-21 DIAGNOSIS — M9908 Segmental and somatic dysfunction of rib cage: Secondary | ICD-10-CM | POA: Diagnosis not present

## 2014-01-21 DIAGNOSIS — M9902 Segmental and somatic dysfunction of thoracic region: Secondary | ICD-10-CM

## 2014-01-21 DIAGNOSIS — M9903 Segmental and somatic dysfunction of lumbar region: Secondary | ICD-10-CM | POA: Diagnosis not present

## 2014-01-21 DIAGNOSIS — M999 Biomechanical lesion, unspecified: Secondary | ICD-10-CM

## 2014-01-21 NOTE — Assessment & Plan Note (Signed)
Patient is responding very well at this time. Patient will continue with the conservative therapy. We will continue to do and icing regimen as well as a home exercises. Patient was given new postural exercises that I think will be helpful. We'll make no changes in his medication today. Patient's wish showed proper technique which did take some time. Patient will follow-up again in 3-4 weeks.  Spent greater than 25 minutes with patient face-to-face and had greater than 50% of counseling including as described above in assessment and plan.

## 2014-01-21 NOTE — Assessment & Plan Note (Signed)
Decision today to treat with OMT was based on Physical Exam  After verbal consent patient was treated with HVLA techniques in thoracic, rib and lumbar areas  Patient tolerated the procedure well with improvement in symptoms  Patient given exercises, stretches and lifestyle modifications  See medications in patient instructions if given  Patient will follow up in 4 weeks

## 2014-01-21 NOTE — Patient Instructions (Signed)
You are doing good Try to do exercises a at least 3 times a week Continue the vitamins  See me again in 4 weeks.

## 2014-01-21 NOTE — Progress Notes (Signed)
  CC: Back and neck pain followup  HPI:  Patient though now has responded very well to osteopathic manipulation. Patient was seen 3 weeks ago and was given more postural exercises. Patient states as long as he doesn't exercises well he does not have any significant pain. Patient though for the last week has not been doing the exercises and has noticed more worsening pain. Denies any significant radiation into his arms at this time.states that he is resting fairly comfortably at night. He is doing some the vitamins which suggested previously.  Patient had MRI of the cervical and thoracic spine done. Patient does have a broad based disc osteophyte complex with spurring worse on the right at T7-T8 that is giving mild right foraminal narrowing that could be consistent with patient's pain. Patient did undergo an epidural steroid injection previously quite some time ago.  Past medical, surgical, family and social history reviewed. Medications reviewed all in the electronic medical record.   Review of Systems: No headache, visual changes, nausea, vomiting, diarrhea, constipation, dizziness, abdominal pain, skin rash, fevers, chills, night sweats, weight loss, swollen lymph nodes, body aches, joint swelling, muscle aches, chest pain, shortness of breath, mood changes.   Objective:    Blood pressure 112/64, pulse 91, height 5\' 5"  (1.651 m), weight 234 lb (106.142 kg), SpO2 97 %.   General: No apparent distress alert and oriented x3 mood and affect normal, dressed appropriately.  HEENT: Pupils equal, extraocular movements intact Respiratory: Patient's speak in full sentences and does not appear short of breath Cardiovascular: No lower extremity edema, non tender, no erythema Skin: Warm dry intact with no signs of infection or rash on extremities or on axial skeleton. Abdomen: Soft nontender Neuro: Cranial nerves II through XII are intact, neurovascularly intact in all extremities with 2+ DTRs and 2+  pulses. Lymph: No lymphadenopathy of posterior or anterior cervical chain or axillae bilaterally.  Gait normal with good balance and coordination.  MSK: Non tender with full range of motion and good stability and symmetric strength and tone of shoulders, elbows, wrist, hip, knee and ankles bilaterally.    Neck: Inspection unremarkable. Mild increase of kyphosis at the T1. No palpable stepoffs. Negative Spurling's test which is better than previous exam Full neck range of motion Grip strength and sensation normal in bilateral hands Strength good C4 to T1 distribution No sensory change to C4 to T1 Negative Hoffman sign bilaterally Reflexes normal Back Exam:  Inspection: Unremarkable  Motion: Flexion 35 deg, Extension 45 deg, Side Bending to 45 deg bilaterally,  Rotation to 45 deg bilaterally  SLR laying: Negative  XSLR laying: Negative  Palpable tenderness: Continues tenderness over the thoracic spine. FABER: negative. Sensory change: Gross sensation intact to all lumbar and sacral dermatomes.  Reflexes: 2+ at both patellar tendons, 2+ at achilles tendons, Babinski's downgoing.  Strength at foot  Plantar-flexion: 5/5 Dorsi-flexion: 5/5 Eversion: 5/5 Inversion: 5/5  Leg strength  Quad: 5/5 Hamstring: 5/5 Hip flexor: 5/5 Hip abductors: 4/5  Gait unremarkable.   Osteopathic findings T3 extended rotated and side bent right T5 extended rotated and side bent left with inhaled fifth rib T10 extended rotated and side bent right  Lumbar L2 flexed rotated and side bent right  Impression and Recommendations:     This case required medical decision making of moderate complexity.

## 2014-01-22 ENCOUNTER — Ambulatory Visit: Payer: Self-pay | Admitting: Internal Medicine

## 2014-01-26 ENCOUNTER — Ambulatory Visit: Payer: Self-pay | Admitting: Internal Medicine

## 2014-01-26 DIAGNOSIS — R3913 Splitting of urinary stream: Secondary | ICD-10-CM | POA: Diagnosis not present

## 2014-01-26 DIAGNOSIS — R351 Nocturia: Secondary | ICD-10-CM | POA: Diagnosis not present

## 2014-01-26 DIAGNOSIS — N281 Cyst of kidney, acquired: Secondary | ICD-10-CM | POA: Diagnosis not present

## 2014-01-26 DIAGNOSIS — N401 Enlarged prostate with lower urinary tract symptoms: Secondary | ICD-10-CM | POA: Diagnosis not present

## 2014-02-03 ENCOUNTER — Other Ambulatory Visit: Payer: Self-pay | Admitting: Internal Medicine

## 2014-02-16 ENCOUNTER — Encounter: Payer: Self-pay | Admitting: Family Medicine

## 2014-02-16 ENCOUNTER — Ambulatory Visit (INDEPENDENT_AMBULATORY_CARE_PROVIDER_SITE_OTHER): Payer: Medicare Other | Admitting: Family Medicine

## 2014-02-16 VITALS — BP 110/72 | HR 91 | Ht 65.0 in | Wt 230.0 lb

## 2014-02-16 DIAGNOSIS — M9908 Segmental and somatic dysfunction of rib cage: Secondary | ICD-10-CM

## 2014-02-16 DIAGNOSIS — M9903 Segmental and somatic dysfunction of lumbar region: Secondary | ICD-10-CM | POA: Diagnosis not present

## 2014-02-16 DIAGNOSIS — M9902 Segmental and somatic dysfunction of thoracic region: Secondary | ICD-10-CM | POA: Diagnosis not present

## 2014-02-16 DIAGNOSIS — M62830 Muscle spasm of back: Secondary | ICD-10-CM

## 2014-02-16 DIAGNOSIS — M999 Biomechanical lesion, unspecified: Secondary | ICD-10-CM

## 2014-02-16 NOTE — Progress Notes (Signed)
  CC: Back and neck pain followup  HPI:  Patient though now has responded very well to osteopathic manipulation. Patient was seen 3 weeks ago and was given more postural exercises. Patient has not been doing the exercises. Patient overall has continued to improve though. Patient is trying to avoid certain lifting and has been working on his Midwife. Patient is taking the over-the-counter medicines. Denies any numbness, tingling or any new symptoms   PMHx Patient had MRI of the cervical and thoracic spine done. Patient does have a broad based disc osteophyte complex with spurring worse on the right at T7-T8 that is giving mild right foraminal narrowing that could be consistent with patient's pain. Patient did undergo an epidural steroid injection previously quite some time ago.  Past medical, surgical, family and social history reviewed. Medications reviewed all in the electronic medical record.   Review of Systems: No headache, visual changes, nausea, vomiting, diarrhea, constipation, dizziness, abdominal pain, skin rash, fevers, chills, night sweats, weight loss, swollen lymph nodes, body aches, joint swelling, muscle aches, chest pain, shortness of breath, mood changes.   Objective:    Blood pressure 110/72, pulse 91, weight 230 lb (104.327 kg), SpO2 95 %.   General: No apparent distress alert and oriented x3 mood and affect normal, dressed appropriately.  HEENT: Pupils equal, extraocular movements intact Respiratory: Patient's speak in full sentences and does not appear short of breath Cardiovascular: No lower extremity edema, non tender, no erythema Skin: Warm dry intact with no signs of infection or rash on extremities or on axial skeleton. Abdomen: Soft nontender Neuro: Cranial nerves II through XII are intact, neurovascularly intact in all extremities with 2+ DTRs and 2+ pulses. Lymph: No lymphadenopathy of posterior or anterior cervical chain or axillae bilaterally.  Gait  normal with good balance and coordination.  MSK: Non tender with full range of motion and good stability and symmetric strength and tone of shoulders, elbows, wrist, hip, knee and ankles bilaterally.    Neck: Inspection unremarkable. Mild increase of kyphosis at the T1. No palpable stepoffs. Negative Spurling's test  Full neck range of motion Grip strength and sensation normal in bilateral hands Strength good C4 to T1 distribution No sensory change to C4 to T1 Negative Hoffman sign bilaterally Reflexes normal Back Exam:  Inspection: Unremarkable  Motion: Flexion 35 deg, Extension 45 deg, Side Bending to 45 deg bilaterally,  Rotation to 45 deg bilaterally  SLR laying: Negative  XSLR laying: Negative  Palpable tenderness: Continues tenderness over the thoracic spine. FABER: negative. Sensory change: Gross sensation intact to all lumbar and sacral dermatomes.  Reflexes: 2+ at both patellar tendons, 2+ at achilles tendons, Babinski's downgoing.  Strength at foot  Plantar-flexion: 5/5 Dorsi-flexion: 5/5 Eversion: 5/5 Inversion: 5/5  Leg strength  Quad: 5/5 Hamstring: 5/5 Hip flexor: 5/5 Hip abductors: 4/5  Gait unremarkable.   Osteopathic findings T3 extended rotated and side bent right T7 extended rotated and side bent left with inhaled fifth rib  Lumbar L2 flexed rotated and side bent right  Impression and Recommendations:     This case required medical decision making of moderate complexity.

## 2014-02-16 NOTE — Assessment & Plan Note (Signed)
Patient's pain seems to be multifactorial. Encourage patient to continue to work on the postural changes and patient does have significant weakness of the core. We discussed how this can be beneficial. Patient will continue with the over-the-counter medications. We discussed the scapular dyskinesia that can also be contributing. Patient has not tried a make these changes. Patient and will come back and see me again in 3-4 weeks for further evaluation and treatment.

## 2014-02-16 NOTE — Patient Instructions (Addendum)
Good to see you Ice is your friend Continue the posture on the wall.  Try the resistance band exercises You are doing better See me in 5 weeks.

## 2014-02-16 NOTE — Assessment & Plan Note (Signed)
Decision today to treat with OMT was based on Physical Exam  After verbal consent patient was treated with HVLA techniques in thoracic, rib and lumbar areas  Patient tolerated the procedure well with improvement in symptoms  Patient given exercises, stretches and lifestyle modifications  See medications in patient instructions if given  Patient will follow up in 4 weeks

## 2014-02-27 ENCOUNTER — Other Ambulatory Visit (HOSPITAL_COMMUNITY): Payer: Self-pay | Admitting: Psychiatry

## 2014-03-12 ENCOUNTER — Encounter (HOSPITAL_COMMUNITY): Payer: Self-pay | Admitting: Psychiatry

## 2014-03-12 ENCOUNTER — Ambulatory Visit (HOSPITAL_COMMUNITY): Payer: Self-pay | Admitting: Psychiatry

## 2014-03-12 ENCOUNTER — Ambulatory Visit (INDEPENDENT_AMBULATORY_CARE_PROVIDER_SITE_OTHER): Payer: Medicare Other | Admitting: Psychiatry

## 2014-03-12 VITALS — BP 120/72 | HR 85 | Ht 66.0 in | Wt 228.4 lb

## 2014-03-12 DIAGNOSIS — F419 Anxiety disorder, unspecified: Secondary | ICD-10-CM | POA: Diagnosis not present

## 2014-03-12 DIAGNOSIS — F952 Tourette's disorder: Secondary | ICD-10-CM

## 2014-03-12 DIAGNOSIS — F42 Obsessive-compulsive disorder: Secondary | ICD-10-CM | POA: Diagnosis not present

## 2014-03-12 DIAGNOSIS — F959 Tic disorder, unspecified: Secondary | ICD-10-CM | POA: Diagnosis not present

## 2014-03-12 DIAGNOSIS — F429 Obsessive-compulsive disorder, unspecified: Secondary | ICD-10-CM

## 2014-03-12 MED ORDER — VENLAFAXINE HCL ER 75 MG PO CP24: 75.0000 mg | ORAL_CAPSULE | Freq: Every day | ORAL | Status: DC

## 2014-03-12 MED ORDER — LORAZEPAM 0.5 MG PO TABS: 0.5000 mg | ORAL_TABLET | Freq: Every day | ORAL | Status: DC

## 2014-03-12 MED ORDER — MIRTAZAPINE 15 MG PO TABS: 15.0000 mg | ORAL_TABLET | Freq: Every day | ORAL | Status: DC

## 2014-03-12 MED ORDER — ARIPIPRAZOLE 5 MG PO TABS: 5.0000 mg | ORAL_TABLET | Freq: Every day | ORAL | Status: DC

## 2014-03-12 NOTE — Progress Notes (Signed)
Bruce Ellison Progress Note  Bruce Ellison [id number] 34 y.o.  03/12/2014 3:39 PM  Chief Complaint: Medication management and followup.              History of Present Illness:  Bruce Ellison came for his appointment.  He is taking Abilify 5 mg and denies any convergent or any other side effects.  Overall his anxiety and depression is under control.  He has no other concerns.  He is taking Ativan as needed .  We have given Ativan 0.5 mg twice a day but he is taking 1 tablet only if he is very anxious and nervous.  He sleeping good.  He denies any irritability or any anger.  He has no shakes or tremors.  His OCD symptoms are under control.  He denies any paranoia or any hallucination.  His appetite is okay.  His vitals are stable.  Patient lives with his grandparents and his mother.  Suicidal Ideation: No Plan Formed: No Patient has means to carry out plan: No  Homicidal Ideation: No Plan Formed: No Patient has means to carry out plan: No  Review of Systems: Psychiatric: Agitation: No Hallucination: No Depressed Mood: No Insomnia: No Hypersomnia: No Altered Concentration: No Feels Worthless: No Grandiose Ideas: No Belief In Special Powers: No New/Increased Substance Abuse: No Compulsions: No  Review of Systems  Constitutional: Negative.   Skin: Negative.   Psychiatric/Behavioral: Negative for substance abuse. The patient is nervous/anxious.      Past Medical History  Diagnosis Date  . Anxiety   . OCD (obsessive compulsive disorder)   . Personality disorder     with schizoid features  . Tourette's     per [facility] neuro  . Insomnia   . HTN (hypertension)     borderline  . Obesity   . Allergic rhinitis   . Glucose intolerance (impaired glucose tolerance)   . GERD (gastroesophageal reflux disease)   . Vitamin D deficiency   . Depression   . Sleep apnea    His primary care physician is Dr. Christianne Borrow.   Social history.   Patient was born in [place], [place].  At age 65 his parents got divorced.  He moved the [place] at age 40.  He is living with his mother and grandparents.  Patient father still lives in [place].  Patient has no contact with his father.  Patient has never married.  He has no children.  He has limited social network.  Outpatient Encounter Prescriptions as of 03/12/2014  Medication Sig  . ARIPiprazole (ABILIFY) 5 MG tablet Take 1 tablet (5 mg total) by mouth at bedtime.  [name] desonide (DESOWEN) 0.05 % cream   . loratadine (CLARITIN) 10 MG tablet TAKE 1 TABLET BY MOUTH ONCE DAILY  . LORazepam (ATIVAN) 0.5 MG tablet Take 1 tablet (0.5 mg total) by mouth daily.  . mirtazapine (REMERON) 15 MG tablet Take 1 tablet (15 mg total) by mouth at bedtime.  . Multiple Vitamin (MULTIVITAMIN WITH MINERALS) TABS Take 1 tablet by mouth every morning.  . venlafaxine XR (EFFEXOR-XR) 75 MG 24 hr capsule Take 1 capsule (75 mg total) by mouth daily.  . [DISCONTINUED] ARIPiprazole (ABILIFY) 5 MG tablet TAKE 1 TABLET BY MOUTH AT BEDTIME  . [DISCONTINUED] LORazepam (ATIVAN) 0.5 MG tablet Take 1 tablet (0.5 mg total) by mouth 2 (two) times daily.  . [DISCONTINUED] metroNIDAZOLE (FLAGYL) 500 MG tablet TAKE 1 TABLET BY MOUTH 3 TIMES A DAY  . [DISCONTINUED] mirtazapine (REMERON) 15 MG  tablet TAKE 1 TABLET (15 MG TOTAL) BY MOUTH AT BEDTIME.  . [DISCONTINUED] rifaximin (XIFAXAN) 550 MG TABS tablet Take 1 tablet (550 mg total) by mouth 3 (three) times daily.  . [DISCONTINUED] venlafaxine XR (EFFEXOR-XR) 75 MG 24 hr capsule TAKE 1 CAPSULE (75 MG TOTAL) BY MOUTH DAILY.    Past Psychiatric History/Hospitalization(s): Anxiety: Patient has history of psychiatric illness at age 55.  At that time he was in [place].  He has Tic, anxiety and anger. Bipolar Disorder: No Depression: Yes, he had tried Zoloft, Prozac and recently Luvox. Mania: No Psychosis: Patient has history of paranoia.  In the past he had tried Geodon, Risperdal and Seroquel.  He was seeing psychiatrist at  [place].  In the past he was also to get treatment in [place].  He was seeing psychiatrist in [facility] in [place].  He&amp;#39;s been taking the medication since age 62.  Recently his been seeing Dr. Toy Care who had prescribed her Zoloft, Valium, Klonopin, Xanax, Prozac and Ambien.  We also tried luvox however he developed more irritability. Schizophrenia: No Personality Disorder: No Hospitalization for psychiatric illness: No History of Electroconvulsive Shock Therapy: No Prior Suicide Attempts: No  Physical Exam: Constitutional:  BP 120/72 mmHg  Pulse 85  Ht 5\' 6"  (1.676 m)  Wt 228 lb 6.4 oz (103.602 kg)  BMI 36.88 kg/m2  No results found for this or any previous visit (from the past 2160 hour(s)).  General Appearance: well nourished  Musculoskeletal: Strength & Muscle Tone: within normal limits Gait & Station: normal Patient leans: N/A  Psychiatric: Speech (describe rate, volume, coherence, spontaneity, and abnormalities if any): Slow but clear and coherent.  Thought Process (describe rate, content, abstract reasoning, and computation): Slow but logical and goal-directed.  Associations: Relevant, Intact and Obsessive thoughts.  Thoughts: normal  Mental Status: Orientation: oriented to person and place Mood & Affect: anxiety and Constricted affect. Attention Span & Concentration: Poor  Established Problem, Stable/Improving (1), Review of Last Therapy Session (1) and Review of Medication Regimen & Side Effects (2)  Assessment: Axis I: Obsessive-compulsive disorder, Tourette's syndrome, Tic disorder, anxiety disorder NOS  Axis II: Deferred  Axis III: See medical history Patient Active Problem List   Diagnosis Date Noted  . Nonallopathic lesion-rib cage 12/31/2013  . Nonallopathic lesion of lumbosacral region 12/31/2013  . Sleep difficulties 12/28/2013  . Diarrhea 10/10/2013  . Cervical disc disorder with radiculopathy of cervical region 06/11/2013   . Acute thoracic back pain 05/28/2013  . Muscle spasm of back 03/24/2013  . Nonallopathic lesion of thoracic region 03/24/2013  . Finger wound, simple, open 12/08/2012  . Abdominal distention 12/06/2012  . Sinusitis nasal 06/04/2012  . Hyperglycemia 06/04/2012  . Weight gain 06/04/2012  . Major depressive disorder, recurrent episode, severe, without mention of psychotic behavior 05/28/2012  . Generalized anxiety disorder 05/28/2012  . Varicose veins of lower extremities with other complications 73/53/2992  . OSA (obstructive sleep apnea) 02/14/2011  . Polycythemia 12/14/2010  . Cerumen impaction 06/24/2010  . VITAMIN D DEFICIENCY 12/25/2008  . SEBORRHEIC DERMATITIS 07/23/2008  . FREQUENCY, URINARY 07/23/2008  . CELLULITIS/ABSCESS, FOOT 09/11/2007  . GLUCOSE INTOLERANCE 04/11/2007  . ALLERGIC RHINITIS 04/11/2007  . GERD 04/11/2007  . BACK PAIN 04/11/2007    Axis IV: Moderate  Axis V: 50- 55   Plan:   Patient is doing better on Abilify.  He has no more convulsion and he is comfortable at 5 mg.  He has cut down his Ativan to 0.5 mg  one tablet as needed.  He likes his Effexor and Remeron.  I will continue all his medication .  Recommended to call us back if he has any question or any concern.  I will see him again in 3 months.  Jim Lundin T., MD 03/12/2014

## 2014-03-25 ENCOUNTER — Ambulatory Visit: Payer: Self-pay | Admitting: Family Medicine

## 2014-04-07 ENCOUNTER — Encounter: Payer: Self-pay | Admitting: Family Medicine

## 2014-04-07 ENCOUNTER — Ambulatory Visit (INDEPENDENT_AMBULATORY_CARE_PROVIDER_SITE_OTHER): Payer: Medicare Other | Admitting: Family Medicine

## 2014-04-07 VITALS — BP 124/80 | HR 96 | Ht 65.0 in | Wt 232.0 lb

## 2014-04-07 DIAGNOSIS — M9908 Segmental and somatic dysfunction of rib cage: Secondary | ICD-10-CM | POA: Diagnosis not present

## 2014-04-07 DIAGNOSIS — M9902 Segmental and somatic dysfunction of thoracic region: Secondary | ICD-10-CM

## 2014-04-07 DIAGNOSIS — M999 Biomechanical lesion, unspecified: Secondary | ICD-10-CM

## 2014-04-07 DIAGNOSIS — M9903 Segmental and somatic dysfunction of lumbar region: Secondary | ICD-10-CM | POA: Diagnosis not present

## 2014-04-07 NOTE — Assessment & Plan Note (Signed)
Patient's thoracic back pain is still multifactorial. Patient is doing relatively well with the conservative therapy at this time. Patient hasn't noticed some improvement with the manipulation in the regular massages. I would like to continue patient's duration at approximately 6-8 weeks for evaluation. Encourage patient to continue to conservative therapy and home exercises with some mild postural changes that we discussed.

## 2014-04-07 NOTE — Patient Instructions (Addendum)
Good to see you Ice is your friend Copntinue everything you are doing.  See me again in 6-8 weeks.

## 2014-04-07 NOTE — Progress Notes (Signed)
Pre visit review using our clinic review tool, if applicable. No additional management support is needed unless otherwise documented below in the visit note. 

## 2014-04-07 NOTE — Assessment & Plan Note (Signed)
Decision today to treat with OMT was based on Physical Exam  After verbal consent patient was treated with HVLA techniques in thoracic, rib and lumbar areas  Patient tolerated the procedure well with improvement in symptoms  Patient given exercises, stretches and lifestyle modifications  See medications in patient instructions if given  Patient will follow up in 6-8 weeks

## 2014-04-07 NOTE — Progress Notes (Signed)
  CC: Back and neck pain followup  HPI: Patient states overall he is been doing relatively well. Has not had a massage recently and knows that he is more tight than usual. Denies any radiation into his upper extremities or lower Jimmy's. Not stopping him from any activities. Patient continues to natural supplementations as well as the home exercises. Patient overall thinks he is still doing fairly well.     PMHx Patient had MRI of the cervical and thoracic spine done. Patient does have a broad based disc osteophyte complex with spurring worse on the right at T7-T8 that is giving mild right foraminal narrowing that could be consistent with patient's pain. Patient did undergo an epidural steroid injection previously quite some time ago.  Past medical, surgical, family and social history reviewed. Medications reviewed all in the electronic medical record.   Review of Systems: No headache, visual changes, nausea, vomiting, diarrhea, constipation, dizziness, abdominal pain, skin rash, fevers, chills, night sweats, weight loss, swollen lymph nodes, body aches, joint swelling, muscle aches, chest pain, shortness of breath, mood changes.   Objective:    Blood pressure 124/80, pulse 96, height 5\' 5"  (1.651 m), weight 232 lb (105.235 kg), SpO2 96 %.   General: No apparent distress alert and oriented x3 mood and affect normal, dressed appropriately.  HEENT: Pupils equal, extraocular movements intact Respiratory: Patient's speak in full sentences and does not appear short of breath Cardiovascular: No lower extremity edema, non tender, no erythema Skin: Warm dry intact with no signs of infection or rash on extremities or on axial skeleton. Abdomen: Soft nontender Neuro: Cranial nerves II through XII are intact, neurovascularly intact in all extremities with 2+ DTRs and 2+ pulses. Lymph: No lymphadenopathy of posterior or anterior cervical chain or axillae bilaterally.  Gait normal with good balance and  coordination.  MSK: Non tender with full range of motion and good stability and symmetric strength and tone of shoulders, elbows, wrist, hip, knee and ankles bilaterally.    Neck: Inspection unremarkable. Mild increase of kyphosis at the T1. No palpable stepoffs. Negative Spurling's test  Full neck range of motion Grip strength and sensation normal in bilateral hands Strength good C4 to T1 distribution No sensory change to C4 to T1 Negative Hoffman sign bilaterally Reflexes normal Back Exam:  Inspection: Unremarkable  Motion: Flexion 35 deg, Extension 45 deg, Side Bending to 45 deg bilaterally,  Rotation to 45 deg bilaterally  SLR laying: Negative  XSLR laying: Negative  Palpable tenderness: Decreased tenderness but still increased muscle spasm. FABER: negative. Sensory change: Gross sensation intact to all lumbar and sacral dermatomes.  Reflexes: 2+ at both patellar tendons, 2+ at achilles tendons, Babinski's downgoing.  Strength at foot  Plantar-flexion: 5/5 Dorsi-flexion: 5/5 Eversion: 5/5 Inversion: 5/5  Leg strength  Quad: 5/5 Hamstring: 5/5 Hip flexor: 5/5 Hip abductors: 4/5  Gait unremarkable.   Osteopathic findings Cervical C4 flexed rotated and side bent left Thoracic T3 extended rotated and side bent right T7 extended rotated and side bent left   Lumbar L2 flexed rotated and side bent right  Impression and Recommendations:     This case required medical decision making of moderate complexity.

## 2014-04-22 ENCOUNTER — Telehealth: Payer: Self-pay | Admitting: Internal Medicine

## 2014-04-22 DIAGNOSIS — M999 Biomechanical lesion, unspecified: Secondary | ICD-10-CM

## 2014-04-22 NOTE — Telephone Encounter (Signed)
Referral entered  

## 2014-04-22 NOTE — Telephone Encounter (Signed)
Pt referral to go to [place] [facility] [address] [facility] Fax [phone]. Please call pt if this is ok.

## 2014-05-25 DIAGNOSIS — M4722 Other spondylosis with radiculopathy, cervical region: Secondary | ICD-10-CM | POA: Diagnosis not present

## 2014-05-25 DIAGNOSIS — Z6836 Body mass index (BMI) 36.0-36.9, adult: Secondary | ICD-10-CM | POA: Diagnosis not present

## 2014-06-10 DIAGNOSIS — R351 Nocturia: Secondary | ICD-10-CM | POA: Diagnosis not present

## 2014-06-10 DIAGNOSIS — N401 Enlarged prostate with lower urinary tract symptoms: Secondary | ICD-10-CM | POA: Diagnosis not present

## 2014-06-10 DIAGNOSIS — R3913 Splitting of urinary stream: Secondary | ICD-10-CM | POA: Diagnosis not present

## 2014-06-11 ENCOUNTER — Encounter (HOSPITAL_COMMUNITY): Payer: Self-pay | Admitting: Psychiatry

## 2014-06-11 ENCOUNTER — Ambulatory Visit (INDEPENDENT_AMBULATORY_CARE_PROVIDER_SITE_OTHER): Payer: Medicare Other | Admitting: Psychiatry

## 2014-06-11 VITALS — BP 137/69 | HR 84 | Ht 66.0 in | Wt 229.8 lb

## 2014-06-11 DIAGNOSIS — F42 Obsessive-compulsive disorder: Secondary | ICD-10-CM | POA: Diagnosis not present

## 2014-06-11 DIAGNOSIS — F959 Tic disorder, unspecified: Secondary | ICD-10-CM | POA: Diagnosis not present

## 2014-06-11 DIAGNOSIS — F952 Tourette's disorder: Secondary | ICD-10-CM

## 2014-06-11 DIAGNOSIS — F419 Anxiety disorder, unspecified: Secondary | ICD-10-CM | POA: Diagnosis not present

## 2014-06-11 DIAGNOSIS — F429 Obsessive-compulsive disorder, unspecified: Secondary | ICD-10-CM

## 2014-06-11 MED ORDER — MIRTAZAPINE 15 MG PO TABS: 15.0000 mg | ORAL_TABLET | Freq: Every day | ORAL | Status: DC

## 2014-06-11 MED ORDER — VENLAFAXINE HCL ER 75 MG PO CP24: 75.0000 mg | ORAL_CAPSULE | Freq: Every day | ORAL | Status: DC

## 2014-06-11 MED ORDER — ARIPIPRAZOLE 5 MG PO TABS: 5.0000 mg | ORAL_TABLET | Freq: Every day | ORAL | Status: DC

## 2014-06-11 NOTE — Progress Notes (Signed)
[facility] Progress Note  Bruce Ellison [id number] 34 y.o.  06/11/2014 3:45 PM  Chief Complaint: Medication management and follow-up.    History of Present Illness:  Bruce Ellison came for his appointment.  He is no lo taking Ativan.  He feels his anxiety is under control.  He denies any major panic attack.  He recently seen a neurologist and he is taking Flomax.  He endorses OCD symptoms are under control.  He sleeping good.  He denies any irritability, anger, mood swing.  He denies any feeling of hopelessness or worthlessness.  He denies any paranoia or any hallucination.  His appetite is okay.  His vitals are stable.  Patient lives with his grandparents and his mother.  Patient denies drinking or using any illegal substances.   Suicidal Ideation: No Plan Formed: No Patient has means to carry out plan: No  Homicidal Ideation: No Plan Formed: No Patient has means to carry out plan: No  Review of Systems: Psychiatric: Agitation: No Hallucination: No Depressed Mood: No Insomnia: No Hypersomnia: No Altered Concentration: No Feels Worthless: No Grandiose Ideas: No Belief In Special Powers: No New/Increased Substance Abuse: No Compulsions: No  ROS   Past Medical History  Diagnosis Date  . Anxiety   . OCD (obsessive compulsive disorder)   . Personality disorder     with schizoid features  . Tourette's     per [facility]  . Insomnia   . HTN (hypertension)     borderline  . Obesity   . Allergic rhinitis   . Glucose intolerance (impaired glucose tolerance)   . GERD (gastroesophageal reflux disease)   . Vitamin D deficiency   . Depression   . Sleep apnea    His primary care physician is Dr. Christianne Borrow.   Social history.   Patient was born in [place], [place].  At age 57 his parents got divorced.  He moved the [place] at age 13.  He is living with his mother and grandparents.  Patient father still lives in [place].  Patient has no contact with his father.   Patient has never married.  He has no children.  He has limited social network.  Outpatient Encounter Prescriptions as of 06/11/2014  Medication Sig  . ARIPiprazole (ABILIFY) 5 MG tablet Take 1 tablet (5 mg total) by mouth at bedtime.  [name] desonide (DESOWEN) 0.05 % cream   . loratadine (CLARITIN) 10 MG tablet TAKE 1 TABLET BY MOUTH ONCE DAILY  . mirtazapine (REMERON) 15 MG tablet Take 1 tablet (15 mg total) by mouth at bedtime.  . Multiple Vitamin (MULTIVITAMIN WITH MINERALS) TABS Take 1 tablet by mouth every morning.  . tamsulosin (FLOMAX) 0.4 MG CAPS capsule   . venlafaxine XR (EFFEXOR-XR) 75 MG 24 hr capsule Take 1 capsule (75 mg total) by mouth daily.  . [DISCONTINUED] ARIPiprazole (ABILIFY) 5 MG tablet Take 1 tablet (5 mg total) by mouth at bedtime.  . [DISCONTINUED] LORazepam (ATIVAN) 0.5 MG tablet Take 1 tablet (0.5 mg total) by mouth daily.  . [DISCONTINUED] mirtazapine (REMERON) 15 MG tablet Take 1 tablet (15 mg total) by mouth at bedtime.  . [DISCONTINUED] venlafaxine XR (EFFEXOR-XR) 75 MG 24 hr capsule Take 1 capsule (75 mg total) by mouth daily.    Past Psychiatric History/Hospitalization(s): Anxiety: Patient has history of psychiatric illness at age 89.  At that time he was in [place].  He has Tic, anxiety and anger. Bipolar Disorder: No Depression: Yes, he had tried Zoloft, Prozac and recently  Luvox. Mania: No Psychosis: Patient has history of paranoia.  In the past he had tried Geodon, Risperdal and Seroquel.  He was seeing psychiatrist at [facility].  In the past he was also to get treatment in [place].  He was seeing psychiatrist in [place] in [place].  He&amp;#39;s been taking the medication since age 31.  Recently his been seeing Dr. Toy Care who had prescribed her Zoloft, Valium, Klonopin, Xanax, Prozac and Ambien.  We also tried luvox however he developed more irritability. Schizophrenia: No Personality Disorder: No Hospitalization for psychiatric illness:  No History of Electroconvulsive Shock Therapy: No Prior Suicide Attempts: No  Physical Exam: Constitutional:  BP 137/69 mmHg  Pulse 84  Ht 5\' 6"  (1.676 m)  Wt 229 lb 12.8 oz (104.237 kg)  BMI 37.11 kg/m2  No results found for this or any previous visit (from the past 2160 hour(s)).  General Appearance: well nourished  Musculoskeletal: Strength & Muscle Tone: within normal limits Gait & Station: normal Patient leans: N/A  Psychiatric: Speech (describe rate, volume, coherence, spontaneity, and abnormalities if any): Slow but clear and coherent.  Thought Process (describe rate, content, abstract reasoning, and computation): Slow but logical and goal-directed.  Associations: Relevant, Intact and Obsessive thoughts.  Thoughts: normal  Mental Status: Orientation: oriented to person and place Mood & Affect: anxiety and Constricted affect. Attention Span & Concentration: Poor  Established Problem, Stable/Improving (1), Review of Last Therapy Session (1) and Review of Medication Regimen & Side Effects (2)  Assessment: Axis I: Obsessive-compulsive disorder, Tourette's syndrome, Tic disorder, anxiety disorder NOS  Axis II: Deferred  Axis III: See medical history Patient Active Problem List   Diagnosis Date Noted  . Nonallopathic lesion-rib cage 12/31/2013  . Nonallopathic lesion of lumbosacral region 12/31/2013  . Sleep difficulties 12/28/2013  . Diarrhea 10/10/2013  . Cervical disc disorder with radiculopathy of cervical region 06/11/2013  . Acute thoracic back pain 05/28/2013  . Muscle spasm of back 03/24/2013  . Nonallopathic lesion of thoracic region 03/24/2013  . Finger wound, simple, open 12/08/2012  . Abdominal distention 12/06/2012  . Sinusitis nasal 06/04/2012  . Hyperglycemia 06/04/2012  . Weight gain 06/04/2012  . Major depressive disorder, recurrent episode, severe, without mention of psychotic behavior 05/28/2012  . Generalized anxiety disorder 05/28/2012   . Varicose veins of lower extremities with other complications 02/22/7587  . OSA (obstructive sleep apnea) 02/14/2011  . Polycythemia 12/14/2010  . Cerumen impaction 06/24/2010  . VITAMIN D DEFICIENCY 12/25/2008  . SEBORRHEIC DERMATITIS 07/23/2008  . FREQUENCY, URINARY 07/23/2008  . CELLULITIS/ABSCESS, FOOT 09/11/2007  . GLUCOSE INTOLERANCE 04/11/2007  . ALLERGIC RHINITIS 04/11/2007  . GERD 04/11/2007  . BACK PAIN 04/11/2007    Plan:   Patient is stable on his current medication.  He wants to continue Abilify 5 mg daily, Remeron 15 mg at bedtime and Effexor 75 mg daily.  I will discontinue Ativan since he is not taking it.  Recommended to call us back if he has any question, concern.  He is worsening of the symptom.  Follow-up in 3 months.  Jakerra Floyd T., MD 06/11/2014

## 2014-06-17 ENCOUNTER — Ambulatory Visit (INDEPENDENT_AMBULATORY_CARE_PROVIDER_SITE_OTHER): Payer: Medicare Other | Admitting: Family Medicine

## 2014-06-17 ENCOUNTER — Encounter: Payer: Self-pay | Admitting: Family Medicine

## 2014-06-17 VITALS — BP 122/82 | HR 91 | Ht 65.0 in | Wt 228.0 lb

## 2014-06-17 DIAGNOSIS — M549 Dorsalgia, unspecified: Secondary | ICD-10-CM | POA: Diagnosis not present

## 2014-06-17 DIAGNOSIS — M546 Pain in thoracic spine: Secondary | ICD-10-CM

## 2014-06-17 DIAGNOSIS — M9908 Segmental and somatic dysfunction of rib cage: Secondary | ICD-10-CM | POA: Diagnosis not present

## 2014-06-17 DIAGNOSIS — M999 Biomechanical lesion, unspecified: Secondary | ICD-10-CM

## 2014-06-17 NOTE — Progress Notes (Signed)
Pre visit review using our clinic review tool, if applicable. No additional management support is needed unless otherwise documented below in the visit note. 

## 2014-06-17 NOTE — Progress Notes (Signed)
  CC: Back and neck pain followup  HPI: Patient states overall he is been doing relatively well. Has not had a massage recently and knows that he is more tight than usual. Denies any radiation into his upper extremities or lower extremity. Patient continues to be able to do daily activities without any significant discomfort. Patient is able to continue home exercises fairly regularly. Some mild soreness but nothing that his stopping him from any activities. Patient has not taken any pain medications and is no longer seen a chiropractor. Patient is very happy with the results so far.     PMHx Patient had MRI of the cervical and thoracic spine done. Patient does have a broad based disc osteophyte complex with spurring worse on the right at T7-T8 that is giving mild right foraminal narrowing that could be consistent with patient's pain. Patient did undergo an epidural steroid injection previously quite some time ago. Has not needed a repeat for quite some time.  Past medical, surgical, family and social history reviewed. Medications reviewed all in the electronic medical record.   Review of Systems: No headache, visual changes, nausea, vomiting, diarrhea, constipation, dizziness, abdominal pain, skin rash, fevers, chills, night sweats, weight loss, swollen lymph nodes, body aches, joint swelling, muscle aches, chest pain, shortness of breath, mood changes.   Objective:    Blood pressure 122/82, pulse 91, height 5\' 5"  (1.651 m), weight 228 lb (103.42 kg), SpO2 95 %.   General: No apparent distress alert and oriented x3 mood and affect normal, dressed appropriately.  HEENT: Pupils equal, extraocular movements intact Respiratory: Patient's speak in full sentences and does not appear short of breath Cardiovascular: No lower extremity edema, non tender, no erythema Skin: Warm dry intact with no signs of infection or rash on extremities or on axial skeleton. Abdomen: Soft nontender Neuro: Cranial  nerves II through XII are intact, neurovascularly intact in all extremities with 2+ DTRs and 2+ pulses. Lymph: No lymphadenopathy of posterior or anterior cervical chain or axillae bilaterally.  Gait normal with good balance and coordination.  MSK: Non tender with full range of motion and good stability and symmetric strength and tone of shoulders, elbows, wrist, hip, knee and ankles bilaterally.    Neck: Inspection unremarkable. Mild increase of kyphosis at the T1. No palpable stepoffs. Negative Spurling's test  Full neck range of motion Grip strength and sensation normal in bilateral hands Strength good C4 to T1 distribution No sensory change to C4 to T1 Negative Hoffman sign bilaterally Reflexes normal Back Exam:  Inspection: Unremarkable  Motion: Flexion 35 deg, Extension 45 deg, Side Bending to 45 deg bilaterally,  Rotation to 45 deg bilaterally  SLR laying: Negative  XSLR laying: Negative  Palpable tenderness:no muscle spasm but moderate pain still in thoracic. FABER: negative. Sensory change: Gross sensation intact to all lumbar and sacral dermatomes.  Reflexes: 2+ at both patellar tendons, 2+ at achilles tendons, Babinski's downgoing.  Strength at foot  Plantar-flexion: 5/5 Dorsi-flexion: 5/5 Eversion: 5/5 Inversion: 5/5  Leg strength  Quad: 5/5 Hamstring: 5/5 Hip flexor: 5/5 Hip abductors: 4/5  Gait unremarkable.   Osteopathic findings Cervical C4 flexed rotated and side bent left Thoracic T3 extended rotated and side bent right inhaled third rib T7 extended rotated and side bent left   Lumbar L2 flexed rotated and side bent right Sacrum Right on right  Impression and Recommendations:     This case required medical decision making of moderate complexity.

## 2014-06-17 NOTE — Assessment & Plan Note (Signed)
Decision today to treat with OMT was based on Physical Exam  After verbal consent patient was treated with HVLA techniques in thoracic, rib and lumbar and sacral areas  Patient tolerated the procedure well with improvement in symptoms  Patient given exercises, stretches and lifestyle modifications  See medications in patient instructions if given  Patient will follow up in 6-8 weeks

## 2014-06-17 NOTE — Patient Instructions (Signed)
Good ot see you Bruce Ellison is your friend when you need it Continue the vitamins You are dong great See me again in 2-3 months

## 2014-06-17 NOTE — Assessment & Plan Note (Signed)
Thoracic back pain overall seems to be doing better. We discussed icing regimen and home exercises. We discussed postural changes that could be beneficial and ergonomic changes when patient is sitting for long amount of time. Patient will make these changes. His lungs patient continues to do well follow-up every 8-12 weeks for further evaluation and treatment.

## 2014-07-17 DIAGNOSIS — H5213 Myopia, bilateral: Secondary | ICD-10-CM | POA: Diagnosis not present

## 2014-07-17 DIAGNOSIS — H40053 Ocular hypertension, bilateral: Secondary | ICD-10-CM | POA: Diagnosis not present

## 2014-07-22 ENCOUNTER — Telehealth: Payer: Self-pay | Admitting: *Deleted

## 2014-07-22 NOTE — Telephone Encounter (Signed)
Ok - pls give him a note Thx

## 2014-07-22 NOTE — Telephone Encounter (Signed)
Pt called to check up on this and request it to be mail to his home address.

## 2014-07-22 NOTE — Telephone Encounter (Signed)
Left msg on triage stating wanting md to write a note for school stating that he need to be the first one to take test on June 10th due to his anxiety issues...Bruce Ellison

## 2014-07-22 NOTE — Telephone Encounter (Signed)
Generated note mail to pt address...Bruce Ellison

## 2014-08-07 ENCOUNTER — Telehealth: Payer: Self-pay | Admitting: Internal Medicine

## 2014-08-07 NOTE — Telephone Encounter (Signed)
Spoke with patient and he is having stomach spasms and diarrhea x 3 days. Denies bleeding,fever, travel or recent antibiotics. Scheduled with Lori Hvozdovic, PA-C on 6/7/126 at 2:15 PM.

## 2014-08-10 ENCOUNTER — Encounter: Payer: Self-pay | Admitting: Gastroenterology

## 2014-08-11 ENCOUNTER — Other Ambulatory Visit: Payer: Medicare Other

## 2014-08-11 ENCOUNTER — Encounter: Payer: Self-pay | Admitting: Physician Assistant

## 2014-08-11 ENCOUNTER — Ambulatory Visit (INDEPENDENT_AMBULATORY_CARE_PROVIDER_SITE_OTHER): Payer: Medicare Other | Admitting: Physician Assistant

## 2014-08-11 VITALS — BP 116/72 | HR 68 | Ht 66.0 in | Wt 203.0 lb

## 2014-08-11 DIAGNOSIS — R197 Diarrhea, unspecified: Secondary | ICD-10-CM | POA: Diagnosis not present

## 2014-08-11 DIAGNOSIS — K589 Irritable bowel syndrome without diarrhea: Secondary | ICD-10-CM

## 2014-08-11 MED ORDER — RIFAXIMIN 550 MG PO TABS: ORAL_TABLET | ORAL | Status: DC

## 2014-08-11 MED ORDER — DICYCLOMINE HCL 10 MG PO CAPS: 10.0000 mg | ORAL_CAPSULE | Freq: Two times a day (BID) | ORAL | Status: DC

## 2014-08-11 NOTE — Patient Instructions (Signed)
Prescriptions have been sent to your pharmacy  You are scheduled for a follow up with Dr Hilarie Fredrickson on 10/06/2014 at 2pm  Go to the basement for labs today

## 2014-08-11 NOTE — Progress Notes (Addendum)
Patient ID: Bruce Ellison, male   DOB: Mar 29, 1980, 34 y.o.   MRN: [id number]     History of Present Illness: Bruce Ellison is a pleasant 34 year old male known to Dr. Hilarie Fredrickson. He has a past medical history of OCD, ADD, Tourette's, GERD, anxiety and depression, and impaired glucose tolerance. Due to complaints of long-standing diarrhea, he underwent a colonoscopy on 11/13/2013. It was normal mucosa in the terminal ileum. Diminutive sessile polyp measuring 3 mm in size was found in the sigmoid colon, polypectomy was performed with cold forceps. The colon mucosa was otherwise normal. Pathology revealed a tubular adenoma with no high-grade dysplasia or malignancy identified. Random biopsies showed benign colonic mucosa with no significant inflammation, granulomata or adenomatous epithelium identified. Patient was advised to have surveillance colonoscopy in September 2020.  Veto returns today due to complaints of ongoing diarrhea. He feels his diarrhea has actually gotten worse in the past couple of weeks. He states last week he had frequent postprandial cramping relieved with passage of gas or defecation. This week he has no cramping but he is having 6-8 mushy bowel movements daily. He has no nocturnal stooling. His stools are not oily but they do smells strong. Prior to this exacerbation of his diarrhea he had not had any anti-biotics nor had he traveled outside of the country. He does not have any new pets. He reports that he eats gets crampy lower abdominal pain and has to have one or 2 urgent bowel movements. He has had no bright red blood per rectum or melena. His appetite has been good and his weight has been fairly stable. Celiac studies and stool cultures in the past have been negative.   Past Medical History  Diagnosis Date  . Anxiety   . OCD (obsessive compulsive disorder)   . Personality disorder     with schizoid features  . Tourette's     per [facility]  . Insomnia   . HTN (hypertension)    borderline  . Obesity   . Allergic rhinitis   . Glucose intolerance (impaired glucose tolerance)   . GERD (gastroesophageal reflux disease)   . Vitamin D deficiency   . Depression   . Sleep apnea   . Adenomatous colon polyp     tubular    Past Surgical History  Procedure Laterality Date  . Adnoidectomy  1992   Family History  Problem Relation Age of Onset  . Cholelithiasis Maternal Grandmother   . Diabetes Maternal Grandmother   . Prostate cancer Maternal Grandfather   . Colon polyps Maternal Grandfather   . Depression Mother   . Bipolar disorder Father   . Esophageal cancer Neg Hx   . Rectal cancer Neg Hx   . Stomach cancer Neg Hx   . Colon cancer Other    History  Substance Use Topics  . Smoking status: Never Smoker   . Smokeless tobacco: Never Used  . Alcohol Use: No   Current Outpatient Prescriptions  Medication Sig Dispense Refill  . mirtazapine (REMERON) 15 MG tablet Take 1 tablet (15 mg total) by mouth at bedtime. 30 tablet 2  . Multiple Vitamin (MULTIVITAMIN WITH MINERALS) TABS Take 1 tablet by mouth every morning.    . venlafaxine XR (EFFEXOR-XR) 75 MG 24 hr capsule Take 1 capsule (75 mg total) by mouth daily. 30 capsule 2  . ARIPiprazole (ABILIFY) 5 MG tablet Take 1 tablet (5 mg total) by mouth at bedtime. 30 tablet 2  . desonide (DESOWEN) 0.05 % cream     .  dicyclomine (BENTYL) 10 MG capsule Take 1 capsule (10 mg total) by mouth 2 (two) times daily before a meal. 60 capsule 1  . rifaximin (XIFAXAN) 550 MG TABS tablet Use one by mouth three times a day for 14 days 42 tablet 0   No current facility-administered medications for this visit.   Allergies  Allergen Reactions  . Honey Bee Treatment [Bee Venom] Anaphylaxis, Hives and Rash    Pt reports he does not go into anaphylaxis with bee stings, just honey  . Doxycycline   . Quetiapine Other (See Comments)    restlessness  . Penicillins Hives and Rash  . Sulfa Antibiotics Hives and Rash      Review  of Systems: Per history of present illness otherwise negative  Colonoscopies: See history of present illness  Physical Exam: General: Pleasant, well developed male in no acute distress Head: Normocephalic and atraumatic Eyes:  sclerae anicteric, conjunctiva pink  Ears: Normal auditory acuity Lungs: Clear throughout to auscultation Heart: Regular rate and rhythm Abdomen: Soft, non distended, non-tender. No masses, no hepatomegaly. Normal bowel sounds Musculoskeletal: Symmetrical with no gross deformities  Extremities: No edema  Neurological: Alert oriented x 4, grossly nonfocal Psychological:  Alert and cooperative. Normal mood and affect  Assessment and Recommendations: 34 year old male with a 4 year history of diarrhea here for follow-up. Recent colonoscopy was negative for inflammatory bowel disease or microscopic colitis. Celiac blood work in 2012 was negative. His symptoms are likely do to diarrhea predominant IBS. A pancreatic fecal elastase will be obtained, and if his elastase was low he will be given a trial of Creon. If his elastase is normal he will be considered for a trial of Viberzi. In the meantime, he will be even a trial of Xifaxan 550 mg 3 times daily for 14 days for possible small intestinal bacterial overgrowth. He will also be given a trial of Bentyl 20 mg twice a day. He will return in 2 months, sooner if needed.  Anabelle Bungert, Vita Barley PA-C 08/11/2014,  Addendum: Reviewed and agree with initial management. Jerene Bears, MD

## 2014-08-12 ENCOUNTER — Other Ambulatory Visit: Payer: Medicare Other

## 2014-08-12 ENCOUNTER — Telehealth: Payer: Self-pay | Admitting: *Deleted

## 2014-08-12 ENCOUNTER — Telehealth: Payer: Self-pay | Admitting: Physician Assistant

## 2014-08-12 ENCOUNTER — Encounter: Payer: Self-pay | Admitting: *Deleted

## 2014-08-12 DIAGNOSIS — R197 Diarrhea, unspecified: Secondary | ICD-10-CM | POA: Diagnosis not present

## 2014-08-12 DIAGNOSIS — K589 Irritable bowel syndrome without diarrhea: Secondary | ICD-10-CM | POA: Diagnosis not present

## 2014-08-12 MED ORDER — METRONIDAZOLE 250 MG PO TABS: 250.0000 mg | ORAL_TABLET | Freq: Three times a day (TID) | ORAL | Status: DC

## 2014-08-12 NOTE — Telephone Encounter (Signed)
Bruce Ellison, call me about this pt

## 2014-08-12 NOTE — Telephone Encounter (Signed)
Letter faxed to number provided by patient.[phone])

## 2014-08-12 NOTE — Telephone Encounter (Signed)
Spoke with patient and informed him that letter has been faxed. Rx sent to pharmacy for Flagyl 250 mg TID x 10 days. Informed patient no interaction with Bentyl and his other medications. Patient is aware that his other medications interact with each other.

## 2014-08-12 NOTE — Telephone Encounter (Signed)
Pt wants to know if it is safe to take Bentyl with his Psychatiric meds (Effexor, Abilify and Remeron) The Xifaxan is not covered by his Ins. 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

## 2014-08-12 NOTE — Telephone Encounter (Signed)
-----   Message from Vita Barley Hvozdovic, PA-C sent at 08/12/2014 11:38 AM EDT ----- Rollene Fare, patient was in yesterday for his diarrhea predominant IBS. He was prescribed Xifaxan. He dropped often note that says his insurance will not cover it. He can try Flagyl 250 mg 3 times a day 10 days. Make sure he knows no alcohol while on Flagyl. He also had a question about Bentyl interacting with his Effexor Abilify and Remeron. I looked up the drug interaction analysis on up to date. Abilify can interact with Effexor and Remeron. Effexor can interact with Abilify and Remeron, and Remeron can interact with Abilify and Effexor it to levels C interaction. There are no interactions identified between Bentyl and Abilify Bentyl and Effexor or Bentyl and Remeron per up to date. I think if he tries it once a day to start he should be okay. He also left in no Barrett's and he was evaluated here on June 7. 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 He didn't mention that when he was here yesterday he mentioned his diarrhea area we can write and no that says he was here on the seventh for a follow-up for his IBS. You can put in the latter that he has been experiencing frequent diarrhea and is currently on a 10 day treatment. Due to this he will not be able to make his test on June 10 due to his need for frequent urgent bathroom trips. I will drop off the address to you. Thanks

## 2014-08-21 LAB — PANCREATIC ELASTASE, FECAL: Pancreatic Elastase-1, Stool: 382 mcg/g

## 2014-08-26 ENCOUNTER — Encounter: Payer: Self-pay | Admitting: Family Medicine

## 2014-08-26 ENCOUNTER — Ambulatory Visit (INDEPENDENT_AMBULATORY_CARE_PROVIDER_SITE_OTHER): Payer: Medicare Other | Admitting: Family Medicine

## 2014-08-26 VITALS — BP 126/82 | HR 79 | Ht 66.0 in | Wt 202.0 lb

## 2014-08-26 DIAGNOSIS — M9908 Segmental and somatic dysfunction of rib cage: Secondary | ICD-10-CM | POA: Diagnosis not present

## 2014-08-26 DIAGNOSIS — M5010 Cervical disc disorder with radiculopathy, unspecified cervical region: Secondary | ICD-10-CM

## 2014-08-26 DIAGNOSIS — M999 Biomechanical lesion, unspecified: Secondary | ICD-10-CM

## 2014-08-26 DIAGNOSIS — M9902 Segmental and somatic dysfunction of thoracic region: Secondary | ICD-10-CM

## 2014-08-26 DIAGNOSIS — M9903 Segmental and somatic dysfunction of lumbar region: Secondary | ICD-10-CM | POA: Diagnosis not present

## 2014-08-26 NOTE — Progress Notes (Signed)
Pre visit review using our clinic review tool, if applicable. No additional management support is needed unless otherwise documented below in the visit note. 

## 2014-08-26 NOTE — Patient Instructions (Signed)
Good to see you Ice is your friend Continue what you are doing because it is working See me again in 8-10 weeks.

## 2014-08-26 NOTE — Assessment & Plan Note (Signed)
Decision today to treat with OMT was based on Physical Exam  After verbal consent patient was treated with HVLA techniques in thoracic, rib and lumbar and sacral areas  Patient tolerated the procedure well with improvement in symptoms  Patient given exercises, stretches and lifestyle modifications  See medications in patient instructions if given  Patient will follow up in 8 weeks

## 2014-08-26 NOTE — Assessment & Plan Note (Signed)
Patient overall is doing relatively well. Am impressed. Patient continues to do the exercises on a more regular basis. We discussed icing regimen and home exercises. We discussed sleeping position again in greater detail. I believe his lungs patient continues to stay active and in good spirits he is unable very well. Patient will come back and see me again in 2 months for further evaluation and treatment.

## 2014-08-26 NOTE — Progress Notes (Signed)
  CC: Back and neck pain followup  HPI: Patient states overall he is been doing relatively well. Patient overall has been doing relatively well. Patient states that as long as he does he exercises fairly regularly seems to be doing better. She has also noticed that as long as he takes the Effexor on a regular basis he is also been feeling better. Patient denies any radiation to his extremity's. Nothing that is stopping him from activities. Patient states it just more of a dull aching sensation mostly in the mid thoracic spine.     PMHx Patient had MRI of the cervical and thoracic spine done. Patient does have a broad based disc osteophyte complex with spurring worse on the right at T7-T8 that is giving mild right foraminal narrowing that could be consistent with patient's pain. Patient did undergo an epidural steroid injection previously quite some time ago. Has not needed a repeat for quite some time.  Past medical, surgical, family and social history reviewed. Medications reviewed all in the electronic medical record.   Review of Systems: No headache, visual changes, nausea, vomiting, diarrhea, constipation, dizziness, abdominal pain, skin rash, fevers, chills, night sweats, weight loss, swollen lymph nodes, body aches, joint swelling, muscle aches, chest pain, shortness of breath, mood changes.   Objective:    Blood pressure 126/82, pulse 79, height 5\' 6"  (1.676 m), weight 202 lb (91.627 kg), SpO2 95 %.   General: No apparent distress alert and oriented x3 mood and affect normal, dressed appropriately.  HEENT: Pupils equal, extraocular movements intact Respiratory: Patient's speak in full sentences and does not appear short of breath Cardiovascular: No lower extremity edema, non tender, no erythema Skin: Warm dry intact with no signs of infection or rash on extremities or on axial skeleton. Abdomen: Soft nontender Neuro: Cranial nerves II through XII are intact, neurovascularly intact in  all extremities with 2+ DTRs and 2+ pulses. Lymph: No lymphadenopathy of posterior or anterior cervical chain or axillae bilaterally.  Gait normal with good balance and coordination.  MSK: Non tender with full range of motion and good stability and symmetric strength and tone of shoulders, elbows, wrist, hip, knee and ankles bilaterally.    Neck: Inspection unremarkable. Mild increase of kyphosis at the T1. No palpable stepoffs. Negative Spurling's test  Full neck range of motion Grip strength and sensation normal in bilateral hands Strength good C4 to T1 distribution No sensory change to C4 to T1 Negative Hoffman sign bilaterally Reflexes normal Back Exam:  Inspection: Unremarkable  Motion: Flexion 35 deg, Extension 45 deg, Side Bending to 45 deg bilaterally,  Rotation to 45 deg bilaterally  SLR laying: Negative  XSLR laying: Negative  Palpable tenderness:no muscle spasm but moderate pain still in thoracic. FABER: negative. Sensory change: Gross sensation intact to all lumbar and sacral dermatomes.  Reflexes: 2+ at both patellar tendons, 2+ at achilles tendons, Babinski's downgoing.  Strength at foot  Plantar-flexion: 5/5 Dorsi-flexion: 5/5 Eversion: 5/5 Inversion: 5/5  Leg strength  Quad: 5/5 Hamstring: 5/5 Hip flexor: 5/5 Hip abductors: 4/5  Gait unremarkable.   Osteopathic findings Cervical C4 flexed rotated and side bent left Thoracic T3 extended rotated and side bent right inhaled third rib T7 extended rotated and side bent left   Lumbar L2 flexed rotated and side bent right Sacrum Right on right  Impression and Recommendations:     This case required medical decision making of moderate complexity.

## 2014-09-06 ENCOUNTER — Other Ambulatory Visit (HOSPITAL_COMMUNITY): Payer: Self-pay | Admitting: Psychiatry

## 2014-09-06 DIAGNOSIS — F429 Obsessive-compulsive disorder, unspecified: Secondary | ICD-10-CM

## 2014-09-08 NOTE — Telephone Encounter (Signed)
New order for patient's prescribed Abilify 5mg , one at bedtime e-scribed to [facility]'s [facility] on [address] as refill authorized by Dr. Dwyane Dee.  Patient was rescheduled for 09/23/14 after 09/14/14 appointment was rescheduled due to provider being out-of-town.

## 2014-09-10 ENCOUNTER — Other Ambulatory Visit (HOSPITAL_COMMUNITY): Payer: Self-pay | Admitting: Psychiatry

## 2014-09-10 DIAGNOSIS — F429 Obsessive-compulsive disorder, unspecified: Secondary | ICD-10-CM

## 2014-09-10 NOTE — Telephone Encounter (Signed)
One time refill of patient's Effexor and Remeron approved by Dr. Dwyane Dee as patient's appointment from 09/14/14 was rescheduled due to Dr. Adele Schilder being out of town and will now be seen on 09/23/14.

## 2014-09-14 ENCOUNTER — Ambulatory Visit (HOSPITAL_COMMUNITY): Payer: Self-pay | Admitting: Psychiatry

## 2014-09-15 ENCOUNTER — Other Ambulatory Visit (INDEPENDENT_AMBULATORY_CARE_PROVIDER_SITE_OTHER): Payer: Medicare Other

## 2014-09-15 ENCOUNTER — Ambulatory Visit (INDEPENDENT_AMBULATORY_CARE_PROVIDER_SITE_OTHER): Payer: Medicare Other | Admitting: Internal Medicine

## 2014-09-15 ENCOUNTER — Encounter: Payer: Self-pay | Admitting: Internal Medicine

## 2014-09-15 VITALS — BP 110/80 | HR 88 | Wt 232.0 lb

## 2014-09-15 DIAGNOSIS — R635 Abnormal weight gain: Secondary | ICD-10-CM

## 2014-09-15 DIAGNOSIS — Z23 Encounter for immunization: Secondary | ICD-10-CM

## 2014-09-15 DIAGNOSIS — R739 Hyperglycemia, unspecified: Secondary | ICD-10-CM

## 2014-09-15 DIAGNOSIS — E739 Lactose intolerance, unspecified: Secondary | ICD-10-CM

## 2014-09-15 DIAGNOSIS — D751 Secondary polycythemia: Secondary | ICD-10-CM

## 2014-09-15 DIAGNOSIS — E559 Vitamin D deficiency, unspecified: Secondary | ICD-10-CM | POA: Diagnosis not present

## 2014-09-15 DIAGNOSIS — R140 Abdominal distension (gaseous): Secondary | ICD-10-CM | POA: Diagnosis not present

## 2014-09-15 DIAGNOSIS — D45 Polycythemia vera: Secondary | ICD-10-CM | POA: Diagnosis not present

## 2014-09-15 LAB — TSH: TSH: 1.14 u[IU]/mL (ref 0.35–4.50)

## 2014-09-15 LAB — CBC WITH DIFFERENTIAL/PLATELET
BASOS ABS: 0 10*3/uL (ref 0.0–0.1)
Basophils Relative: 0.4 % (ref 0.0–3.0)
EOS ABS: 0.4 10*3/uL (ref 0.0–0.7)
EOS PCT: 6.5 % — AB (ref 0.0–5.0)
HEMATOCRIT: 51.4 % (ref 39.0–52.0)
HEMOGLOBIN: 17.5 g/dL — AB (ref 13.0–17.0)
LYMPHS PCT: 24.2 % (ref 12.0–46.0)
Lymphs Abs: 1.4 10*3/uL (ref 0.7–4.0)
MCHC: 34.1 g/dL (ref 30.0–36.0)
MCV: 88.3 fl (ref 78.0–100.0)
Monocytes Absolute: 0.6 10*3/uL (ref 0.1–1.0)
Monocytes Relative: 10.7 % (ref 3.0–12.0)
NEUTROS ABS: 3.4 10*3/uL (ref 1.4–7.7)
NEUTROS PCT: 58.2 % (ref 43.0–77.0)
Platelets: 267 10*3/uL (ref 150.0–400.0)
RBC: 5.82 Mil/uL — ABNORMAL HIGH (ref 4.22–5.81)
RDW: 13.3 % (ref 11.5–15.5)
WBC: 5.8 10*3/uL (ref 4.0–10.5)

## 2014-09-15 LAB — BASIC METABOLIC PANEL
BUN: 13 mg/dL (ref 6–23)
CO2: 28 mEq/L (ref 19–32)
Calcium: 9.5 mg/dL (ref 8.4–10.5)
Chloride: 106 mEq/L (ref 96–112)
Creatinine, Ser: 0.9 mg/dL (ref 0.40–1.50)
GFR: 102.62 mL/min (ref >60.00)
GLUCOSE: 113 mg/dL — AB (ref 70–99)
Potassium: 4.1 mEq/L (ref 3.5–5.1)
Sodium: 141 mEq/L (ref 135–145)

## 2014-09-15 LAB — HEPATIC FUNCTION PANEL
ALBUMIN: 4.5 g/dL (ref 3.5–5.2)
ALK PHOS: 81 U/L (ref 39–117)
ALT: 56 U/L — AB (ref 0–53)
AST: 32 U/L (ref 0–37)
Bilirubin, Direct: 0.1 mg/dL (ref 0.0–0.3)
Total Bilirubin: 0.5 mg/dL (ref 0.2–1.2)
Total Protein: 7.8 g/dL (ref 6.0–8.3)

## 2014-09-15 LAB — HEMOGLOBIN A1C: Hgb A1c MFr Bld: 5.6 % (ref 4.6–6.5)

## 2014-09-15 NOTE — Progress Notes (Signed)
Subjective:  Patient ID: Bruce Ellison, male    DOB: 1980-03-22  Age: 34 y.o. MRN: [id number]  CC: No chief complaint on file.   HPI Bruce Ellison presents for a tDAP. He gained a little weight. F/u elev glucose  Outpatient Prescriptions Prior to Visit  Medication Sig Dispense Refill  . ARIPiprazole (ABILIFY) 5 MG tablet TAKE 1 TABLET (5 MG TOTAL) BY MOUTH AT BEDTIME. 30 tablet 0  . desonide (DESOWEN) 0.05 % cream     . mirtazapine (REMERON) 15 MG tablet TAKE 1 TABLET (15 MG TOTAL) BY MOUTH AT BEDTIME. 30 tablet 0  . Multiple Vitamin (MULTIVITAMIN WITH MINERALS) TABS Take 1 tablet by mouth every morning.    . venlafaxine XR (EFFEXOR-XR) 75 MG 24 hr capsule TAKE 1 CAPSULE (75 MG TOTAL) BY MOUTH DAILY. 30 capsule 0  . dicyclomine (BENTYL) 10 MG capsule Take 1 capsule (10 mg total) by mouth 2 (two) times daily before a meal. (Patient not taking: Reported on 09/15/2014) 60 capsule 1  . metroNIDAZOLE (FLAGYL) 250 MG tablet Take 1 tablet (250 mg total) by mouth 3 (three) times daily. (Patient not taking: Reported on 09/15/2014) 30 tablet 0  . rifaximin (XIFAXAN) 550 MG TABS tablet Use one by mouth three times a day for 14 days (Patient not taking: Reported on 09/15/2014) 42 tablet 0   No facility-administered medications prior to visit.    ROS Review of Systems  Constitutional: Positive for unexpected weight change.  Psychiatric/Behavioral: Negative for dysphoric mood. The patient is not nervous/anxious.     Objective:  BP 110/80 mmHg  Pulse 88  Wt 232 lb (105.235 kg)  SpO2 95%  BP Readings from Last 3 Encounters:  09/15/14 110/80  08/26/14 126/82  08/11/14 116/72    Wt Readings from Last 3 Encounters:  09/15/14 232 lb (105.235 kg)  08/26/14 202 lb (91.627 kg)  08/11/14 203 lb (92.08 kg)    Physical Exam  Constitutional:  Obese  Psychiatric: He has a normal mood and affect. His behavior is normal. Judgment and thought content normal.    Lab Results  Component Value Date     WBC 7.8 09/19/2013   HGB 17.0 09/19/2013   HCT 49.9 09/19/2013   PLT 274.0 09/19/2013   GLUCOSE 102* 09/19/2013   CHOL 203* 08/30/2010   TRIG 81.0 08/30/2010   HDL 46.80 08/30/2010   LDLDIRECT 136.9 08/30/2010   LDLCALC 130* 05/02/2006   ALT 35 12/06/2012   AST 26 12/06/2012   NA 140 09/19/2013   K 4.1 09/19/2013   CL 105 09/19/2013   CREATININE 0.9 09/19/2013   BUN 11 09/19/2013   CO2 29 09/19/2013   TSH 0.99 09/19/2013   HGBA1C 5.8 09/19/2013    US Abdomen Complete  12/16/2013   CLINICAL DATA:  34 year old male with recurrent right upper quadrant abdominal pain. Subsequent encounter.  EXAM: ULTRASOUND ABDOMEN COMPLETE  COMPARISON:  Thoracic spine MRI 06/04/2013.  FINDINGS: Gallbladder: No gallstones or wall thickening visualized. No sonographic Murphy sign noted.  Common bile duct: Diameter: 5 mm, normal  Liver: No focal lesion identified. Within normal limits in parenchymal echogenicity. No intrahepatic biliary ductal dilatation.  IVC: No abnormality visualized.  Pancreas: Visualized portion unremarkable.  Spleen: Size and appearance within normal limits.  Right Kidney: Length: 11.7 cm. Echogenicity within normal limits. No mass or hydronephrosis visualized.  Left Kidney: Length: 12.1 cm. Small midpole region cortical cyst measuring up to 10 mm appears simple and unchanged. Echogenicity within normal limits. No  mass or hydronephrosis visualized.  Abdominal aorta: No aneurysm visualized.  Other findings: None.  IMPRESSION: Normal gallbladder.  Normal abdominal ultrasound.   Electronically Signed   By: Lars Pinks M.D.   On: 12/16/2013 09:13    Assessment & Plan:   Diagnoses and all orders for this visit:  Need for Tdap vaccination Orders: -     Tdap vaccine greater than or equal to 7yo IM   I am having Mr. Nicolosi maintain his multivitamin with minerals, desonide, rifaximin, dicyclomine, metroNIDAZOLE, ARIPiprazole, venlafaxine XR, and mirtazapine.  No orders of the defined  types were placed in this encounter.     Follow-up: No Follow-up on file.  Walker Kehr, MD

## 2014-09-15 NOTE — Assessment & Plan Note (Signed)
Labs Diet discussed 

## 2014-09-15 NOTE — Progress Notes (Signed)
Pre visit review using our clinic review tool, if applicable. No additional management support is needed unless otherwise documented below in the visit note. 

## 2014-09-15 NOTE — Assessment & Plan Note (Signed)
CBC

## 2014-09-15 NOTE — Assessment & Plan Note (Signed)
Labs Loose wt

## 2014-09-16 LAB — VITAMIN D 25 HYDROXY (VIT D DEFICIENCY, FRACTURES): VIT D 25 HYDROXY: 30 ng/mL (ref 30–100)

## 2014-09-23 ENCOUNTER — Ambulatory Visit (INDEPENDENT_AMBULATORY_CARE_PROVIDER_SITE_OTHER): Payer: Medicare Other | Admitting: Psychiatry

## 2014-09-23 ENCOUNTER — Encounter (HOSPITAL_COMMUNITY): Payer: Self-pay | Admitting: Psychiatry

## 2014-09-23 VITALS — BP 122/80 | HR 94 | Temp 98.0°F | Resp 18 | Ht 66.0 in | Wt 230.0 lb

## 2014-09-23 DIAGNOSIS — F429 Obsessive-compulsive disorder, unspecified: Secondary | ICD-10-CM

## 2014-09-23 DIAGNOSIS — F42 Obsessive-compulsive disorder: Secondary | ICD-10-CM

## 2014-09-23 DIAGNOSIS — F952 Tourette's disorder: Secondary | ICD-10-CM | POA: Diagnosis not present

## 2014-09-23 DIAGNOSIS — F959 Tic disorder, unspecified: Secondary | ICD-10-CM

## 2014-09-23 DIAGNOSIS — F419 Anxiety disorder, unspecified: Secondary | ICD-10-CM

## 2014-09-23 MED ORDER — MIRTAZAPINE 15 MG PO TABS: 15.0000 mg | ORAL_TABLET | Freq: Every day | ORAL | Status: DC

## 2014-09-23 MED ORDER — ARIPIPRAZOLE 5 MG PO TABS: 5.0000 mg | ORAL_TABLET | Freq: Every day | ORAL | Status: DC

## 2014-09-23 MED ORDER — VENLAFAXINE HCL ER 75 MG PO CP24: 75.0000 mg | ORAL_CAPSULE | Freq: Every day | ORAL | Status: DC

## 2014-09-23 NOTE — Progress Notes (Signed)
[facility] [facility] Progress Note  Bruce Ellison [id number] 34 y.o.  09/23/2014 3:53 PM  Chief Complaint: Medication management and follow-up.    History of Present Illness:  Bruce Ellison came for his appointment.  He is taking his medication as prescribed.  He denies any major panic attack or any anxiety attack.  Recently seen his primary care physician and he has blood work.  He is trying to lose weight but so far he is disappointed that he has not lost .  He denies any irritability, anger, mood swing or any worsening of the depressive symptoms.  He reported his OCD symptoms are under control with the medication.  He has no tremors, shakes or any EPS.  He sleeping good.  His appetite is okay.  His vitals are stable.  He denies any paranoia or any hallucination.  Patient lives with his grandparents and his mother.  Patient denies drinking or using any illegal substances.   Suicidal Ideation: No Plan Formed: No Patient has means to carry out plan: No  Homicidal Ideation: No Plan Formed: No Patient has means to carry out plan: No  Review of Systems: Psychiatric: Agitation: No Hallucination: No Depressed Mood: No Insomnia: No Hypersomnia: No Altered Concentration: No Feels Worthless: No Grandiose Ideas: No Belief In Special Powers: No New/Increased Substance Abuse: No Compulsions: No  ROS   Past Medical History  Diagnosis Date  . Anxiety   . OCD (obsessive compulsive disorder)   . Personality disorder     with schizoid features  . Tourette's     per [facility]  . Insomnia   . HTN (hypertension)     borderline  . Obesity   . Allergic rhinitis   . Glucose intolerance (impaired glucose tolerance)   . GERD (gastroesophageal reflux disease)   . Vitamin D deficiency   . Depression   . Sleep apnea   . Adenomatous colon polyp     tubular   His primary care physician is Dr. Christianne Borrow.   Social history.   Patient was born in [place], [place].  At age 8 his  parents got divorced.  He moved the [place] at age 19.  He is living with his mother and grandparents.  Patient father still lives in [place].  Patient has no contact with his father.  Patient has never married.  He has no children.  He has limited social network.  Outpatient Encounter Prescriptions as of 09/23/2014  Medication Sig  . ARIPiprazole (ABILIFY) 5 MG tablet Take 1 tablet (5 mg total) by mouth daily.  [name] desonide (DESOWEN) 0.05 % cream   . dicyclomine (BENTYL) 10 MG capsule Take 1 capsule (10 mg total) by mouth 2 (two) times daily before a meal. (Patient not taking: Reported on 09/15/2014)  . metroNIDAZOLE (FLAGYL) 250 MG tablet Take 1 tablet (250 mg total) by mouth 3 (three) times daily. (Patient not taking: Reported on 09/15/2014)  . mirtazapine (REMERON) 15 MG tablet Take 1 tablet (15 mg total) by mouth at bedtime.  . Multiple Vitamin (MULTIVITAMIN WITH MINERALS) TABS Take 1 tablet by mouth every morning.  . rifaximin (XIFAXAN) 550 MG TABS tablet Use one by mouth three times a day for 14 days (Patient not taking: Reported on 09/15/2014)  . venlafaxine XR (EFFEXOR-XR) 75 MG 24 hr capsule Take 1 capsule (75 mg total) by mouth daily with breakfast.  . [DISCONTINUED] ARIPiprazole (ABILIFY) 5 MG tablet TAKE 1 TABLET (5 MG TOTAL) BY MOUTH AT BEDTIME.  . [DISCONTINUED] mirtazapine (  REMERON) 15 MG tablet TAKE 1 TABLET (15 MG TOTAL) BY MOUTH AT BEDTIME.  . [DISCONTINUED] venlafaxine XR (EFFEXOR-XR) 75 MG 24 hr capsule TAKE 1 CAPSULE (75 MG TOTAL) BY MOUTH DAILY.   No facility-administered encounter medications on file as of 09/23/2014.    Past Psychiatric History/Hospitalization(s): Anxiety: Patient has history of psychiatric illness at age 66.  At that time he was in [place].  He has Tic, anxiety and anger. Bipolar Disorder: No Depression: Yes, he had tried Zoloft, Prozac and recently Luvox. Mania: No Psychosis: Patient has history of paranoia.  In the past he had tried Geodon, Risperdal and  Seroquel.  He was seeing psychiatrist at [place].  In the past he was also to get treatment in [place].  He was seeing psychiatrist in [place] in [place].  He&amp;#39;s been taking the medication since age 56.  Recently his been seeing Dr. Toy Care who had prescribed her Zoloft, Valium, Klonopin, Xanax, Prozac and Ambien.  We also tried luvox however he developed more irritability. Schizophrenia: No Personality Disorder: No Hospitalization for psychiatric illness: No History of Electroconvulsive Shock Therapy: No Prior Suicide Attempts: No  Physical Exam: Constitutional:  BP 122/80 mmHg  Pulse 94  Temp(Src) 98 F (36.7 C)  Resp 18  Ht 5\' 6"  (1.676 m)  Wt 230 lb (104.327 kg)  BMI 37.14 kg/m2  SpO2 100%  Recent Results (from the past 2160 hour(s))  Pancreatic Elastase, Fecal     Status: None   Collection Time: 08/12/14 11:19 AM  Result Value Ref Range   Pancreatic Elastase-1, Stool 382 mcg/g    Comment:   Adult and Pediatric Reference Ranges for   Pancreatic Elastase-1:                Normal:      >200 mcg/g Moderate Pancreatic       Insufficiency:   100-200 mcg/g   Severe Pancreatic       Insufficiency:      <100 mcg/g   Elastase-1 (E-1) assay results are expressed in mcg/g, which represent mcg E1/g feces.   It is not necessary to interrupt enzyme substitution therapy.   TSH     Status: None   Collection Time: 09/15/14  8:13 AM  Result Value Ref Range   TSH 1.14 0.35 - 4.50 uIU/mL  Basic metabolic panel     Status: Abnormal   Collection Time: 09/15/14  8:13 AM  Result Value Ref Range   Sodium 141 135 - 145 mEq/L   Potassium 4.1 3.5 - 5.1 mEq/L   Chloride 106 96 - 112 mEq/L   CO2 28 19 - 32 mEq/L   Glucose, Bld 113 (H) 70 - 99 mg/dL   BUN 13 6 - 23 mg/dL   Creatinine, Ser 0.90 0.40 - 1.50 mg/dL   Calcium 9.5 8.4 - 10.5 mg/dL   GFR 102.62 >60.00 mL/min  CBC with Differential/Platelet     Status: Abnormal   Collection Time: 09/15/14  8:13 AM   Result Value Ref Range   WBC 5.8 4.0 - 10.5 K/uL   RBC 5.82 (H) 4.22 - 5.81 Mil/uL   Hemoglobin 17.5 (H) 13.0 - 17.0 g/dL   HCT 51.4 39.0 - 52.0 %   MCV 88.3 78.0 - 100.0 fl   MCHC 34.1 30.0 - 36.0 g/dL   RDW 13.3 11.5 - 15.5 %   Platelets 267.0 150.0 - 400.0 K/uL   Neutrophils Relative % 58.2 43.0 - 77.0 %   Lymphocytes Relative  24.2 12.0 - 46.0 %   Monocytes Relative 10.7 3.0 - 12.0 %   Eosinophils Relative 6.5 (H) 0.0 - 5.0 %   Basophils Relative 0.4 0.0 - 3.0 %   Neutro Abs 3.4 1.4 - 7.7 K/uL   Lymphs Abs 1.4 0.7 - 4.0 K/uL   Monocytes Absolute 0.6 0.1 - 1.0 K/uL   Eosinophils Absolute 0.4 0.0 - 0.7 K/uL   Basophils Absolute 0.0 0.0 - 0.1 K/uL  Hemoglobin A1c     Status: None   Collection Time: 09/15/14  8:13 AM  Result Value Ref Range   Hgb A1c MFr Bld 5.6 4.6 - 6.5 %    Comment: Glycemic Control Guidelines for People with Diabetes:Non Diabetic:  <6%Goal of Therapy: <7%Additional Action Suggested:  >8%   Hepatic function panel     Status: Abnormal   Collection Time: 09/15/14  8:13 AM  Result Value Ref Range   Total Bilirubin 0.5 0.2 - 1.2 mg/dL   Bilirubin, Direct 0.1 0.0 - 0.3 mg/dL   Alkaline Phosphatase 81 39 - 117 U/L   AST 32 0 - 37 U/L   ALT 56 (H) 0 - 53 U/L   Total Protein 7.8 6.0 - 8.3 g/dL   Albumin 4.5 3.5 - 5.2 g/dL  Vit D  25 hydroxy (rtn osteoporosis monitoring)     Status: None   Collection Time: 09/15/14  8:13 AM  Result Value Ref Range   Vit D, 25-Hydroxy 30 30 - 100 ng/mL    Comment: Vitamin D Status           25-OH Vitamin D        Deficiency                <20 ng/mL        Insufficiency         20 - 29 ng/mL        Optimal             > or = 30 ng/mL   For 25-OH Vitamin D testing on patients on D2-supplementation and patients for whom quantitation of D2 and D3 fractions is required, the QuestAssureD 25-OH VIT D, (D2,D3), LC/MS/MS is recommended: order code [id number] (patients > 2 yrs).     General Appearance: well  nourished  Musculoskeletal: Strength & Muscle Tone: within normal limits Gait & Station: normal Patient leans: N/A  Mental status examination: Patient is casually dressed and groomed.  He described his mood anxious and his affect is appropriate.  He maintained fair eye contact.  His his speech is slow, clear and coherent.  He denies any auditory or visual hallucination.  He denies any active or passive suicidal thoughts or homicidal thought.  There were no delusions or any paranoia.  His psychomotor activity is normal.  His attention and concentration is fair.  His cognition is intact.  His fund of knowledge is adequate.  He is alert and oriented 3.  His insight judgment and impulse control is okay.  Established Problem, Stable/Improving (1), Review or order clinical lab tests (1), Review of Last Therapy Session (1) and Review of Medication Regimen & Side Effects (2)  Assessment: Axis I: Obsessive-compulsive disorder, Tourette's syndrome, Tic disorder, anxiety disorder NOS  Axis II: Deferred  Axis III: See medical history Patient Active Problem List   Diagnosis Date Noted  . Nonallopathic lesion-rib cage 12/31/2013  . Nonallopathic lesion of lumbosacral region 12/31/2013  . Sleep difficulties 12/28/2013  . Diarrhea 10/10/2013  . Cervical disc  disorder with radiculopathy of cervical region 06/11/2013  . Acute thoracic back pain 05/28/2013  . Muscle spasm of back 03/24/2013  . Nonallopathic lesion of thoracic region 03/24/2013  . Finger wound, simple, open 12/08/2012  . Abdominal distention 12/06/2012  . Sinusitis nasal 06/04/2012  . Hyperglycemia 06/04/2012  . Weight gain 06/04/2012  . Major depressive disorder, recurrent episode, severe, without mention of psychotic behavior 05/28/2012  . Generalized anxiety disorder 05/28/2012  . Varicose veins of lower extremities with other complications 85/27/7824  . OSA (obstructive sleep apnea) 02/14/2011  . Polycythemia 12/14/2010  .  Cerumen impaction 06/24/2010  . VITAMIN D DEFICIENCY 12/25/2008  . SEBORRHEIC DERMATITIS 07/23/2008  . FREQUENCY, URINARY 07/23/2008  . CELLULITIS/ABSCESS, FOOT 09/11/2007  . GLUCOSE INTOLERANCE 04/11/2007  . ALLERGIC RHINITIS 04/11/2007  . GERD 04/11/2007  . BACK PAIN 04/11/2007    Plan:   Patient is stable on his current medication.  I review blood work results.  Encouraged watching his calorie intake and regular exercise .  Continue Abilify 5 mg daily, Remeron 15 mg at bedtime and Effexor 75 mg daily.  Recommended to call us back if he has any question, concern.  He is worsening of the symptom.  Follow-up in 3 months.  Bena Kobel T., MD 09/23/2014

## 2014-09-26 ENCOUNTER — Encounter: Payer: Self-pay | Admitting: Internal Medicine

## 2014-09-28 ENCOUNTER — Encounter: Payer: Self-pay | Admitting: Internal Medicine

## 2014-10-06 ENCOUNTER — Other Ambulatory Visit (HOSPITAL_COMMUNITY): Payer: Self-pay | Admitting: Psychiatry

## 2014-10-06 ENCOUNTER — Ambulatory Visit: Payer: Self-pay | Admitting: Internal Medicine

## 2014-10-12 NOTE — Telephone Encounter (Signed)
Abilify refill order was declined as a new order had already been e-scribed by Dr. Adele Schilder on 09/23/14 plus 2 refills.

## 2014-10-22 ENCOUNTER — Telehealth (HOSPITAL_COMMUNITY): Payer: Self-pay

## 2014-10-22 NOTE — Telephone Encounter (Signed)
Medication management - Telephone call with patient to inform the forms requested to request an accommodation for him to be moved up in line and to have longer to take his Nurse Aide Examination were prepared for pick up but consent was needed to fax. Dr. Adele Schilder reviewed and signed forms and patient stated he would come in on 10/23/14 to sign consent to send the forms and reminded him our office closes at 1pm on Fridays.  Patient agreed to come in prior to that time to send out forms.

## 2014-11-17 ENCOUNTER — Encounter: Payer: Self-pay | Admitting: Family Medicine

## 2014-11-17 ENCOUNTER — Ambulatory Visit (INDEPENDENT_AMBULATORY_CARE_PROVIDER_SITE_OTHER): Payer: Medicare Other | Admitting: Family Medicine

## 2014-11-17 VITALS — BP 122/78 | HR 91 | Ht 66.0 in | Wt 235.0 lb

## 2014-11-17 DIAGNOSIS — M546 Pain in thoracic spine: Secondary | ICD-10-CM

## 2014-11-17 DIAGNOSIS — M9902 Segmental and somatic dysfunction of thoracic region: Secondary | ICD-10-CM

## 2014-11-17 DIAGNOSIS — M9903 Segmental and somatic dysfunction of lumbar region: Secondary | ICD-10-CM

## 2014-11-17 DIAGNOSIS — M9908 Segmental and somatic dysfunction of rib cage: Secondary | ICD-10-CM | POA: Diagnosis not present

## 2014-11-17 DIAGNOSIS — M999 Biomechanical lesion, unspecified: Secondary | ICD-10-CM

## 2014-11-17 NOTE — Assessment & Plan Note (Signed)
Decision today to treat with OMT was based on Physical Exam  After verbal consent patient was treated with HVLA techniques in thoracic, rib and lumbar and sacral areas  Patient tolerated the procedure well with improvement in symptoms  Patient given exercises, stretches and lifestyle modifications  See medications in patient instructions if given  Patient will follow up in 8-12 weeks

## 2014-11-17 NOTE — Patient Instructions (Addendum)
Continue to work on [employer] are doing great No change any medicine See me when you need me (8-12 weeks)

## 2014-11-17 NOTE — Assessment & Plan Note (Signed)
Patient overall seems to be doing relatively better. I do not feel that any significant changes need to be made. His lungs patient continues to do well. We'll make no changes in his medications at this time. Discussed icing regimen. We discussed the importance of the posteroinferior ergonomics are up-to-date. As long as patient does well he can follow-up every 2-3 months.

## 2014-11-17 NOTE — Progress Notes (Signed)
Pre visit review using our clinic review tool, if applicable. No additional management support is needed unless otherwise documented below in the visit note. 

## 2014-11-17 NOTE — Progress Notes (Signed)
  CC: Back and neck pain followup  HPI: Patient states overall he is been doing relatively well. Patient overall has been doing relatively well. Patient does have exacerbations of back pain from time to time and is having some mild tightness. Patient states overall he has been doing really well. It has been 3 months since his last appointment. Has noticed some stiffness over the course last 2 weeks. Has not done anything different. Patient is feels that he needs another manipulation.    PMHx Patient had MRI of the cervical and thoracic spine done. Patient does have a broad based disc osteophyte complex with spurring worse on the right at T7-T8 that is giving mild right foraminal narrowing that could be consistent with patient's pain. Patient did undergo an epidural steroid injection previously quite some time ago. Has not needed a repeat for quite some time.  Past medical, surgical, family and social history reviewed. Medications reviewed all in the electronic medical record.   Review of Systems: No headache, visual changes, nausea, vomiting, diarrhea, constipation, dizziness, abdominal pain, skin rash, fevers, chills, night sweats, weight loss, swollen lymph nodes, body aches, joint swelling, muscle aches, chest pain, shortness of breath, mood changes.   Objective:    Blood pressure 122/78, pulse 91, height 5\' 6"  (1.676 m), weight 235 lb (106.595 kg), SpO2 97 %.   General: No apparent distress alert and oriented x3 mood and affect normal, dressed appropriately.  HEENT: Pupils equal, extraocular movements intact Respiratory: Patient's speak in full sentences and does not appear short of breath Cardiovascular: No lower extremity edema, non tender, no erythema Skin: Warm dry intact with no signs of infection or rash on extremities or on axial skeleton. Abdomen: Soft nontender Neuro: Cranial nerves II through XII are intact, neurovascularly intact in all extremities with 2+ DTRs and 2+  pulses. Lymph: No lymphadenopathy of posterior or anterior cervical chain or axillae bilaterally.  Gait normal with good balance and coordination.  MSK: Non tender with full range of motion and good stability and symmetric strength and tone of shoulders, elbows, wrist, hip, knee and ankles bilaterally.    Neck: Inspection unremarkable. Mild increase of kyphosis at the T1. No palpable stepoffs. Negative Spurling's test  Full neck range of motion Grip strength and sensation normal in bilateral hands Strength good C4 to T1 distribution No sensory change to C4 to T1 Negative Hoffman sign bilaterally Reflexes normal Back Exam:  Inspection: Unremarkable  Motion: Flexion 35 deg, Extension 45 deg, Side Bending to 45 deg bilaterally,  Rotation to 45 deg bilaterally  SLR laying: Negative  XSLR laying: Negative  Mild tenderness in the mid thoracic spine in the paraspinal musculature. FABER: negative. Sensory change: Gross sensation intact to all lumbar and sacral dermatomes.  Reflexes: 2+ at both patellar tendons, 2+ at achilles tendons, Babinski's downgoing.  Strength at foot  Plantar-flexion: 5/5 Dorsi-flexion: 5/5 Eversion: 5/5 Inversion: 5/5  Leg strength  Quad: 5/5 Hamstring: 5/5 Hip flexor: 5/5 Hip abductors: 4/5  Gait unremarkable.   Osteopathic findings Cervical C4 flexed rotated and side bent left C7 flexed rotated and side bent right Thoracic T3 extended rotated and side bent right inhaled third rib T7 extended rotated and side bent right  Lumbar L2 flexed rotated and side bent right Sacrum Right on right  Impression and Recommendations:     This case required medical decision making of moderate complexity.

## 2014-12-07 DIAGNOSIS — Z0184 Encounter for antibody response examination: Secondary | ICD-10-CM | POA: Diagnosis not present

## 2014-12-07 DIAGNOSIS — Z23 Encounter for immunization: Secondary | ICD-10-CM | POA: Diagnosis not present

## 2014-12-17 ENCOUNTER — Ambulatory Visit: Payer: Self-pay | Admitting: Internal Medicine

## 2014-12-24 ENCOUNTER — Ambulatory Visit (HOSPITAL_COMMUNITY): Payer: Self-pay | Admitting: Psychiatry

## 2014-12-27 DIAGNOSIS — Z1159 Encounter for screening for other viral diseases: Secondary | ICD-10-CM | POA: Diagnosis not present

## 2014-12-28 ENCOUNTER — Telehealth: Payer: Self-pay | Admitting: Internal Medicine

## 2014-12-29 ENCOUNTER — Ambulatory Visit (INDEPENDENT_AMBULATORY_CARE_PROVIDER_SITE_OTHER): Payer: Medicare Other | Admitting: Psychiatry

## 2014-12-29 ENCOUNTER — Encounter (HOSPITAL_COMMUNITY): Payer: Self-pay | Admitting: Psychiatry

## 2014-12-29 VITALS — BP 120/80 | HR 80 | Resp 14 | Wt 232.2 lb

## 2014-12-29 DIAGNOSIS — F959 Tic disorder, unspecified: Secondary | ICD-10-CM

## 2014-12-29 DIAGNOSIS — F952 Tourette's disorder: Secondary | ICD-10-CM | POA: Diagnosis not present

## 2014-12-29 DIAGNOSIS — F419 Anxiety disorder, unspecified: Secondary | ICD-10-CM | POA: Diagnosis not present

## 2014-12-29 DIAGNOSIS — F429 Obsessive-compulsive disorder, unspecified: Secondary | ICD-10-CM | POA: Diagnosis not present

## 2014-12-29 MED ORDER — VENLAFAXINE HCL ER 75 MG PO CP24: 75.0000 mg | ORAL_CAPSULE | Freq: Every day | ORAL | Status: DC

## 2014-12-29 MED ORDER — MIRTAZAPINE 15 MG PO TABS: 15.0000 mg | ORAL_TABLET | Freq: Every day | ORAL | Status: DC

## 2014-12-29 MED ORDER — ARIPIPRAZOLE 5 MG PO TABS: 5.0000 mg | ORAL_TABLET | Freq: Every day | ORAL | Status: DC

## 2014-12-29 NOTE — Progress Notes (Signed)
[place]  [place] [id number] 34 y.o.  12/29/2014 3:26 PM  Chief Complaint: Medication management and follow-up.    History of Present Illness:  [place] came for his appointment.  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  He has not started yet that he is hoping to start pretty soon.  He is compliant with his medication.  He reported no side effects.  He denies any agitation, irritability, anger, mood swing.  He reported his OCD symptoms are under control.  He denies any feeling of hopelessness or worthlessness.  His sleep is good.  His appetite is okay.  He denies any paranoia or any hallucination.  His vitals are stable.  He lives with his mother and grandfather.  His grandmother died in 12-Apr-2022.  Patient denies drinking or using any illegal substances. substances.   Suicidal Ideation: No Plan Formed: No Patient has means to carry out plan: No  Homicidal Ideation: No Plan Formed: No Patient has means to carry out plan: No  Review of Systems: Psychiatric: Agitation: No Hallucination: No Depressed Mood: No Insomnia: No Hypersomnia: No Altered Concentration: No Feels Worthless: No Grandiose Ideas: No Belief In Special Powers: No New/Increased Substance Abuse: No Compulsions: No  ROS   Past Medical History  Diagnosis Date  . Anxiety   . OCD (obsessive compulsive disorder)   . Personality disorder     with schizoid features  . Tourette's     per [facility]  . Insomnia   . HTN (hypertension)     borderline  . Obesity   . Allergic rhinitis   . Glucose intolerance (impaired glucose tolerance)   . GERD (gastroesophageal reflux disease)   . Vitamin D deficiency   . Depression   . Sleep apnea   . Adenomatous colon polyp     tubular   His primary care physician is Dr. Christianne [place].   Social history.   Patient was born in [place], [place].  At age 65 his parents got divorced.  He  moved the [place] at age 60.  He is living with his mother and grandparents.  Patient father still lives in [place].  Patient has no contact with his father.  Patient has never married.  He has no children.  He has limited social network.  Outpatient Encounter Prescriptions as of 12/29/2014  Medication Sig  . ARIPiprazole (ABILIFY) 5 MG tablet Take 1 tablet (5 mg total) by mouth daily.  [name] desonide (DESOWEN) 0.05 % cream   . dicyclomine (BENTYL) 10 MG capsule Take 1 capsule (10 mg total) by mouth 2 (two) times daily before a meal.  . metroNIDAZOLE (FLAGYL) 250 MG tablet Take 1 tablet (250 mg total) by mouth 3 (three) times daily.  . mirtazapine (REMERON) 15 MG tablet Take 1 tablet (15 mg total) by mouth at bedtime.  . Multiple Vitamin (MULTIVITAMIN WITH MINERALS) TABS Take 1 tablet by mouth every morning.  . rifaximin (XIFAXAN) 550 MG TABS tablet Use one by mouth three times a day for 14 days  . venlafaxine XR (EFFEXOR-XR) 75 MG 24 hr capsule Take 1 capsule (75 mg total) by mouth daily with breakfast.  . [DISCONTINUED] ARIPiprazole (ABILIFY) 5 MG tablet Take 1 tablet (5 mg total) by mouth daily.  . [DISCONTINUED] mirtazapine (REMERON) 15 MG tablet Take 1 tablet (15 mg total) by mouth at bedtime.  . [DISCONTINUED] venlafaxine XR (EFFEXOR-XR) 75 MG  24 hr capsule Take 1 capsule (75 mg total) by mouth daily with breakfast.   No facility-administered encounter medications on file as of 12/29/2014.    Past Psychiatric History/Hospitalization(s): Anxiety: Patient has history of psychiatric illness at age 22.  At that time he was in [place].  He has Tic, anxiety and anger. Bipolar Disorder: No Depression: Yes, he had tried Zoloft, Prozac and recently Luvox. Mania: No Psychosis: Patient has history of paranoia.  In the past he had tried Geodon, Risperdal and Seroquel.  He was seeing psychiatrist at [place].  In the past he was also to get treatment in [place].  He was seeing psychiatrist  [place].  He&amp;#39;s been taking the medication since age 78.  Recently his been seeing Dr. Toy Care who had prescribed her Zoloft, Valium, Klonopin, Xanax, Prozac and Ambien.  We also tried luvox however he developed more irritability. Schizophrenia: No Personality Disorder: No Hospitalization for psychiatric illness: No History of Electroconvulsive Shock Therapy: No Prior Suicide Attempts: No  Physical Exam: Constitutional:  BP 120/80 mmHg  Pulse 80  Resp 14  Wt 232 lb 3.2 oz (105.325 kg)  No results found for this or any previous visit (from the past 2160 hour(s)).  General Appearance: well nourished  Musculoskeletal: Strength & Muscle Tone: within normal limits Gait & Station: normal Patient leans: N/A  Mental status examination: Patient is casually dressed and groomed.  He is cooperative and maintained good eye contact.  He appears anxious and his affect is mood appropriate.  His speech is clear and coherent.  He denies any auditory or visual hallucination.  He denies any active or passive suicidal thoughts or homicidal thought.  There were no delusions or any paranoia.  His psychomotor activity is normal.  He has no flight of ideas or any loose association.  His attention and concentration is fair.  His cognition is intact.  His fund of knowledge is adequate.  He is alert and oriented 3.  His insight judgment and impulse control is okay.  Established Problem, Stable/Improving (1), Review or order clinical lab tests (1), Review of Last Therapy Session (1) and Review of Medication Regimen & Side Effects (2)  Assessment: Axis I: Obsessive-compulsive disorder, Tourette's syndrome, Tic disorder, anxiety disorder NOS  Axis II: Deferred  Axis III: See medical history Patient Active Problem List   Diagnosis Date Noted  . Nonallopathic lesion-rib cage 12/31/2013  . Nonallopathic lesion of lumbosacral region 12/31/2013  . Sleep difficulties 12/28/2013  . Diarrhea  10/10/2013  . Cervical disc disorder with radiculopathy of cervical region 06/11/2013  . Acute thoracic back pain 05/28/2013  . Muscle spasm of back 03/24/2013  . Nonallopathic lesion of thoracic region 03/24/2013  . Finger wound, simple, open 12/08/2012  . Abdominal distention 12/06/2012  . Sinusitis nasal 06/04/2012  . Hyperglycemia 06/04/2012  . Weight gain 06/04/2012  . Major depressive disorder, recurrent episode, severe, without mention of psychotic behavior 05/28/2012  . Generalized anxiety disorder 05/28/2012  . Varicose veins of lower extremities with other complications 81/82/9937  . OSA (obstructive sleep apnea) 02/14/2011  . Polycythemia 12/14/2010  . Cerumen impaction 06/24/2010  . VITAMIN D DEFICIENCY 12/25/2008  . SEBORRHEIC DERMATITIS 07/23/2008  . FREQUENCY, URINARY 07/23/2008  . CELLULITIS/ABSCESS, FOOT 09/11/2007  . GLUCOSE INTOLERANCE 04/11/2007  . ALLERGIC RHINITIS 04/11/2007  . GERD 04/11/2007  . Backache 04/11/2007    Plan:   Patient is stable on his current medication.  He has no tremors, shakes or  any EPS.  I will continue Abilify 5 mg daily, Remeron 15 mg at bedtime and Effexor 75 mg daily.  Discussed medication side effects and benefits.  Recommended to call us back if he has any question or any concern.  Follow-up in 3 months.  Brylin Stopper T., MD 12/29/2014

## 2015-01-01 LAB — GLUCOSE, POCT (MANUAL RESULT ENTRY): POC GLUCOSE: 114 mg/dL — AB (ref 70–99)

## 2015-01-06 ENCOUNTER — Telehealth: Payer: Self-pay | Admitting: Internal Medicine

## 2015-01-06 NOTE — Telephone Encounter (Signed)
Pt called asking if we can help determined which immunization he need to complete for his job. Please give him a call back  Phone # [phone]

## 2015-01-06 NOTE — Telephone Encounter (Signed)
Patient called back stating he needs appointment ASAP

## 2015-01-07 NOTE — Telephone Encounter (Signed)
Please advise if you are ok with patient getting these labs done, i will call patient back, thanks

## 2015-01-07 NOTE — Telephone Encounter (Signed)
Pt call back to check up on this request, pt also stated he does not need the TB blood work anymore. Please call pt today if possible. Want to get this done ASAP

## 2015-01-07 NOTE — Telephone Encounter (Signed)
Patient was wanting to know which immunizations his insurance company covers---i explained that patient will need to contact his insurance co and ask what immunizations they cover and call the office back to schedule based on what his insurance co tells him they will pay for

## 2015-01-07 NOTE — Telephone Encounter (Signed)
Patient is needing some lab work done for his job. He is needing TB blood work, chicken pox tider and 12 panel urine screening. Can you put the lab orders in or does he need to come in for OV Please advise

## 2015-01-07 NOTE — Telephone Encounter (Signed)
OK. Thx

## 2015-01-08 ENCOUNTER — Other Ambulatory Visit: Payer: Self-pay | Admitting: Internal Medicine

## 2015-01-08 DIAGNOSIS — Z021 Encounter for pre-employment examination: Secondary | ICD-10-CM

## 2015-01-08 DIAGNOSIS — Z20820 Contact with and (suspected) exposure to varicella: Secondary | ICD-10-CM

## 2015-01-11 ENCOUNTER — Other Ambulatory Visit: Payer: Medicare Other

## 2015-01-11 DIAGNOSIS — Z20820 Contact with and (suspected) exposure to varicella: Secondary | ICD-10-CM | POA: Diagnosis not present

## 2015-01-11 DIAGNOSIS — Z79899 Other long term (current) drug therapy: Secondary | ICD-10-CM | POA: Diagnosis not present

## 2015-01-12 LAB — VARICELLA ZOSTER ANTIBODY, IGM: Varicella Zoster Ab IgM: 0.11 {ISR} (ref <0.91)

## 2015-01-14 ENCOUNTER — Telehealth: Payer: Self-pay | Admitting: *Deleted

## 2015-01-14 NOTE — Telephone Encounter (Signed)
Notified pt copy ready for pick-up...[place]

## 2015-01-14 NOTE — Telephone Encounter (Signed)
pls provide him w/a copy Thx

## 2015-01-14 NOTE — Telephone Encounter (Signed)
Received call pt states he is needing results back from chicken pox titer ASAP. Needing copy for his job before tomorrow...Johny Chess

## 2015-01-15 ENCOUNTER — Telehealth: Payer: Self-pay | Admitting: Internal Medicine

## 2015-01-15 NOTE — Telephone Encounter (Signed)
Pt called in wanting to pick up his urine drug screening.  Amy put a copy on your desk. Please let me know when it is ready for pick up and I will call him.

## 2015-01-15 NOTE — Telephone Encounter (Signed)
Left msg on vm for patient to pick up

## 2015-01-22 ENCOUNTER — Other Ambulatory Visit (HOSPITAL_COMMUNITY): Payer: Self-pay | Admitting: Psychiatry

## 2015-03-10 ENCOUNTER — Other Ambulatory Visit (INDEPENDENT_AMBULATORY_CARE_PROVIDER_SITE_OTHER): Payer: Medicare Other

## 2015-03-10 ENCOUNTER — Encounter: Payer: Self-pay | Admitting: Internal Medicine

## 2015-03-10 ENCOUNTER — Ambulatory Visit (INDEPENDENT_AMBULATORY_CARE_PROVIDER_SITE_OTHER): Payer: Medicare Other | Admitting: Internal Medicine

## 2015-03-10 VITALS — BP 128/82 | HR 96 | Temp 98.2°F | Ht 66.0 in | Wt 232.0 lb

## 2015-03-10 DIAGNOSIS — H6123 Impacted cerumen, bilateral: Secondary | ICD-10-CM

## 2015-03-10 DIAGNOSIS — R739 Hyperglycemia, unspecified: Secondary | ICD-10-CM | POA: Diagnosis not present

## 2015-03-10 DIAGNOSIS — N320 Bladder-neck obstruction: Secondary | ICD-10-CM | POA: Diagnosis not present

## 2015-03-10 DIAGNOSIS — E785 Hyperlipidemia, unspecified: Secondary | ICD-10-CM | POA: Diagnosis not present

## 2015-03-10 LAB — BASIC METABOLIC PANEL
BUN: 10 mg/dL (ref 6–23)
CHLORIDE: 104 meq/L (ref 96–112)
CO2: 29 meq/L (ref 19–32)
CREATININE: 0.89 mg/dL (ref 0.40–1.50)
Calcium: 9.9 mg/dL (ref 8.4–10.5)
GFR: 103.65 mL/min (ref >60.00)
Glucose, Bld: 111 mg/dL — ABNORMAL HIGH (ref 70–99)
POTASSIUM: 4.3 meq/L (ref 3.5–5.1)
Sodium: 141 mEq/L (ref 135–145)

## 2015-03-10 LAB — URINALYSIS, ROUTINE W REFLEX MICROSCOPIC
BILIRUBIN URINE: NEGATIVE
Hgb urine dipstick: NEGATIVE
KETONES UR: NEGATIVE
Leukocytes, UA: NEGATIVE
Nitrite: NEGATIVE
PH: 7 (ref 5.0–8.0)
RBC / HPF: NONE SEEN (ref 0–?)
SPECIFIC GRAVITY, URINE: 1.02 (ref 1.000–1.030)
TOTAL PROTEIN, URINE-UPE24: NEGATIVE
UROBILINOGEN UA: 0.2 (ref 0.0–1.0)
Urine Glucose: NEGATIVE

## 2015-03-10 LAB — LIPID PANEL
Cholesterol: 193 mg/dL (ref 0–200)
HDL: 42.4 mg/dL (ref >39.00)
LDL Cholesterol: 130 mg/dL — ABNORMAL HIGH (ref 0–99)
NONHDL: 150.52
Total CHOL/HDL Ratio: 5
Triglycerides: 104 mg/dL (ref 0.0–149.0)
VLDL: 20.8 mg/dL (ref 0.0–40.0)

## 2015-03-10 LAB — CBC WITH DIFFERENTIAL/PLATELET
BASOS ABS: 0 10*3/uL (ref 0.0–0.1)
Basophils Relative: 0.5 % (ref 0.0–3.0)
EOS ABS: 0.5 10*3/uL (ref 0.0–0.7)
Eosinophils Relative: 7.8 % — ABNORMAL HIGH (ref 0.0–5.0)
HCT: 51.3 % (ref 39.0–52.0)
Hemoglobin: 17 g/dL (ref 13.0–17.0)
LYMPHS ABS: 1.6 10*3/uL (ref 0.7–4.0)
Lymphocytes Relative: 25.5 % (ref 12.0–46.0)
MCHC: 33.2 g/dL (ref 30.0–36.0)
MCV: 89.1 fl (ref 78.0–100.0)
MONO ABS: 0.6 10*3/uL (ref 0.1–1.0)
Monocytes Relative: 9.7 % (ref 3.0–12.0)
NEUTROS ABS: 3.6 10*3/uL (ref 1.4–7.7)
NEUTROS PCT: 56.5 % (ref 43.0–77.0)
PLATELETS: 273 10*3/uL (ref 150.0–400.0)
RBC: 5.76 Mil/uL (ref 4.22–5.81)
RDW: 13.3 % (ref 11.5–15.5)
WBC: 6.4 10*3/uL (ref 4.0–10.5)

## 2015-03-10 LAB — HEPATIC FUNCTION PANEL
ALK PHOS: 78 U/L (ref 39–117)
ALT: 54 U/L — AB (ref 0–53)
AST: 27 U/L (ref 0–37)
Albumin: 4.7 g/dL (ref 3.5–5.2)
BILIRUBIN DIRECT: 0.1 mg/dL (ref 0.0–0.3)
TOTAL PROTEIN: 7.4 g/dL (ref 6.0–8.3)
Total Bilirubin: 0.5 mg/dL (ref 0.2–1.2)

## 2015-03-10 LAB — HEMOGLOBIN A1C: HEMOGLOBIN A1C: 5.6 % (ref 4.6–6.5)

## 2015-03-10 LAB — TSH: TSH: 1.09 u[IU]/mL (ref 0.35–4.50)

## 2015-03-10 LAB — PSA: PSA: 0.65 ng/mL (ref 0.10–4.00)

## 2015-03-10 NOTE — Progress Notes (Signed)
Subjective:    Patient ID: [place], male    DOB: Jul 28, 1980, 35 y.o.   MRN: [id number]  HPI  Here to fu with c/o bilat hearing loss without pain, HA, sinus congestion or pain, swelling, ST or cough. Pt denies chest pain, increased sob or doe, wheezing, orthopnea, PND, increased LE swelling, palpitations, dizziness or syncope.  Pt denies new neurological symptoms such as new headache, or facial or extremity weakness or numbness   Pt denies polydipsia, polyuria,  Asks for routine labs, usually see Dr Alain Marion, asking for a1c to r/o DM Past Medical History  Diagnosis Date  . Anxiety   . OCD (obsessive compulsive disorder)   . Personality disorder     with schizoid features  . Tourette's     per [facility]  . Insomnia   . HTN (hypertension)     borderline  . Obesity   . Allergic rhinitis   . Glucose intolerance (impaired glucose tolerance)   . GERD (gastroesophageal reflux disease)   . Vitamin D deficiency   . Depression   . Sleep apnea   . Adenomatous colon polyp     tubular   Past Surgical History  Procedure Laterality Date  . Adnoidectomy  1992    reports that he has never smoked. He has never used smokeless tobacco. He reports that he does not drink alcohol or use illicit drugs. family history includes Bipolar disorder in his father; Cholelithiasis in his maternal grandmother; Colon cancer in his other; Colon polyps in his maternal grandfather; Depression in his mother; Diabetes in his maternal grandmother; Prostate cancer in his maternal grandfather. There is no history of Esophageal cancer, Rectal cancer, or Stomach cancer. Allergies  Allergen Reactions  . Honey Bee Treatment [Bee Venom] Anaphylaxis, Hives and Rash    Pt reports he does not go into anaphylaxis with bee stings, just honey  . Doxycycline   . Quetiapine Other (See Comments)    restlessness  . Penicillins Hives and Rash  . Sulfa Antibiotics Hives and Rash   Current Outpatient Prescriptions on File  Prior to Visit  Medication Sig Dispense Refill  . ARIPiprazole (ABILIFY) 5 MG tablet Take 1 tablet (5 mg total) by mouth daily. 30 tablet 2  . desonide (DESOWEN) 0.05 % cream     . dicyclomine (BENTYL) 10 MG capsule Take 1 capsule (10 mg total) by mouth 2 (two) times daily before a meal. 60 capsule 1  . metroNIDAZOLE (FLAGYL) 250 MG tablet Take 1 tablet (250 mg total) by mouth 3 (three) times daily. 30 tablet 0  . mirtazapine (REMERON) 15 MG tablet Take 1 tablet (15 mg total) by mouth at bedtime. 30 tablet 2  . Multiple Vitamin (MULTIVITAMIN WITH MINERALS) TABS Take 1 tablet by mouth every morning.    . rifaximin (XIFAXAN) 550 MG TABS tablet Use one by mouth three times a day for 14 days 42 tablet 0  . venlafaxine XR (EFFEXOR-XR) 75 MG 24 hr capsule Take 1 capsule (75 mg total) by mouth daily with breakfast. 30 capsule 2   No current facility-administered medications on file prior to visit.   Review of Systems  Constitutional: Negative for unusual diaphoresis or night sweats HENT: Negative for ringing in ear or discharge Eyes: Negative for double vision or worsening visual disturbance.  Respiratory: Negative for choking and stridor.   Gastrointestinal: Negative for vomiting or other signifcant bowel change Genitourinary: Negative for hematuria or change in urine volume.  Musculoskeletal: Negative for other MSK  pain or swelling Skin: Negative for color change and worsening wound.  Neurological: Negative for tremors and numbness other than noted  Psychiatric/Behavioral: Negative for decreased concentration or agitation other than above       Objective:   Physical Exam BP 128/82 mmHg  Pulse 96  Temp(Src) 98.2 F (36.8 C) (Oral)  Ht 5\' 6"  (1.676 m)  Wt 232 lb (105.235 kg)  BMI 37.46 kg/m2  SpO2 96% VS noted, not ill appaering Constitutional: Pt appears in no significant distress HENT: Head: NCAT.  Right Ear: External ear normal.  Left Ear: External ear normal.  Bilat wax  impactions irrigated and cleared without further erythema, swelling, d/c Eyes: . Pupils are equal, round, and reactive to light. Conjunctivae and EOM are normal Neck: Normal range of motion. Neck supple.  Cardiovascular: Normal rate and regular rhythm.   Pulmonary/Chest: Effort normal and breath sounds without rales or wheezing.  Neurological: Pt is alert. Not confused , motor grossly intact Skin: Skin is warm. No rash, no LE edema Psychiatric: Pt behavior is normal. No agitation.      Assessment & Plan:

## 2015-03-10 NOTE — Progress Notes (Signed)
Pre visit review using our clinic review tool, if applicable. No additional management support is needed unless otherwise documented below in the visit note. 

## 2015-03-10 NOTE — Patient Instructions (Signed)
Your ears were irrigated of wax today  Please continue all other medications as before, and refills have been done if requested.  Please have the pharmacy call with any other refills you may need.  Please continue your efforts at being more active, low cholesterol diet, and weight control.  You are otherwise up to date with prevention measures today.  Please keep your appointments with your specialists as you may have planned  Please go to the LAB in the Basement (turn left off the elevator) for the tests to be done today  You will be contacted by phone if any changes need to be made immediately.  Otherwise, you will receive a letter about your results with an explanation, but please check with MyChart first.  Please remember to sign up for MyChart if you have not done so, as this will be important to you in the future with finding out test results, communicating by private email, and scheduling acute appointments online when needed.  Please return in 1 year for your yearly visit to Dr Alain Marion, or sooner if advised otherwise per Dr Alain Marion

## 2015-03-13 NOTE — Assessment & Plan Note (Signed)
stable overall by history and exam, recent data reviewed with pt, and pt to continue medical treatment as before,  to f/u any worsening symptoms or concerns Lab Results  Component Value Date   HGBA1C 5.6 03/10/2015

## 2015-03-13 NOTE — Assessment & Plan Note (Signed)
With bilat hearing loss improved s/p irrigation, no further tx needed,  to f/u any worsening symptoms or concerns

## 2015-03-13 NOTE — Assessment & Plan Note (Signed)
stable overall by history and exam, recent data reviewed with pt, and pt to continue medical treatment as before,  to f/u any worsening symptoms or concerns Lab Results  Component Value Date   LDLCALC 130* 03/10/2015   Pt requests lab f/u, f/u with PCP

## 2015-03-31 ENCOUNTER — Encounter (HOSPITAL_COMMUNITY): Payer: Self-pay | Admitting: Psychiatry

## 2015-03-31 ENCOUNTER — Ambulatory Visit (INDEPENDENT_AMBULATORY_CARE_PROVIDER_SITE_OTHER): Payer: Medicare Other | Admitting: Psychiatry

## 2015-03-31 VITALS — BP 128/82 | HR 91 | Ht 66.0 in | Wt 234.0 lb

## 2015-03-31 DIAGNOSIS — F959 Tic disorder, unspecified: Secondary | ICD-10-CM | POA: Diagnosis not present

## 2015-03-31 DIAGNOSIS — F952 Tourette's disorder: Secondary | ICD-10-CM

## 2015-03-31 DIAGNOSIS — F429 Obsessive-compulsive disorder, unspecified: Secondary | ICD-10-CM

## 2015-03-31 DIAGNOSIS — F419 Anxiety disorder, unspecified: Secondary | ICD-10-CM

## 2015-03-31 MED ORDER — ARIPIPRAZOLE 5 MG PO TABS: 5.0000 mg | ORAL_TABLET | Freq: Every day | ORAL | Status: DC

## 2015-03-31 MED ORDER — VENLAFAXINE HCL ER 75 MG PO CP24: 75.0000 mg | ORAL_CAPSULE | Freq: Every day | ORAL | Status: DC

## 2015-03-31 MED ORDER — MIRTAZAPINE 15 MG PO TABS: 15.0000 mg | ORAL_TABLET | Freq: Every day | ORAL | Status: DC

## 2015-03-31 NOTE — Progress Notes (Signed)
Bruce Ellison  Bruce Ellison ZB:35 y.o.  03/31/2015 3:38 PM  Chief Complaint: Medication management and follow-up.    History of Present Illness:  Bruce Ellison came for his appointment.  He is taking his medication as prescribed.  His anxiety overall stable and he denies any major panic attack.  He sleeping good.  He denies any agitation, anger, mood swing or any crying spells.  He denies any feeling of hopelessness or worthlessness.  His sleep is good.  He had a good Christmas.  He wants to continue his current psychiatric medication.  He has no concern.  He like to continue Effexor, Remeron and Abilify .  Patient denies drinking or using any illegal substances.  Recently he seen his primary care physician and there has been no changes.  His appetite is okay.  His vitals are stable.  He lives with his mother and grandfather.  His grandmother died last 04-10-2022.    Suicidal Ideation: No Plan Formed: No Patient has means to carry out plan: No  Homicidal Ideation: No Plan Formed: No Patient has means to carry out plan: No  Review of Systems: Psychiatric: Agitation: No Hallucination: No Depressed Mood: No Insomnia: No Hypersomnia: No Altered Concentration: No Feels Worthless: No Grandiose Ideas: No Belief In Special Powers: No New/Increased Substance Abuse: No Compulsions: No  Review of Systems  Constitutional: Negative.   Musculoskeletal: Negative.   Skin: Negative for itching and rash.  Neurological: Negative for dizziness and tremors.  Psychiatric/Behavioral: Negative for depression, suicidal ideas, hallucinations and substance abuse. The patient does not have insomnia.      Past Medical History  Diagnosis Date  . Anxiety   . OCD (obsessive compulsive disorder)   . Personality disorder     with schizoid features  . Tourette's     per [facility]  . Insomnia   . HTN (hypertension)     borderline  . Obesity   . Allergic rhinitis   .  Glucose intolerance (impaired glucose tolerance)   . GERD (gastroesophageal reflux disease)   . Vitamin D deficiency   . Depression   . Sleep apnea   . Adenomatous colon polyp     tubular   His primary care physician is Dr. Christianne Borrow.   Social history.   Patient was born in [place], [place].  At age 14 his parents got divorced.  He moved the [place] at age 89.  He is living with his mother and grandparents.  Patient father still lives in [place].  Patient has no contact with his father.  Patient has never married.  He has no children.  He has limited social network.  Outpatient Encounter Prescriptions as of 03/31/2015  Medication Sig  . ARIPiprazole (ABILIFY) 5 MG tablet Take 1 tablet (5 mg total) by mouth daily.  [name] desonide (DESOWEN) 0.05 % cream   . dicyclomine (BENTYL) 10 MG capsule Take 1 capsule (10 mg total) by mouth 2 (two) times daily before a meal.  . metroNIDAZOLE (FLAGYL) 250 MG tablet Take 1 tablet (250 mg total) by mouth 3 (three) times daily.  . mirtazapine (REMERON) 15 MG tablet Take 1 tablet (15 mg total) by mouth at bedtime.  . Multiple Vitamin (MULTIVITAMIN WITH MINERALS) TABS Take 1 tablet by mouth every morning.  . rifaximin (XIFAXAN) 550 MG TABS tablet Use one by mouth three times a day for 14 days  . venlafaxine XR (EFFEXOR-XR) 75 MG 24 hr capsule Take 1 capsule (75 mg  total) by mouth daily with breakfast.  . [DISCONTINUED] ARIPiprazole (ABILIFY) 5 MG tablet Take 1 tablet (5 mg total) by mouth daily.  . [DISCONTINUED] mirtazapine (REMERON) 15 MG tablet Take 1 tablet (15 mg total) by mouth at bedtime.  . [DISCONTINUED] venlafaxine XR (EFFEXOR-XR) 75 MG 24 hr capsule Take 1 capsule (75 mg total) by mouth daily with breakfast.   No facility-administered encounter medications on file as of 03/31/2015.    Past Psychiatric History/Hospitalization(s): Anxiety: Patient has history of psychiatric illness at age 42.  At that time he was in [place].  He has Tic, anxiety and  anger. Bipolar Disorder: No Depression: Yes, he had tried Zoloft, Prozac and recently Luvox. Mania: No Psychosis: Patient has history of paranoia.  In the past he had tried Geodon, Risperdal and Seroquel.  He was seeing psychiatrist at [place].  In the past he was also to get treatment in [place].  He was seeing psychiatrist in [place] in [place].  He&amp;#39;s been taking the medication since age 59.  Recently his been seeing Dr. Toy Care who had prescribed her Zoloft, Valium, Klonopin, Xanax, Prozac and Ambien.  We also tried luvox however he developed more irritability. Schizophrenia: No Personality Disorder: No Hospitalization for psychiatric illness: No History of Electroconvulsive Shock Therapy: No Prior Suicide Attempts: No  Physical Exam: Constitutional:  BP 128/82 mmHg  Pulse 91  Ht 5\' 6"  (1.676 m)  Wt 234 lb (106.142 kg)  BMI 37.79 kg/m2  Recent Results (from the past 2160 hour(s))  Varicella zoster antibody, IgM     Status: None   Collection Time: 01/11/15  9:05 AM  Result Value Ref Range   Varicella Zoster Ab IgM 0.11 <0.91 ISR    Comment:      ISR = Immune Status Ratio              <0.91         ISR       Negative              0.91 - 1.09   ISR       Equivocal              >=1.10        ISR       Positive   Results from any one IgM assay should not be used as the sole determinant of a current or recent infection. Because an IgM test can yield false positive results, and low levels of IgM antibody may persist for more than 12 months post infection, reliance on a single test result could be misleading. If an acute infection is suspected, consider obtaining a new specimen and submit for both IgG and IgM testing in two or more weeks.   Hemoglobin A1c     Status: None   Collection Time: 03/10/15  3:17 PM  Result Value Ref Range   Hgb A1c MFr Bld 5.6 4.6 - 6.5 %    Comment: Glycemic Control Guidelines for People with Diabetes:Non Diabetic:  <6%Goal  of Therapy: <7%Additional Action Suggested:  >8%   Lipid panel     Status: Abnormal   Collection Time: 03/10/15  3:17 PM  Result Value Ref Range   Cholesterol 193 0 - 200 mg/dL    Comment: ATP III Classification       Desirable:  < 200 mg/dL               Borderline High:  200 - 239 mg/dL  High:  > = 240 mg/dL   Triglycerides 104.0 0.0 - 149.0 mg/dL    Comment: Normal:  <150 mg/dLBorderline High:  150 - 199 mg/dL   HDL 42.40 >39.00 mg/dL   VLDL 20.8 0.0 - 40.0 mg/dL   LDL Cholesterol 130 (H) 0 - 99 mg/dL   Total CHOL/HDL Ratio 5     Comment:                Men          Women1/2 Average Risk     3.4          3.3Average Risk          5.0          4.42X Average Risk          9.6          7.13X Average Risk          15.0          11.0                       NonHDL 150.52     Comment: Ellison:  Non-HDL goal should be 30 mg/dL higher than patient's LDL goal (i.e. LDL goal of < 70 mg/dL, would have non-HDL goal of < 100 mg/dL)  Basic metabolic panel     Status: Abnormal   Collection Time: 03/10/15  3:17 PM  Result Value Ref Range   Sodium 141 135 - 145 mEq/L   Potassium 4.3 3.5 - 5.1 mEq/L   Chloride 104 96 - 112 mEq/L   CO2 29 19 - 32 mEq/L   Glucose, Bld 111 (H) 70 - 99 mg/dL   BUN 10 6 - 23 mg/dL   Creatinine, Ser 0.89 0.40 - 1.50 mg/dL   Calcium 9.9 8.4 - 10.5 mg/dL   GFR 103.65 >60.00 mL/min  Hepatic function panel     Status: Abnormal   Collection Time: 03/10/15  3:17 PM  Result Value Ref Range   Total Bilirubin 0.5 0.2 - 1.2 mg/dL   Bilirubin, Direct 0.1 0.0 - 0.3 mg/dL   Alkaline Phosphatase 78 39 - 117 U/L   AST 27 0 - 37 U/L   ALT 54 (H) 0 - 53 U/L   Total Protein 7.4 6.0 - 8.3 g/dL   Albumin 4.7 3.5 - 5.2 g/dL  CBC with Differential/Platelet     Status: Abnormal   Collection Time: 03/10/15  3:17 PM  Result Value Ref Range   WBC 6.4 4.0 - 10.5 K/uL   RBC 5.76 4.22 - 5.81 Mil/uL   Hemoglobin 17.0 13.0 - 17.0 g/dL   HCT 51.3 39.0 - 52.0 %   MCV 89.1 78.0 - 100.0 fl    MCHC 33.2 30.0 - 36.0 g/dL   RDW 13.3 11.5 - 15.5 %   Platelets 273.0 150.0 - 400.0 K/uL   Neutrophils Relative % 56.5 43.0 - 77.0 %   Lymphocytes Relative 25.5 12.0 - 46.0 %   Monocytes Relative 9.7 3.0 - 12.0 %   Eosinophils Relative 7.8 (H) 0.0 - 5.0 %   Basophils Relative 0.5 0.0 - 3.0 %   Neutro Abs 3.6 1.4 - 7.7 K/uL   Lymphs Abs 1.6 0.7 - 4.0 K/uL   Monocytes Absolute 0.6 0.1 - 1.0 K/uL   Eosinophils Absolute 0.5 0.0 - 0.7 K/uL   Basophils Absolute 0.0 0.0 - 0.1 K/uL  TSH     Status: None   Collection Time:  03/10/15  3:17 PM  Result Value Ref Range   TSH 1.09 0.35 - 4.50 uIU/mL  Urinalysis, Routine w reflex microscopic (not at [facility])     Status: None   Collection Time: 03/10/15  3:17 PM  Result Value Ref Range   Color, Urine YELLOW Yellow;Lt. Yellow   APPearance CLEAR Clear   Specific Gravity, Urine 1.020 1.000-1.030   pH 7.0 5.0 - 8.0   Total Protein, Urine NEGATIVE Negative   Urine Glucose NEGATIVE Negative   Ketones, ur NEGATIVE Negative   Bilirubin Urine NEGATIVE Negative   Hgb urine dipstick NEGATIVE Negative   Urobilinogen, UA 0.2 0.0 - 1.0   Leukocytes, UA NEGATIVE Negative   Nitrite NEGATIVE Negative   RBC / HPF none seen 0-2/hpf   Squamous Epithelial / LPF Rare(0-4/hpf) Rare(0-4/hpf)  PSA     Status: None   Collection Time: 03/10/15  3:17 PM  Result Value Ref Range   PSA 0.65 0.10 - 4.00 ng/mL    General Appearance: well nourished  Musculoskeletal: Strength & Muscle Tone: within normal limits Gait & Station: normal Patient leans: N/A  Mental status examination: Patient is casually dressed and groomed.  He is cooperative and maintained good eye contact.  He appears anxious and described his mood euthymic and affect appropriate. His speech is clear and coherent.  He denies any auditory or visual hallucination.  He denies any active or passive suicidal thoughts or homicidal thought.  There were no delusions or any paranoia.  His psychomotor activity is  normal.  He has no flight of ideas or any loose association.  His attention and concentration is fair.  His cognition is intact.  His fund of knowledge is adequate.  He is alert and oriented 3.  His insight judgment and impulse control is okay.  Established Problem, Stable/Improving (1), Review or order clinical lab tests (1), Review of Last Therapy Session (1) and Review of Medication Regimen & Side Effects (2)  Assessment: Axis I: Obsessive-compulsive disorder, Tourette's syndrome, Tic disorder, anxiety disorder NOS  Axis II: Deferred  Axis III: See medical history Patient Active Problem List   Diagnosis Date Noted  . Hyperlipidemia 03/10/2015  . Nonallopathic lesion-rib cage 12/31/2013  . Nonallopathic lesion of lumbosacral region 12/31/2013  . Sleep difficulties 12/28/2013  . Diarrhea 10/10/2013  . Cervical disc disorder with radiculopathy of cervical region 06/11/2013  . Acute thoracic back pain 05/28/2013  . Muscle spasm of back 03/24/2013  . Nonallopathic lesion of thoracic region 03/24/2013  . Finger wound, simple, open 12/08/2012  . Abdominal distention 12/06/2012  . Sinusitis nasal 06/04/2012  . Hyperglycemia 06/04/2012  . Weight gain 06/04/2012  . Major depressive disorder, recurrent episode, severe, without mention of psychotic behavior 05/28/2012  . Generalized anxiety disorder 05/28/2012  . Varicose veins of lower extremities with other complications [id number]  . OSA (obstructive sleep apnea) 02/14/2011  . Polycythemia 12/14/2010  . Cerumen impaction 06/24/2010  . VITAMIN D DEFICIENCY 12/25/2008  . SEBORRHEIC DERMATITIS 07/23/2008  . FREQUENCY, URINARY 07/23/2008  . CELLULITIS/ABSCESS, FOOT 09/11/2007  . GLUCOSE INTOLERANCE 04/11/2007  . ALLERGIC RHINITIS 04/11/2007  . GERD 04/11/2007  . Backache 04/11/2007    Plan:   Patient is stable on his current medication.  He has no concern.  He has no EPS or shakes.  Review his blood work.  His live-in A1c is normal.   I will continue Abilify 5 mg daily, Remeron 15 mg at bedtime and Effexor 75 mg daily.  Discussed medication side effects and  benefits.  Recommended to call us back if he has any question or any concern.  Follow-up in 3 months.  Felita Bump T., MD 03/31/2015

## 2015-05-03 DIAGNOSIS — N138 Other obstructive and reflux uropathy: Secondary | ICD-10-CM | POA: Diagnosis not present

## 2015-05-03 DIAGNOSIS — R351 Nocturia: Secondary | ICD-10-CM | POA: Diagnosis not present

## 2015-05-03 DIAGNOSIS — N401 Enlarged prostate with lower urinary tract symptoms: Secondary | ICD-10-CM | POA: Diagnosis not present

## 2015-05-03 DIAGNOSIS — R3913 Splitting of urinary stream: Secondary | ICD-10-CM | POA: Diagnosis not present

## 2015-05-03 DIAGNOSIS — Z0000 Encounter for general adult medical examination without abnormal findings: Secondary | ICD-10-CM | POA: Diagnosis not present

## 2015-06-30 ENCOUNTER — Ambulatory Visit (HOSPITAL_COMMUNITY): Payer: Self-pay | Admitting: Psychiatry

## 2015-07-08 ENCOUNTER — Ambulatory Visit: Payer: Self-pay | Admitting: Internal Medicine

## 2015-07-08 ENCOUNTER — Encounter (HOSPITAL_COMMUNITY): Payer: Self-pay | Admitting: Psychiatry

## 2015-07-08 ENCOUNTER — Ambulatory Visit (INDEPENDENT_AMBULATORY_CARE_PROVIDER_SITE_OTHER): Payer: Medicare Other | Admitting: Psychiatry

## 2015-07-08 VITALS — BP 112/70 | HR 74 | Ht 66.0 in | Wt 230.0 lb

## 2015-07-08 DIAGNOSIS — F429 Obsessive-compulsive disorder, unspecified: Secondary | ICD-10-CM

## 2015-07-08 DIAGNOSIS — F952 Tourette's disorder: Secondary | ICD-10-CM

## 2015-07-08 MED ORDER — VENLAFAXINE HCL ER 75 MG PO CP24: 75.0000 mg | ORAL_CAPSULE | Freq: Every day | ORAL | Status: DC

## 2015-07-08 MED ORDER — ARIPIPRAZOLE 5 MG PO TABS: 5.0000 mg | ORAL_TABLET | Freq: Every day | ORAL | Status: DC

## 2015-07-08 MED ORDER — MIRTAZAPINE 15 MG PO TABS: 15.0000 mg | ORAL_TABLET | Freq: Every day | ORAL | Status: DC

## 2015-07-08 NOTE — Progress Notes (Signed)
[facility] [place] Progress Note  Bruce Ellison 35 y.o.  07/08/2015 3:47 PM  Chief Complaint: Medication management and follow-up.    History of Present Illness:  Bruce Ellison came for his appointment.  He is compliant with the medication and reported no side effects.  His OCD and anxiety is under control.  He sleeping good.  There has been no changes in his current medication.  He reported no side effects.  He denies any feeling of hopelessness or worthlessness.  His energy level is good.  His vitals are stable.  He has no tremors, shakes or any EPS.  He denies any crying spells or any aggressive behavior. He like to continue Effexor, Remeron and Abilify .  Patient denies drinking or using any illegal substances.  His appetite is okay.  His vitals are stable.  He lives with his mother and grandfather.  His grandmother died last 04/14/22.    Suicidal Ideation: No Plan Formed: No Patient has means to carry out plan: No  Homicidal Ideation: No Plan Formed: No Patient has means to carry out plan: No  Review of Systems: Psychiatric: Agitation: No Hallucination: No Depressed Mood: No Insomnia: No Hypersomnia: No Altered Concentration: No Feels Worthless: No Grandiose Ideas: No Belief In Special Powers: No New/Increased Substance Abuse: No Compulsions: No  Review of Systems  Constitutional: Negative.   Musculoskeletal: Negative.   Skin: Negative for itching and rash.  Neurological: Negative for dizziness and tremors.  Psychiatric/Behavioral: Negative for depression, suicidal ideas, hallucinations and substance abuse. The patient does not have insomnia.      Past Medical History  Diagnosis Date  . Anxiety   . OCD (obsessive compulsive disorder)   . Personality disorder     with schizoid features  . Tourette's     per [facility]  . Insomnia   . HTN (hypertension)     borderline  . Obesity   . Allergic rhinitis   . Glucose intolerance (impaired glucose  tolerance)   . GERD (gastroesophageal reflux disease)   . Vitamin D deficiency   . Depression   . Sleep apnea   . Adenomatous colon polyp     tubular   His primary care physician is Dr. Christianne Borrow.   Social history.   Patient was born in [place], [place].  At age 27 his parents got divorced.  He moved the [place] at age 52.  He is living with his mother and grandparents.  Patient father still lives in [place].  Patient has no contact with his father.  Patient has never married.  He has no children.  He has limited social network.  Outpatient Encounter Prescriptions as of 07/08/2015  Medication Sig  . ARIPiprazole (ABILIFY) 5 MG tablet Take 1 tablet (5 mg total) by mouth daily.  [name] desonide (DESOWEN) 0.05 % cream   . dicyclomine (BENTYL) 10 MG capsule Take 1 capsule (10 mg total) by mouth 2 (two) times daily before a meal.  . mirtazapine (REMERON) 15 MG tablet Take 1 tablet (15 mg total) by mouth at bedtime.  . Multiple Vitamin (MULTIVITAMIN WITH MINERALS) TABS Take 1 tablet by mouth every morning.  . rifaximin (XIFAXAN) 550 MG TABS tablet Use one by mouth three times a day for 14 days  . venlafaxine XR (EFFEXOR-XR) 75 MG 24 hr capsule Take 1 capsule (75 mg total) by mouth daily with breakfast.  . [DISCONTINUED] ARIPiprazole (ABILIFY) 5 MG tablet Take 1 tablet (5 mg total) by mouth daily.  . [  DISCONTINUED] metroNIDAZOLE (FLAGYL) 250 MG tablet Take 1 tablet (250 mg total) by mouth 3 (three) times daily.  . [DISCONTINUED] mirtazapine (REMERON) 15 MG tablet Take 1 tablet (15 mg total) by mouth at bedtime.  . [DISCONTINUED] venlafaxine XR (EFFEXOR-XR) 75 MG 24 hr capsule Take 1 capsule (75 mg total) by mouth daily with breakfast.   No facility-administered encounter medications on file as of 07/08/2015.    Past Psychiatric History/Hospitalization(s): Anxiety: Patient has history of psychiatric illness at age 44.  At that time he was in [place].  He has Tic, anxiety and anger. Bipolar Disorder:  No Depression: Yes, he had tried Zoloft, Prozac and recently Luvox. Mania: No Psychosis: Patient has history of paranoia.  In the past he had tried Geodon, Risperdal and Seroquel.  He was seeing psychiatrist at [place].  In the past he was also to get treatment in [place].  He was seeing psychiatrist in [place] in [place].  He&amp;#39;s been taking the medication since age 73.  Recently his been seeing Dr. Toy Care who had prescribed her Zoloft, Valium, Klonopin, Xanax, Prozac and Ambien.  We also tried luvox however he developed more irritability. Schizophrenia: No Personality Disorder: No Hospitalization for psychiatric illness: No History of Electroconvulsive Shock Therapy: No Prior Suicide Attempts: No  Physical Exam: Constitutional:  BP 112/70 mmHg  Pulse 74  Ht 5\' 6"  (1.676 m)  Wt 230 lb (104.327 kg)  BMI 37.14 kg/m2  No results found for this or any previous visit (from the past 2160 hour(s)).  General Appearance: well nourished  Musculoskeletal: Strength & Muscle Tone: within normal limits Gait & Station: normal Patient leans: N/A  Mental status examination: Patient is casually dressed and groomed.  He is cooperative and maintained good eye contact.  He appears anxious and described his mood euthymic and affect appropriate. His speech is clear and coherent.  He denies any auditory or visual hallucination.  He denies any active or passive suicidal thoughts or homicidal thought.  There were no delusions or any paranoia.  His psychomotor activity is normal.  He has no flight of ideas or any loose association.  His attention and concentration is fair.  His cognition is intact.  His fund of knowledge is adequate.  He is alert and oriented 3.  His insight judgment and impulse control is okay.  Established Problem, Stable/Improving (1), Review or order clinical lab tests (1), Review of Last Therapy Session (1) and Review of Medication Regimen & Side Effects  (2)  Assessment: Axis I: Obsessive-compulsive disorder, Tourette's syndrome, Tic disorder, anxiety disorder NOS  Axis II: Deferred  Axis III: See medical history Patient Active Problem List   Diagnosis Date Noted  . Hyperlipidemia 03/10/2015  . Nonallopathic lesion-rib cage 12/31/2013  . Nonallopathic lesion of lumbosacral region 12/31/2013  . Sleep difficulties 12/28/2013  . Diarrhea 10/10/2013  . Cervical disc disorder with radiculopathy of cervical region 06/11/2013  . Acute thoracic back pain 05/28/2013  . Muscle spasm of back 03/24/2013  . Nonallopathic lesion of thoracic region 03/24/2013  . Finger wound, simple, open 12/08/2012  . Abdominal distention 12/06/2012  . Sinusitis nasal 06/04/2012  . Hyperglycemia 06/04/2012  . Weight gain 06/04/2012  . Major depressive disorder, recurrent episode, severe, without mention of psychotic behavior 05/28/2012  . Generalized anxiety disorder 05/28/2012  . Varicose veins of lower extremities with other complications [id number]  . OSA (obstructive sleep apnea) 02/14/2011  . Polycythemia 12/14/2010  . Cerumen impaction 06/24/2010  . VITAMIN D DEFICIENCY  12/25/2008  . SEBORRHEIC DERMATITIS 07/23/2008  . FREQUENCY, URINARY 07/23/2008  . CELLULITIS/ABSCESS, FOOT 09/11/2007  . GLUCOSE INTOLERANCE 04/11/2007  . ALLERGIC RHINITIS 04/11/2007  . GERD 04/11/2007  . Backache 04/11/2007    Plan:   Patient is stable on his current medication.  He reported no EPS or shakes. I will continue Abilify 5 mg daily, Remeron 15 mg at bedtime and Effexor 75 mg daily.  Discussed medication side effects and benefits.  Recommended to call us back if he has any question or any concern.  Follow-up in 3 months.  Manfred Laspina T., MD 07/08/2015

## 2015-07-21 DIAGNOSIS — H40052 Ocular hypertension, left eye: Secondary | ICD-10-CM | POA: Diagnosis not present

## 2015-07-21 DIAGNOSIS — H40051 Ocular hypertension, right eye: Secondary | ICD-10-CM | POA: Diagnosis not present

## 2015-08-18 ENCOUNTER — Ambulatory Visit (INDEPENDENT_AMBULATORY_CARE_PROVIDER_SITE_OTHER): Payer: Medicare Other | Admitting: Internal Medicine

## 2015-08-18 ENCOUNTER — Encounter: Payer: Self-pay | Admitting: Internal Medicine

## 2015-08-18 VITALS — BP 122/78 | HR 100 | Temp 98.5°F | Wt 228.0 lb

## 2015-08-18 DIAGNOSIS — J020 Streptococcal pharyngitis: Secondary | ICD-10-CM | POA: Diagnosis not present

## 2015-08-18 DIAGNOSIS — J029 Acute pharyngitis, unspecified: Secondary | ICD-10-CM

## 2015-08-18 LAB — POCT RAPID STREP A (OFFICE): RAPID STREP A SCREEN: NEGATIVE

## 2015-08-18 NOTE — Progress Notes (Signed)
Pre visit review using our clinic review tool, if applicable. No additional management support is needed unless otherwise documented below in the visit note. 

## 2015-08-18 NOTE — Patient Instructions (Signed)
Use over-the-counter  "cold" medicines  such as "Tylenol cold" , "Advil cold",  "Mucinex" or" Mucinex D"  for cough and congestion.   Avoid decongestants if you have high blood pressure and use "Afrin" nasal spray for nasal congestion as directed instead. Use" Delsym" or" Robitussin" cough syrup varietis for cough.  You can use plain "Tylenol" or "Advil" for fever, chills and achyness. Use Halls or Ricola cough drops.   "Common cold" symptoms are usually triggered by a virus.  The antibiotics are usually not necessary. On average, a" viral cold" illness would take 4-7 days to resolve. Please, make an appointment if you are not better or if you're worse.  

## 2015-08-19 DIAGNOSIS — J029 Acute pharyngitis, unspecified: Secondary | ICD-10-CM | POA: Insufficient documentation

## 2015-08-19 NOTE — Progress Notes (Signed)
Subjective:  Patient ID: Bruce Ellison, male    DOB: February 27, 1981  Age: 35 y.o. MRN: [id number]  CC: No chief complaint on file.   HPI Bruce Ellison presents for ST <1 day  Outpatient Prescriptions Prior to Visit  Medication Sig Dispense Refill  . ARIPiprazole (ABILIFY) 5 MG tablet Take 1 tablet (5 mg total) by mouth daily. 30 tablet 2  . desonide (DESOWEN) 0.05 % cream     . mirtazapine (REMERON) 15 MG tablet Take 1 tablet (15 mg total) by mouth at bedtime. 30 tablet 2  . Multiple Vitamin (MULTIVITAMIN WITH MINERALS) TABS Take 1 tablet by mouth every morning.    . venlafaxine XR (EFFEXOR-XR) 75 MG 24 hr capsule Take 1 capsule (75 mg total) by mouth daily with breakfast. 30 capsule 2  . dicyclomine (BENTYL) 10 MG capsule Take 1 capsule (10 mg total) by mouth 2 (two) times daily before a meal. (Patient not taking: Reported on 08/18/2015) 60 capsule 1  . rifaximin (XIFAXAN) 550 MG TABS tablet Use one by mouth three times a day for 14 days (Patient not taking: Reported on 08/18/2015) 42 tablet 0   No facility-administered medications prior to visit.    ROS Review of Systems  Constitutional: Negative for appetite change, fatigue and unexpected weight change.  HENT: Positive for sore throat. Negative for congestion, nosebleeds, sneezing and trouble swallowing.   Eyes: Negative for itching and visual disturbance.  Respiratory: Negative for cough.   Cardiovascular: Negative for chest pain, palpitations and leg swelling.  Gastrointestinal: Negative for nausea, diarrhea, blood in stool and abdominal distention.  Genitourinary: Negative for frequency and hematuria.  Musculoskeletal: Negative for back pain, joint swelling, gait problem and neck pain.  Skin: Negative for rash.  Neurological: Negative for dizziness, tremors, speech difficulty and weakness.  Psychiatric/Behavioral: Negative for sleep disturbance, dysphoric mood and agitation. The patient is not nervous/anxious.     Objective:  BP  122/78 mmHg  Pulse 100  Temp(Src) 98.5 F (36.9 C) (Oral)  Wt 228 lb (103.42 kg)  SpO2 95%  BP Readings from Last 3 Encounters:  08/18/15 122/78  07/08/15 112/70  03/31/15 128/82    Wt Readings from Last 3 Encounters:  08/18/15 228 lb (103.42 kg)  07/08/15 230 lb (104.327 kg)  03/31/15 234 lb (106.142 kg)    Physical Exam  Constitutional: He is oriented to person, place, and time. He appears well-developed. No distress.  NAD  HENT:  Mouth/Throat: No oropharyngeal exudate.  Eyes: Conjunctivae are normal. Pupils are equal, round, and reactive to light. Left eye exhibits no discharge.  Neck: Normal range of motion. No JVD present. No thyromegaly present.  Cardiovascular: Normal rate, regular rhythm, normal heart sounds and intact distal pulses.  Exam reveals no gallop and no friction rub.   No murmur heard. Pulmonary/Chest: Effort normal and breath sounds normal. No respiratory distress. He has no wheezes. He has no rales. He exhibits no tenderness.  Abdominal: Soft. Bowel sounds are normal. He exhibits no distension and no mass. There is no tenderness. There is no rebound and no guarding.  Musculoskeletal: Normal range of motion. He exhibits no edema or tenderness.  Lymphadenopathy:    He has no cervical adenopathy.  Neurological: He is alert and oriented to person, place, and time. He has normal reflexes. No cranial nerve deficit. He exhibits normal muscle tone. He displays a negative Romberg sign. Coordination and gait normal.  Skin: Skin is warm and dry. No rash noted.  Psychiatric: He  has a normal mood and affect. His behavior is normal. Judgment and thought content normal.  eryth throat  Strep (-)  Lab Results  Component Value Date   WBC 6.4 03/10/2015   HGB 17.0 03/10/2015   HCT 51.3 03/10/2015   PLT 273.0 03/10/2015   GLUCOSE 111* 03/10/2015   CHOL 193 03/10/2015   TRIG 104.0 03/10/2015   HDL 42.40 03/10/2015   LDLDIRECT 136.9 08/30/2010   LDLCALC 130*  03/10/2015   ALT 54* 03/10/2015   AST 27 03/10/2015   NA 141 03/10/2015   K 4.3 03/10/2015   CL 104 03/10/2015   CREATININE 0.89 03/10/2015   BUN 10 03/10/2015   CO2 29 03/10/2015   TSH 1.09 03/10/2015   PSA 0.65 03/10/2015   HGBA1C 5.6 03/10/2015    US Abdomen Complete  12/16/2013  CLINICAL DATA:  35 year old male with recurrent right upper quadrant abdominal pain. Subsequent encounter. EXAM: ULTRASOUND ABDOMEN COMPLETE COMPARISON:  Thoracic spine MRI 06/04/2013. FINDINGS: Gallbladder: No gallstones or wall thickening visualized. No sonographic Murphy sign noted. Common bile duct: Diameter: 5 mm, normal Liver: No focal lesion identified. Within normal limits in parenchymal echogenicity. No intrahepatic biliary ductal dilatation. IVC: No abnormality visualized. Pancreas: Visualized portion unremarkable. Spleen: Size and appearance within normal limits. Right Kidney: Length: 11.7 cm. Echogenicity within normal limits. No mass or hydronephrosis visualized. Left Kidney: Length: 12.1 cm. Small midpole region cortical cyst measuring up to 10 mm appears simple and unchanged. Echogenicity within normal limits. No mass or hydronephrosis visualized. Abdominal aorta: No aneurysm visualized. Other findings: None. IMPRESSION: Normal gallbladder.  Normal abdominal ultrasound. Electronically Signed   By: Lars Pinks M.D.   On: 12/16/2013 09:13    Assessment & Plan:   Diagnoses and all orders for this visit:  Streptococcal sore throat -     POCT rapid strep A   I am having Mr. Pawelski maintain his multivitamin with minerals, desonide, rifaximin, dicyclomine, venlafaxine XR, mirtazapine, and ARIPiprazole.  No orders of the defined types were placed in this encounter.     Follow-up: No Follow-up on file.  Walker Kehr, MD

## 2015-08-19 NOTE — Assessment & Plan Note (Signed)
?  viral Strep test

## 2015-09-24 ENCOUNTER — Ambulatory Visit: Payer: Self-pay | Admitting: Internal Medicine

## 2015-10-06 ENCOUNTER — Ambulatory Visit (HOSPITAL_COMMUNITY): Payer: Self-pay | Admitting: Psychiatry

## 2015-10-12 ENCOUNTER — Other Ambulatory Visit (HOSPITAL_COMMUNITY): Payer: Self-pay | Admitting: Psychiatry

## 2015-10-12 DIAGNOSIS — F429 Obsessive-compulsive disorder, unspecified: Secondary | ICD-10-CM

## 2015-12-01 ENCOUNTER — Ambulatory Visit (HOSPITAL_COMMUNITY): Payer: Self-pay | Admitting: Psychiatry

## 2015-12-02 ENCOUNTER — Encounter (HOSPITAL_COMMUNITY): Payer: Self-pay | Admitting: Psychiatry

## 2015-12-02 ENCOUNTER — Ambulatory Visit (INDEPENDENT_AMBULATORY_CARE_PROVIDER_SITE_OTHER): Payer: Medicare Other | Admitting: Psychiatry

## 2015-12-02 DIAGNOSIS — F429 Obsessive-compulsive disorder, unspecified: Secondary | ICD-10-CM | POA: Diagnosis not present

## 2015-12-02 MED ORDER — VENLAFAXINE HCL ER 75 MG PO CP24: 75.0000 mg | ORAL_CAPSULE | Freq: Every day | ORAL | 2 refills | Status: DC

## 2015-12-02 MED ORDER — ARIPIPRAZOLE 5 MG PO TABS: 5.0000 mg | ORAL_TABLET | Freq: Every day | ORAL | 2 refills | Status: DC

## 2015-12-02 MED ORDER — MIRTAZAPINE 15 MG PO TABS: 15.0000 mg | ORAL_TABLET | Freq: Every day | ORAL | 2 refills | Status: DC

## 2015-12-02 NOTE — Progress Notes (Signed)
[facility] Progress Note  Bruce Ellison [id number] 35 y.o.  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      History of Present Illness:  Bruce Ellison came for his appointment.  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  He like to keep himself busy.  He is taking his medication as prescribed.  He denies any major worsening of depression, OCD or anxiety attack.  He sleeping good.  He has no tremors, shakes or any EPS.  Recently he see his primary care physician and there has been no changes.  He wants to continue Remeron, Effexor and Abilify.  He has no tremors, shakes or any EPS.  Patient denies drinking alcohol or using any illegal substances.  His appetite is okay.  He lives with his mother and his grandfather.  Suicidal Ideation: No Plan Formed: No Patient has means to carry out plan: No  Homicidal Ideation: No Plan Formed: No Patient has means to carry out plan: No  Review of Systems: Psychiatric: Agitation: No Hallucination: No Depressed Mood: No Insomnia: No Hypersomnia: No Altered Concentration: No Feels Worthless: No Grandiose Ideas: No Belief In Special Powers: No New/Increased Substance Abuse: No Compulsions: No  Review of Systems  Constitutional: Negative.   Musculoskeletal: Negative.   Skin: Negative for itching and rash.  Neurological: Negative for dizziness and tremors.  Psychiatric/Behavioral: Negative for depression, hallucinations, substance abuse and suicidal ideas. The patient does not have insomnia.      Past Medical History:  Diagnosis Date  . Adenomatous colon polyp    tubular  . Allergic rhinitis   . Anxiety   . Depression   . GERD (gastroesophageal reflux disease)   . Glucose intolerance (impaired glucose tolerance)   . HTN (hypertension)    borderline  . Insomnia   . Obesity   . OCD (obsessive compulsive disorder)   . Personality disorder    with schizoid features  .  Sleep apnea   . Tourette's    per [facility]  . Vitamin D deficiency    His primary care physician is Dr. Christianne Borrow.   Social history.   Patient was born in [place], [place].  At age 44 his parents got divorced.  He moved the [place] at age 40.  He is living with his mother and grandparents.  Patient father still lives in [place].  Patient has no contact with his father.  Patient has never married.  He has no children.  He has limited social network.  Outpatient Encounter Prescriptions as of 12/02/2015  Medication Sig Dispense Refill  . ARIPiprazole (ABILIFY) 5 MG tablet Take 1 tablet (5 mg total) by mouth daily. 30 tablet 2  . desonide (DESOWEN) 0.05 % cream     . dicyclomine (BENTYL) 10 MG capsule Take 1 capsule (10 mg total) by mouth 2 (two) times daily before a meal. (Patient not taking: Reported on 08/18/2015) 60 capsule 1  . mirtazapine (REMERON) 15 MG tablet Take 1 tablet (15 mg total) by mouth at bedtime. 30 tablet 2  . Multiple Vitamin (MULTIVITAMIN WITH MINERALS) TABS Take 1 tablet by mouth every morning.    . rifaximin (XIFAXAN) 550 MG TABS tablet Use one by mouth three times a day for 14 days (Patient not taking: Reported on 08/18/2015) 42 tablet 0  . venlafaxine XR (EFFEXOR-XR) 75 MG 24 hr capsule Take 1 capsule (75 mg total) by mouth  daily with breakfast. 30 capsule 2  . [DISCONTINUED] ARIPiprazole (ABILIFY) 5 MG tablet TAKE 1 TABLET (5 MG TOTAL) BY MOUTH DAILY. 30 tablet 1  . [DISCONTINUED] mirtazapine (REMERON) 15 MG tablet TAKE 1 TABLET (15 MG TOTAL) BY MOUTH AT BEDTIME. 30 tablet 1  . [DISCONTINUED] venlafaxine XR (EFFEXOR-XR) 75 MG 24 hr capsule TAKE 1 CAPSULE (75 MG TOTAL) BY MOUTH DAILY WITH BREAKFAST. 30 capsule 1   No facility-administered encounter medications on file as of 12/02/2015.     Past Psychiatric History/Hospitalization(s): Anxiety: Patient has history of psychiatric illness at age 46.  At that time he was in [place].  He has Tic, anxiety and anger. Bipolar  Disorder: No Depression: Yes, he had tried Zoloft, Prozac and recently Luvox. Mania: No Psychosis: Patient has history of paranoia.  In the past he had tried Geodon, Risperdal and Seroquel.  He was seeing psychiatrist at [facility].  In the past he was also to get treatment in [place].  He was seeing psychiatrist in [place] in [place].  He&amp;#39;s been taking the medication since age 36.  Recently his been seeing Dr. Toy Care who had prescribed her Zoloft, Valium, Klonopin, Xanax, Prozac and Ambien.  We also tried luvox however he developed more irritability. Schizophrenia: No Personality Disorder: No Hospitalization for psychiatric illness: No History of Electroconvulsive Shock Therapy: No Prior Suicide Attempts: No  Physical Exam: Constitutional:  BP 129/82 (BP Location: Right Arm, Patient Position: Sitting, Cuff Size: Normal)   Pulse 98   Resp 12   Ht 5\' 5"  (1.651 m)   Wt 229 lb 12.8 oz (104.2 kg)   BMI 38.24 kg/m   No results found for this or any previous visit (from the past 2160 hour(s)).  General Appearance: well nourished  Musculoskeletal: Strength & Muscle Tone: within normal limits Gait & Station: normal Patient leans: N/A  Mental status examination: Patient is casually dressed and groomed.  He is cooperative and maintained good eye contact.  He is pleasant and maintained good eye contact.  He described his mood euthymic and his affect is appropriate.  His speech is clear and coherent.  He denies any auditory or visual hallucination.  He denies any active or passive suicidal thoughts or homicidal thought.  There were no delusions or any paranoia.  His psychomotor activity is normal.  He has no flight of ideas or any loose association.  His attention and concentration is fair.  His cognition is intact.  His fund of knowledge is adequate.  He is alert and oriented 3.  His insight judgment and impulse control is okay.  Established Problem, Stable/Improving  (1), Review or order clinical lab tests (1), Review of Last Therapy Session (1) and Review of Medication Regimen & Side Effects (2)  Assessment: Axis I: Obsessive-compulsive disorder, Tourette's syndrome, Tic disorder, anxiety disorder NOS  Axis II: Deferred  Axis III: See medical history Patient Active Problem List  Diagnosis  . VITAMIN D DEFICIENCY  . GLUCOSE INTOLERANCE  . ALLERGIC RHINITIS  . GERD  . CELLULITIS/ABSCESS, FOOT  . SEBORRHEIC DERMATITIS  . Backache  . FREQUENCY, URINARY  . Cerumen impaction  . Polycythemia  . OSA (obstructive sleep apnea)  . Varicose veins of lower extremities with other complications  . Major depressive disorder, recurrent episode, severe, without mention of psychotic behavior  . Generalized anxiety disorder  . Sinusitis nasal  . Hyperglycemia  . Weight gain  . Abdominal distention  . Finger wound, simple, open  . Muscle spasm  of back  . Nonallopathic lesion of thoracic region  . Acute thoracic back pain  . Cervical disc disorder with radiculopathy of cervical region  . Diarrhea  . Sleep difficulties  . Nonallopathic lesion-rib cage  . Nonallopathic lesion of lumbosacral region  . Hyperlipidemia  . Acute pharyngitis    Plan:   Patient is stable on his current medication.  Like to continue his current psychiatric medication.  I will continue Abilify 5 mg daily, Remeron 15 mg at bedtime and Effexor 75 mg daily.  He reported no side effects.  Discussed medication side effects and benefits.  Recommended to call us back if he has any question or any concern.  Follow-up in 3 months.  Naoma Boxell T., MD 12/02/2015               Patient ID: Bruce Ellison, male   DOB: 02/04/1981, 35 y.o.   MRN: [id number]

## 2015-12-11 ENCOUNTER — Ambulatory Visit (INDEPENDENT_AMBULATORY_CARE_PROVIDER_SITE_OTHER): Payer: Medicare Other

## 2015-12-11 DIAGNOSIS — Z23 Encounter for immunization: Secondary | ICD-10-CM | POA: Diagnosis not present

## 2016-02-15 ENCOUNTER — Ambulatory Visit (HOSPITAL_COMMUNITY): Payer: Self-pay | Admitting: Psychiatry

## 2016-03-09 ENCOUNTER — Ambulatory Visit (HOSPITAL_COMMUNITY): Payer: Self-pay | Admitting: Psychiatry

## 2016-03-17 ENCOUNTER — Other Ambulatory Visit (HOSPITAL_COMMUNITY): Payer: Self-pay

## 2016-03-17 DIAGNOSIS — F428 Other obsessive-compulsive disorder: Secondary | ICD-10-CM

## 2016-03-17 DIAGNOSIS — F422 Mixed obsessional thoughts and acts: Secondary | ICD-10-CM

## 2016-03-17 MED ORDER — MIRTAZAPINE 15 MG PO TABS: 15.0000 mg | ORAL_TABLET | Freq: Every day | ORAL | 0 refills | Status: DC

## 2016-03-17 MED ORDER — ARIPIPRAZOLE 5 MG PO TABS: 5.0000 mg | ORAL_TABLET | Freq: Every day | ORAL | 0 refills | Status: DC

## 2016-03-17 MED ORDER — VENLAFAXINE HCL ER 75 MG PO CP24: 75.0000 mg | ORAL_CAPSULE | Freq: Every day | ORAL | 0 refills | Status: DC

## 2016-03-20 ENCOUNTER — Ambulatory Visit (HOSPITAL_COMMUNITY): Payer: Self-pay | Admitting: Psychiatry

## 2016-03-28 ENCOUNTER — Encounter (HOSPITAL_COMMUNITY): Payer: Self-pay | Admitting: Psychiatry

## 2016-03-28 ENCOUNTER — Ambulatory Visit (INDEPENDENT_AMBULATORY_CARE_PROVIDER_SITE_OTHER): Payer: Medicare Other | Admitting: Psychiatry

## 2016-03-28 DIAGNOSIS — Z880 Allergy status to penicillin: Secondary | ICD-10-CM

## 2016-03-28 DIAGNOSIS — Z818 Family history of other mental and behavioral disorders: Secondary | ICD-10-CM

## 2016-03-28 DIAGNOSIS — F428 Other obsessive-compulsive disorder: Secondary | ICD-10-CM

## 2016-03-28 DIAGNOSIS — Z8371 Family history of colonic polyps: Secondary | ICD-10-CM

## 2016-03-28 DIAGNOSIS — Z833 Family history of diabetes mellitus: Secondary | ICD-10-CM | POA: Diagnosis not present

## 2016-03-28 DIAGNOSIS — Z882 Allergy status to sulfonamides status: Secondary | ICD-10-CM

## 2016-03-28 DIAGNOSIS — Z79899 Other long term (current) drug therapy: Secondary | ICD-10-CM

## 2016-03-28 DIAGNOSIS — Z8042 Family history of malignant neoplasm of prostate: Secondary | ICD-10-CM | POA: Diagnosis not present

## 2016-03-28 DIAGNOSIS — F422 Mixed obsessional thoughts and acts: Secondary | ICD-10-CM

## 2016-03-28 DIAGNOSIS — Z888 Allergy status to other drugs, medicaments and biological substances status: Secondary | ICD-10-CM

## 2016-03-28 MED ORDER — VENLAFAXINE HCL ER 75 MG PO CP24: 75.0000 mg | ORAL_CAPSULE | Freq: Every day | ORAL | 2 refills | Status: DC

## 2016-03-28 MED ORDER — MIRTAZAPINE 15 MG PO TABS: 15.0000 mg | ORAL_TABLET | Freq: Every day | ORAL | 2 refills | Status: DC

## 2016-03-28 MED ORDER — ARIPIPRAZOLE 5 MG PO TABS: 5.0000 mg | ORAL_TABLET | Freq: Every day | ORAL | 2 refills | Status: DC

## 2016-03-28 NOTE — Progress Notes (Signed)
[facility] MD/PA/NP OP Progress Note  03/28/2016 2:45 PM Bruce Ellison  MRN:  [id number]  Chief Complaint:  Subjective:  I'm doing good.  HPI: Patient came for his follow-up appointment.  He's taking his medication and reported no side effects.  He endorses depression and OCD symptoms are under control.  He denies any major panic attack.  He sleeping good.  He has mild tremors in his left hand.  He denies any paranoia, hallucination, suicidal thoughts or homicidal thought.  His energy level is good.  He has no issues with his psychiatric medication.  He is taking Remeron, Effexor and Abilify.  Patient denies drinking alcohol or using any illegal substances.  He lives with his mother and his grandfather.  Visit Diagnosis:    ICD-9-CM ICD-10-CM   1. Other obsessive-compulsive disorders 300.3 F42.8 venlafaxine XR (EFFEXOR-XR) 75 MG 24 hr capsule     mirtazapine (REMERON) 15 MG tablet  2. Mixed obsessional thoughts and acts 300.3 F42.2 ARIPiprazole (ABILIFY) 5 MG tablet    Past Psychiatric History: Reviewed.  Past Medical History:  Past Medical History:  Diagnosis Date  . Adenomatous colon polyp    tubular  . Allergic rhinitis   . Anxiety   . Depression   . GERD (gastroesophageal reflux disease)   . Glucose intolerance (impaired glucose tolerance)   . HTN (hypertension)    borderline  . Insomnia   . Obesity   . OCD (obsessive compulsive disorder)   . Personality disorder    with schizoid features  . Sleep apnea   . Tourette's    per [facility]  . Vitamin D deficiency     Past Surgical History:  Procedure Laterality Date  . adnoidectomy  1992    Family Psychiatric History: Reviewed.  Family History:  Family History  Problem Relation Age of Onset  . Cholelithiasis Maternal Grandmother   . Diabetes Maternal Grandmother   . Prostate cancer Maternal Grandfather   . Colon polyps Maternal Grandfather   . Depression Mother   . Bipolar disorder Father   . Esophageal cancer Neg Hx    . Rectal cancer Neg Hx   . Stomach cancer Neg Hx   . Colon cancer Other     Social History:  Social History   Social History  . Marital status: Single    Spouse name: N/A  . Number of children: 0  . Years of education: N/A   Occupational History  . unemployed    Social History Main Topics  . Smoking status: Never Smoker  . Smokeless tobacco: Never Used  . Alcohol use No  . Drug use: No  . Sexual activity: Not Currently   Other Topics Concern  . Not on file   Social History Narrative   Lives with parents.   Regular exercise- No    Allergies:  Allergies  Allergen Reactions  . Honey Bee Treatment [Bee Venom] Anaphylaxis, Hives and Rash    Pt reports he does not go into anaphylaxis with bee stings, just honey  . Doxycycline   . Quetiapine Other (See Comments)    restlessness  . Penicillins Hives and Rash  . Sulfa Antibiotics Hives and Rash    Metabolic Disorder Labs: Lab Results  Component Value Date   HGBA1C 5.6 03/10/2015   No results found for: PROLACTIN Lab Results  Component Value Date   CHOL 193 03/10/2015   TRIG 104.0 03/10/2015   HDL 42.40 03/10/2015   CHOLHDL 5 03/10/2015   VLDL 20.8 03/10/2015  LDLCALC 130 (H) 03/10/2015   LDLCALC 130 (H) 05/02/2006     Current Medications: Current Outpatient Prescriptions  Medication Sig Dispense Refill  . ARIPiprazole (ABILIFY) 5 MG tablet Take 1 tablet (5 mg total) by mouth daily. 30 tablet 2  . desonide (DESOWEN) 0.05 % cream     . mirtazapine (REMERON) 15 MG tablet Take 1 tablet (15 mg total) by mouth at bedtime. 30 tablet 2  . Multiple Vitamin (MULTIVITAMIN WITH MINERALS) TABS Take 1 tablet by mouth every morning.    . venlafaxine XR (EFFEXOR-XR) 75 MG 24 hr capsule Take 1 capsule (75 mg total) by mouth daily with breakfast. 30 capsule 2   No current facility-administered medications for this visit.     Neurologic: Headache: No Seizure: No Paresthesias: No  Musculoskeletal: Strength &  Muscle Tone: within normal limits Gait & Station: normal Patient leans: N/A  Psychiatric Specialty Exam: ROS  Blood pressure 133/84, pulse 99, resp. rate 12, height 5' 6.5" (1.689 m), weight 234 lb 3.2 oz (106.2 kg).Body mass index is 37.23 kg/m.  General Appearance: Fairly Groomed  Eye Contact:  Good  Speech:  Clear and Coherent  Volume:  Normal  Mood:  Anxious  Affect:  Appropriate and Congruent  Thought Process:  Goal Directed  Orientation:  Full (Time, Place, and Person)  Thought Content: WDL and Logical   Suicidal Thoughts:  No  Homicidal Thoughts:  No  Memory:  Immediate;   Good Recent;   Good Remote;   Good  Judgement:  Good  Insight:  Good  Psychomotor Activity:  Tremor  Concentration:  Concentration: Fair and Attention Span: Good  Recall:  Good  Fund of Knowledge: Good  Language: Good  Akathisia:  No  Handed:  Right  AIMS (if indicated):  0  Assets:  Communication Skills Desire for Improvement Financial Resources/Insurance Housing Physical Health Resilience Social Support  ADL's:  Intact  Cognition: WNL  Sleep:  Good      Treatment Plan Summary Obsessive-compulsive disorder, Tourette's syndrome, anxiety disorder NOS.   Plan; Patient is a stable on his current psychiatric medication.  He has mild tremors in his left hand but he does not want to take any other medication.  I will continue Remeron 15 mg at bedtime, Effexor 75 mg daily and Abilify 5 mg daily.  Discussed medication side effects and benefits.  Recommended to call us back if he has any question, concern if he feel worsening of the symptom.  Follow-up in 3 months.   Maxmillian Carsey T., MD 03/28/2016, 2:45 PM

## 2016-03-30 ENCOUNTER — Telehealth: Payer: Self-pay | Admitting: Internal Medicine

## 2016-03-30 ENCOUNTER — Telehealth (HOSPITAL_COMMUNITY): Payer: Self-pay

## 2016-03-30 DIAGNOSIS — F332 Major depressive disorder, recurrent severe without psychotic features: Secondary | ICD-10-CM

## 2016-03-30 DIAGNOSIS — D751 Secondary polycythemia: Secondary | ICD-10-CM

## 2016-03-30 DIAGNOSIS — R739 Hyperglycemia, unspecified: Secondary | ICD-10-CM

## 2016-03-30 NOTE — Telephone Encounter (Signed)
Called patient and let him know and gave him the names of the labs to have done,.

## 2016-03-30 NOTE — Telephone Encounter (Signed)
Pt called stating his psychiatrist request blood work for : A1c,CBC and CMP. Pt was wondering if Dr. Camila Li can order this or he need to come in for an appt? Please advise.

## 2016-03-30 NOTE — Telephone Encounter (Signed)
No urgency but when he see his primary care physician and then he need blood work including hemoglobin A1c, CBC and comprehensive metabolic panel.

## 2016-03-30 NOTE — Telephone Encounter (Signed)
Patient called, he thought you said something about labs when he was here. I did not see an order in the chart, did you want to order labs on this patient?

## 2016-03-31 NOTE — Addendum Note (Signed)
Addended by: Cresenciano Lick on: 03/31/2016 11:13 AM   Modules accepted: Orders

## 2016-03-31 NOTE — Telephone Encounter (Signed)
Lab orders placed. Pt informed.  

## 2016-03-31 NOTE — Telephone Encounter (Signed)
OK labs Thx

## 2016-04-13 ENCOUNTER — Other Ambulatory Visit (INDEPENDENT_AMBULATORY_CARE_PROVIDER_SITE_OTHER): Payer: Medicare Other

## 2016-04-13 ENCOUNTER — Other Ambulatory Visit: Payer: Medicare Other

## 2016-04-13 DIAGNOSIS — R739 Hyperglycemia, unspecified: Secondary | ICD-10-CM | POA: Diagnosis not present

## 2016-04-13 DIAGNOSIS — D751 Secondary polycythemia: Secondary | ICD-10-CM

## 2016-04-13 DIAGNOSIS — F332 Major depressive disorder, recurrent severe without psychotic features: Secondary | ICD-10-CM | POA: Diagnosis not present

## 2016-04-13 LAB — CBC WITH DIFFERENTIAL/PLATELET
BASOS PCT: 0.6 % (ref 0.0–3.0)
Basophils Absolute: 0 10*3/uL (ref 0.0–0.1)
Eosinophils Absolute: 0.3 10*3/uL (ref 0.0–0.7)
Eosinophils Relative: 6.3 % — ABNORMAL HIGH (ref 0.0–5.0)
HEMATOCRIT: 50.1 % (ref 39.0–52.0)
Hemoglobin: 17.1 g/dL — ABNORMAL HIGH (ref 13.0–17.0)
LYMPHS ABS: 1.8 10*3/uL (ref 0.7–4.0)
LYMPHS PCT: 34 % (ref 12.0–46.0)
MCHC: 34.2 g/dL (ref 30.0–36.0)
MCV: 88.7 fl (ref 78.0–100.0)
Monocytes Absolute: 0.6 10*3/uL (ref 0.1–1.0)
Monocytes Relative: 11.8 % (ref 3.0–12.0)
NEUTROS ABS: 2.6 10*3/uL (ref 1.4–7.7)
Neutrophils Relative %: 47.3 % (ref 43.0–77.0)
PLATELETS: 218 10*3/uL (ref 150.0–400.0)
RBC: 5.65 Mil/uL (ref 4.22–5.81)
RDW: 13.5 % (ref 11.5–15.5)
WBC: 5.4 10*3/uL (ref 4.0–10.5)

## 2016-04-13 LAB — HEMOGLOBIN A1C: Hgb A1c MFr Bld: 5.6 % (ref 4.6–6.5)

## 2016-04-13 LAB — COMPREHENSIVE METABOLIC PANEL
ALBUMIN: 4.4 g/dL (ref 3.5–5.2)
ALT: 44 U/L (ref 0–53)
AST: 27 U/L (ref 0–37)
Alkaline Phosphatase: 63 U/L (ref 39–117)
BUN: 10 mg/dL (ref 6–23)
CALCIUM: 9.2 mg/dL (ref 8.4–10.5)
CHLORIDE: 107 meq/L (ref 96–112)
CO2: 27 mEq/L (ref 19–32)
CREATININE: 0.93 mg/dL (ref 0.40–1.50)
GFR: 97.9 mL/min (ref >60.00)
Glucose, Bld: 93 mg/dL (ref 70–99)
Potassium: 4 mEq/L (ref 3.5–5.1)
Sodium: 141 mEq/L (ref 135–145)
Total Bilirubin: 1 mg/dL (ref 0.2–1.2)
Total Protein: 7.2 g/dL (ref 6.0–8.3)

## 2016-05-05 DIAGNOSIS — N281 Cyst of kidney, acquired: Secondary | ICD-10-CM | POA: Diagnosis not present

## 2016-05-05 DIAGNOSIS — N401 Enlarged prostate with lower urinary tract symptoms: Secondary | ICD-10-CM | POA: Diagnosis not present

## 2016-05-05 DIAGNOSIS — R351 Nocturia: Secondary | ICD-10-CM | POA: Diagnosis not present

## 2016-05-17 DIAGNOSIS — N281 Cyst of kidney, acquired: Secondary | ICD-10-CM | POA: Diagnosis not present

## 2016-06-09 ENCOUNTER — Other Ambulatory Visit: Payer: Self-pay

## 2016-06-09 DIAGNOSIS — I8390 Asymptomatic varicose veins of unspecified lower extremity: Secondary | ICD-10-CM

## 2016-06-19 ENCOUNTER — Encounter: Payer: Self-pay | Admitting: Internal Medicine

## 2016-06-19 ENCOUNTER — Ambulatory Visit (INDEPENDENT_AMBULATORY_CARE_PROVIDER_SITE_OTHER): Payer: Medicare Other | Admitting: Internal Medicine

## 2016-06-19 DIAGNOSIS — J301 Allergic rhinitis due to pollen: Secondary | ICD-10-CM | POA: Diagnosis not present

## 2016-06-19 DIAGNOSIS — H6123 Impacted cerumen, bilateral: Secondary | ICD-10-CM | POA: Diagnosis not present

## 2016-06-19 DIAGNOSIS — R635 Abnormal weight gain: Secondary | ICD-10-CM | POA: Diagnosis not present

## 2016-06-19 NOTE — Progress Notes (Signed)
Pre visit review using our clinic review tool, if applicable. No additional management support is needed unless otherwise documented below in the visit note. 

## 2016-06-19 NOTE — Assessment & Plan Note (Signed)
Loratidine prn 

## 2016-06-19 NOTE — Assessment & Plan Note (Signed)
Wt Readings from Last 3 Encounters:  06/19/16 233 lb (105.7 kg)  08/18/15 228 lb (103.4 kg)  03/10/15 232 lb (105.2 kg)

## 2016-06-19 NOTE — Assessment & Plan Note (Signed)
See procedure 

## 2016-06-19 NOTE — Progress Notes (Signed)
Subjective:  Patient ID: Bruce Ellison, male    DOB: 30-Nov-1980  Age: 36 y.o. MRN: [id number]  CC: No chief complaint on file.   HPI Bruce Ellison presents for hearing loss B  Outpatient Medications Prior to Visit  Medication Sig Dispense Refill  . ARIPiprazole (ABILIFY) 5 MG tablet Take 1 tablet (5 mg total) by mouth daily. 30 tablet 2  . desonide (DESOWEN) 0.05 % cream     . mirtazapine (REMERON) 15 MG tablet Take 1 tablet (15 mg total) by mouth at bedtime. 30 tablet 2  . Multiple Vitamin (MULTIVITAMIN WITH MINERALS) TABS Take 1 tablet by mouth every morning.    . venlafaxine XR (EFFEXOR-XR) 75 MG 24 hr capsule Take 1 capsule (75 mg total) by mouth daily with breakfast. 30 capsule 2   No facility-administered medications prior to visit.     ROS Review of Systems  Constitutional: Positive for unexpected weight change. Negative for appetite change and fatigue.  HENT: Positive for hearing loss. Negative for congestion, ear discharge, nosebleeds, sneezing, sore throat, tinnitus and trouble swallowing.   Eyes: Negative for itching and visual disturbance.  Respiratory: Negative for cough.   Cardiovascular: Negative for chest pain, palpitations and leg swelling.  Gastrointestinal: Negative for abdominal distention, blood in stool, diarrhea and nausea.  Genitourinary: Negative for frequency and hematuria.  Musculoskeletal: Negative for back pain, gait problem, joint swelling and neck pain.  Skin: Negative for rash.  Neurological: Negative for dizziness, tremors, speech difficulty and weakness.  Psychiatric/Behavioral: Negative for agitation, dysphoric mood and sleep disturbance. The patient is not nervous/anxious.     Objective:  BP 118/66 (BP Location: Left Arm, Patient Position: Sitting, Cuff Size: Large)   Pulse (!) 105   Temp 99.2 F (37.3 C) (Oral)   Ht 5' 6.5" (1.689 m)   Wt 233 lb (105.7 kg)   SpO2 99%   BMI 37.04 kg/m   BP Readings from Last 3 Encounters:  06/19/16  118/66  08/18/15 122/78  03/10/15 128/82    Wt Readings from Last 3 Encounters:  06/19/16 233 lb (105.7 kg)  08/18/15 228 lb (103.4 kg)  03/10/15 232 lb (105.2 kg)    Physical Exam  Constitutional: He is oriented to person, place, and time. He appears well-developed. No distress.  NAD  HENT:  Mouth/Throat: Oropharynx is clear and moist.  Eyes: Conjunctivae are normal. Pupils are equal, round, and reactive to light.  Neck: Normal range of motion. No JVD present. No thyromegaly present.  Cardiovascular: Normal rate, regular rhythm, normal heart sounds and intact distal pulses.  Exam reveals no gallop and no friction rub.   No murmur heard. Pulmonary/Chest: Effort normal and breath sounds normal. No respiratory distress. He has no wheezes. He has no rales. He exhibits no tenderness.  Abdominal: Soft. Bowel sounds are normal. He exhibits no distension and no mass. There is no tenderness. There is no rebound and no guarding.  Musculoskeletal: Normal range of motion. He exhibits no edema or tenderness.  Lymphadenopathy:    He has no cervical adenopathy.  Neurological: He is alert and oriented to person, place, and time. He has normal reflexes. No cranial nerve deficit. He exhibits normal muscle tone. He displays a negative Romberg sign. Coordination and gait normal.  Skin: Skin is warm and dry. No rash noted.  Psychiatric: He has a normal mood and affect. His behavior is normal. Judgment and thought content normal.  Obese  B wax   Procedure Note :  Procedure :  Ear irrigation   Indication:  Cerumen impaction B   Risks, including pain, dizziness, eardrum perforation, bleeding, infection and others as well as benefits were explained to the patient in detail. Verbal consent was obtained and the patient agreed to proceed.    We used "The Elephant Ear Irrigation Device" filled with lukewarm water for irrigation. A large amount wax was recovered. Procedure has also required manual wax  removal with an ear loop.   Tolerated well. Complications: None.   Postprocedure instructions :  Call if problems.    Lab Results  Component Value Date   WBC 5.4 04/13/2016   HGB 17.1 (H) 04/13/2016   HCT 50.1 04/13/2016   PLT 218.0 04/13/2016   GLUCOSE 93 04/13/2016   CHOL 193 03/10/2015   TRIG 104.0 03/10/2015   HDL 42.40 03/10/2015   LDLDIRECT 136.9 08/30/2010   LDLCALC 130 (H) 03/10/2015   ALT 44 04/13/2016   AST 27 04/13/2016   NA 141 04/13/2016   K 4.0 04/13/2016   CL 107 04/13/2016   CREATININE 0.93 04/13/2016   BUN 10 04/13/2016   CO2 27 04/13/2016   TSH 1.09 03/10/2015   PSA 0.65 03/10/2015   HGBA1C 5.6 04/13/2016    US Abdomen Complete  Result Date: 12/16/2013 CLINICAL DATA:  36 year old male with recurrent right upper quadrant abdominal pain. Subsequent encounter. EXAM: ULTRASOUND ABDOMEN COMPLETE COMPARISON:  Thoracic spine MRI 06/04/2013. FINDINGS: Gallbladder: No gallstones or wall thickening visualized. No sonographic Murphy sign noted. Common bile duct: Diameter: 5 mm, normal Liver: No focal lesion identified. Within normal limits in parenchymal echogenicity. No intrahepatic biliary ductal dilatation. IVC: No abnormality visualized. Pancreas: Visualized portion unremarkable. Spleen: Size and appearance within normal limits. Right Kidney: Length: 11.7 cm. Echogenicity within normal limits. No mass or hydronephrosis visualized. Left Kidney: Length: 12.1 cm. Small midpole region cortical cyst measuring up to 10 mm appears simple and unchanged. Echogenicity within normal limits. No mass or hydronephrosis visualized. Abdominal aorta: No aneurysm visualized. Other findings: None. IMPRESSION: Normal gallbladder.  Normal abdominal ultrasound. Electronically Signed   By: Lars Pinks M.D.   On: 12/16/2013 09:13    Assessment & Plan:   There are no diagnoses linked to this encounter. I am having Mr. Brothers maintain his multivitamin with minerals, desonide, venlafaxine XR,  mirtazapine, and ARIPiprazole.  No orders of the defined types were placed in this encounter.    Follow-up: No Follow-up on file.  Walker Kehr, MD

## 2016-06-20 ENCOUNTER — Encounter (HOSPITAL_COMMUNITY): Payer: Self-pay | Admitting: Psychiatry

## 2016-06-20 ENCOUNTER — Ambulatory Visit (INDEPENDENT_AMBULATORY_CARE_PROVIDER_SITE_OTHER): Payer: Medicare Other | Admitting: Psychiatry

## 2016-06-20 DIAGNOSIS — Z79899 Other long term (current) drug therapy: Secondary | ICD-10-CM

## 2016-06-20 DIAGNOSIS — F429 Obsessive-compulsive disorder, unspecified: Secondary | ICD-10-CM | POA: Diagnosis not present

## 2016-06-20 DIAGNOSIS — F428 Other obsessive-compulsive disorder: Secondary | ICD-10-CM

## 2016-06-20 DIAGNOSIS — F952 Tourette's disorder: Secondary | ICD-10-CM

## 2016-06-20 DIAGNOSIS — Z818 Family history of other mental and behavioral disorders: Secondary | ICD-10-CM | POA: Diagnosis not present

## 2016-06-20 DIAGNOSIS — F419 Anxiety disorder, unspecified: Secondary | ICD-10-CM

## 2016-06-20 DIAGNOSIS — F422 Mixed obsessional thoughts and acts: Secondary | ICD-10-CM

## 2016-06-20 MED ORDER — VENLAFAXINE HCL ER 75 MG PO CP24: 75.0000 mg | ORAL_CAPSULE | Freq: Every day | ORAL | 2 refills | Status: DC

## 2016-06-20 MED ORDER — MIRTAZAPINE 15 MG PO TABS: 15.0000 mg | ORAL_TABLET | Freq: Every day | ORAL | 2 refills | Status: DC

## 2016-06-20 MED ORDER — ARIPIPRAZOLE 5 MG PO TABS: 5.0000 mg | ORAL_TABLET | Freq: Every day | ORAL | 2 refills | Status: DC

## 2016-06-20 NOTE — Progress Notes (Signed)
[facility] MD/PA/NP OP Progress Note  06/20/2016 2:38 PM QUINTERIOUS WALRAVEN  MRN:  [id number]  Chief Complaint:  Subjective:  I'm doing good.  I'm taking medication.  HPI: Bruce Ellison came for his follow-up appointment.  He is taking his medication and denies any side effects.  He recently seen his primary care physician and he has blood work.  His improvement see and complaining has a metabolic panel was normal.  He reported his depression and OCD symptoms are much controlled with the medication.  He has no tremors, shakes or any EPS.  He denies any irritability, anger, mania or any psychosis.  His energy level is good.  Patient denies drinking alcohol or using any illegal substances.  He continues to work 2 hours a day and he does not feel distressed or overwhelmed.  He is taking Remeron, Effexor and Abilify.  He denies drinking alcohol or using any illegal substances.  He lives with his mother and his grandfather.  Visit Diagnosis:    ICD-9-CM ICD-10-CM   1. Other obsessive-compulsive disorders 300.3 F42.8 venlafaxine XR (EFFEXOR-XR) 75 MG 24 hr capsule     mirtazapine (REMERON) 15 MG tablet  2. Mixed obsessional thoughts and acts 300.3 F42.2 ARIPiprazole (ABILIFY) 5 MG tablet    Past Psychiatric History: Reviewed. Patient has history of psychiatric illness at age 36.  At that time he was in [place].  He has Tic, anxiety and anger. He has history of paranoia.  In the past he had tried Geodon, Risperdal and Seroquel.  He was seeing psychiatrist at [place].  In the past he was also to get treatment in [place].  He was seeing psychiatrist in [place] in [place].  He&amp;#39;s been taking the medication since age 36.  Recently his been seeing Dr. Toy Care who had prescribed her Zoloft, Valium, Klonopin, Xanax, Prozac and Ambien.  We also tried luvox however he developed more irritability.  Past Medical History:  Past Medical History:  Diagnosis Date  . Adenomatous colon polyp    tubular  . Allergic  rhinitis   . Anxiety   . Depression   . GERD (gastroesophageal reflux disease)   . Glucose intolerance (impaired glucose tolerance)   . HTN (hypertension)    borderline  . Insomnia   . Obesity   . OCD (obsessive compulsive disorder)   . Personality disorder    with schizoid features  . Sleep apnea   . Tourette's    per [facility]  . Vitamin D deficiency     Past Surgical History:  Procedure Laterality Date  . adnoidectomy  1992    Family Psychiatric History: Reviewed.  Family History:  Family History  Problem Relation Age of Onset  . Cholelithiasis Maternal Grandmother   . Diabetes Maternal Grandmother   . Prostate cancer Maternal Grandfather   . Colon polyps Maternal Grandfather   . Depression Mother   . Bipolar disorder Father   . Esophageal cancer Neg Hx   . Rectal cancer Neg Hx   . Stomach cancer Neg Hx   . Colon cancer Other     Social History:  Social History   Social History  . Marital status: Single    Spouse name: N/A  . Number of children: 0  . Years of education: N/A   Occupational History  . unemployed    Social History Main Topics  . Smoking status: Never Smoker  . Smokeless tobacco: Never Used  . Alcohol use No  . Drug use: No  .  Sexual activity: Not Currently   Other Topics Concern  . Not on file   Social History Narrative   Lives with parents.   Regular exercise- No    Allergies:  Allergies  Allergen Reactions  . Honey Bee Treatment [Bee Venom] Anaphylaxis, Hives and Rash    Pt reports he does not go into anaphylaxis with bee stings, just honey  . Doxycycline   . Quetiapine Other (See Comments)    restlessness  . Penicillins Hives and Rash  . Sulfa Antibiotics Hives and Rash    Metabolic Disorder Labs: Recent Results (from the past 2160 hour(s))  CBC with Differential/Platelet     Status: Abnormal   Collection Time: 04/13/16  8:05 AM  Result Value Ref Range   WBC 5.4 4.0 - 10.5 K/uL   RBC 5.65 4.22 - 5.81 Mil/uL    Hemoglobin 17.1 (H) 13.0 - 17.0 g/dL   HCT 50.1 39.0 - 52.0 %   MCV 88.7 78.0 - 100.0 fl   MCHC 34.2 30.0 - 36.0 g/dL   RDW 13.5 11.5 - 15.5 %   Platelets 218.0 150.0 - 400.0 K/uL   Neutrophils Relative % 47.3 43.0 - 77.0 %   Lymphocytes Relative 34.0 12.0 - 46.0 %   Monocytes Relative 11.8 3.0 - 12.0 %   Eosinophils Relative 6.3 (H) 0.0 - 5.0 %   Basophils Relative 0.6 0.0 - 3.0 %   Neutro Abs 2.6 1.4 - 7.7 K/uL   Lymphs Abs 1.8 0.7 - 4.0 K/uL   Monocytes Absolute 0.6 0.1 - 1.0 K/uL   Eosinophils Absolute 0.3 0.0 - 0.7 K/uL   Basophils Absolute 0.0 0.0 - 0.1 K/uL  Hemoglobin A1c     Status: None   Collection Time: 04/13/16  8:05 AM  Result Value Ref Range   Hgb A1c MFr Bld 5.6 4.6 - 6.5 %    Comment: Glycemic Control Guidelines for People with Diabetes:Non Diabetic:  <6%Goal of Therapy: <7%Additional Action Suggested:  >8%   Comprehensive metabolic panel     Status: None   Collection Time: 04/13/16  8:05 AM  Result Value Ref Range   Sodium 141 135 - 145 mEq/L   Potassium 4.0 3.5 - 5.1 mEq/L   Chloride 107 96 - 112 mEq/L   CO2 27 19 - 32 mEq/L   Glucose, Bld 93 70 - 99 mg/dL   BUN 10 6 - 23 mg/dL   Creatinine, Ser 0.93 0.40 - 1.50 mg/dL   Total Bilirubin 1.0 0.2 - 1.2 mg/dL   Alkaline Phosphatase 63 39 - 117 U/L   AST 27 0 - 37 U/L   ALT 44 0 - 53 U/L   Total Protein 7.2 6.0 - 8.3 g/dL   Albumin 4.4 3.5 - 5.2 g/dL   Calcium 9.2 8.4 - 10.5 mg/dL   GFR 97.90 >60.00 mL/min   Lab Results  Component Value Date   HGBA1C 5.6 04/13/2016   No results found for: PROLACTIN Lab Results  Component Value Date   CHOL 193 03/10/2015   TRIG 104.0 03/10/2015   HDL 42.40 03/10/2015   CHOLHDL 5 03/10/2015   VLDL 20.8 03/10/2015   LDLCALC 130 (H) 03/10/2015   LDLCALC 130 (H) 05/02/2006     Current Medications: Current Outpatient Prescriptions  Medication Sig Dispense Refill  . ARIPiprazole (ABILIFY) 5 MG tablet Take 1 tablet (5 mg total) by mouth daily. 30 tablet 2  . desonide  (DESOWEN) 0.05 % cream     . mirtazapine (REMERON) 15 MG  tablet Take 1 tablet (15 mg total) by mouth at bedtime. 30 tablet 2  . Multiple Vitamin (MULTIVITAMIN WITH MINERALS) TABS Take 1 tablet by mouth every morning.    . venlafaxine XR (EFFEXOR-XR) 75 MG 24 hr capsule Take 1 capsule (75 mg total) by mouth daily with breakfast. 30 capsule 2   No current facility-administered medications for this visit.     Neurologic: Headache: No Seizure: No Paresthesias: No  Musculoskeletal: Strength & Muscle Tone: within normal limits Gait & Station: normal Patient leans: N/A  Psychiatric Specialty Exam: ROS  Blood pressure 114/76, pulse 83, height 5\' 6"  (1.676 m), weight 231 lb (104.8 kg).There is no height or weight on file to calculate BMI.  General Appearance: Casual  Eye Contact:  Good  Speech:  Clear and Coherent  Volume:  Normal  Mood:  Anxious  Affect:  Appropriate  Thought Process:  Goal Directed  Orientation:  Full (Time, Place, and Person)  Thought Content: WDL and Logical   Suicidal Thoughts:  No  Homicidal Thoughts:  No  Memory:  Immediate;   Good Recent;   Good Remote;   Good  Judgement:  Good  Insight:  Good  Psychomotor Activity:  Normal  Concentration:  Concentration: Good and Attention Span: Good  Recall:  Good  Fund of Knowledge: Good  Language: Good  Akathisia:  No  Handed:  Right  AIMS (if indicated):  0  Assets:  Communication Skills Desire for Improvement Housing Resilience Social Support  ADL's:  Intact  Cognition: WNL  Sleep:  Good     Assessment: Obsessive-compulsive disorder.  Tourette syndrome.  Anxiety disorder NOS  Plan: Patient is doing better on his current psychiatric medication.  I reviewed blood work results and his hemoglobin and C and comprehensive metabolic panel is normal.  He has no side effects.  I will continue Remeron 15 mg at bedtime, Effexor 75 mg daily and Abilify 5 mg daily.  Recommended to call us back if he has any question  or any concern.  Follow-up in 3 months.  Aixa Corsello T., MD 06/20/2016, 2:38 PM

## 2016-06-28 ENCOUNTER — Encounter: Payer: Self-pay | Admitting: Surgery

## 2016-06-30 ENCOUNTER — Encounter (HOSPITAL_COMMUNITY): Payer: Self-pay

## 2016-07-05 ENCOUNTER — Encounter: Payer: Medicare Other | Admitting: Surgery

## 2016-08-29 ENCOUNTER — Telehealth (HOSPITAL_COMMUNITY): Payer: Self-pay

## 2016-08-29 NOTE — Telephone Encounter (Signed)
We can offer vistaril 50 mg every 8 hours as needed for panic and anxiety

## 2016-08-29 NOTE — Telephone Encounter (Signed)
Patient called and said that he is experiencing new symptoms - this is an [name] patient. He states that since yesterday he has been having panic attacks. Symptoms include Anxiety, sweaty palms, nausea and diarrhea. Patient thinks that he needs Lorazepam. Please review and advise, thank you

## 2016-08-30 MED ORDER — HYDROXYZINE HCL 50 MG PO TABS: ORAL_TABLET | ORAL | 1 refills | Status: DC

## 2016-08-30 NOTE — Telephone Encounter (Signed)
I called patient and he is agreeable to trying the Vistaril. I sent an order to the pharmacy for 1 month with a refill.

## 2016-09-10 IMAGING — US US ABDOMEN COMPLETE
1 series · 14 of 25 positions shown · non-contrast
Comparison: Thoracic spine MRI 06/04/2013.

CLINICAL DATA: 33-year-old male with recurrent right upper quadrant
abdominal pain. Subsequent encounter.

EXAM:
ULTRASOUND ABDOMEN COMPLETE

[Series 1: us abdomen complete · 0.45mm/px · 14 of 97 slices shown]
[im 1/97]
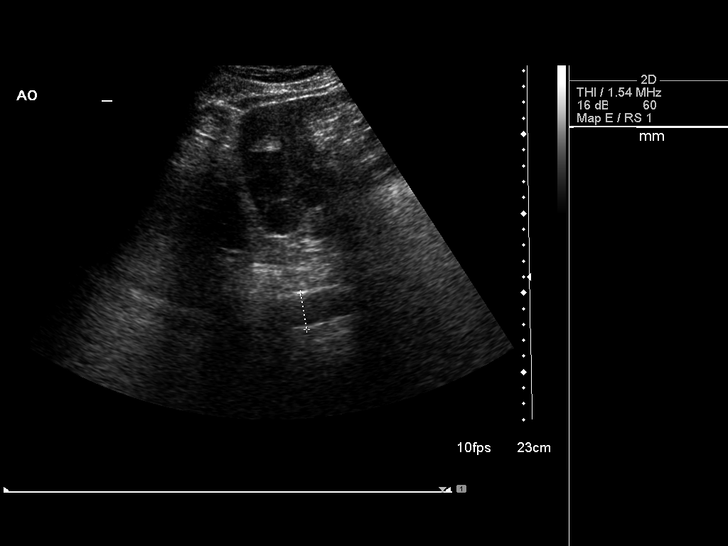
[im 9/97]
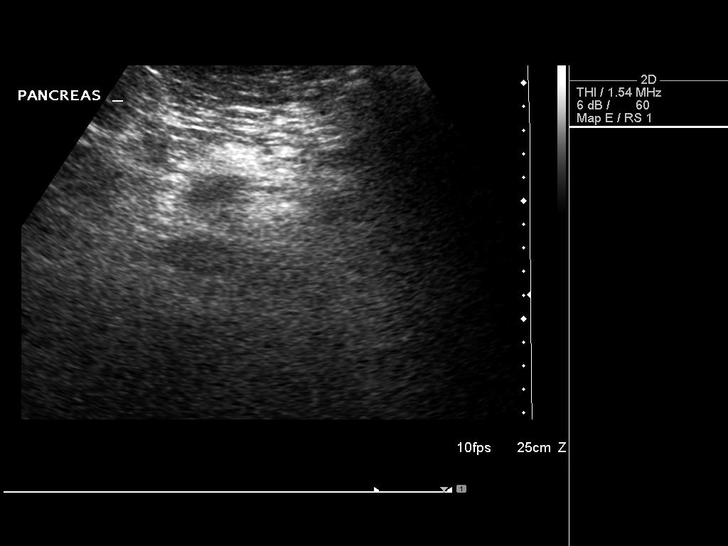
[im 17/97]
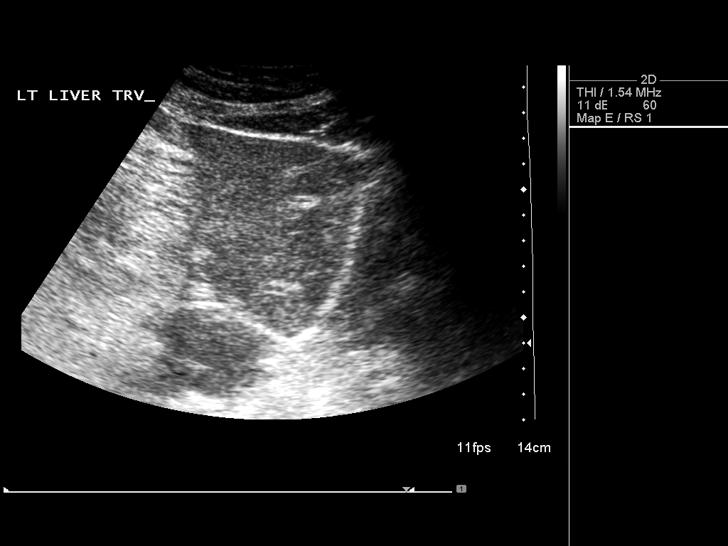
[im 25/97]
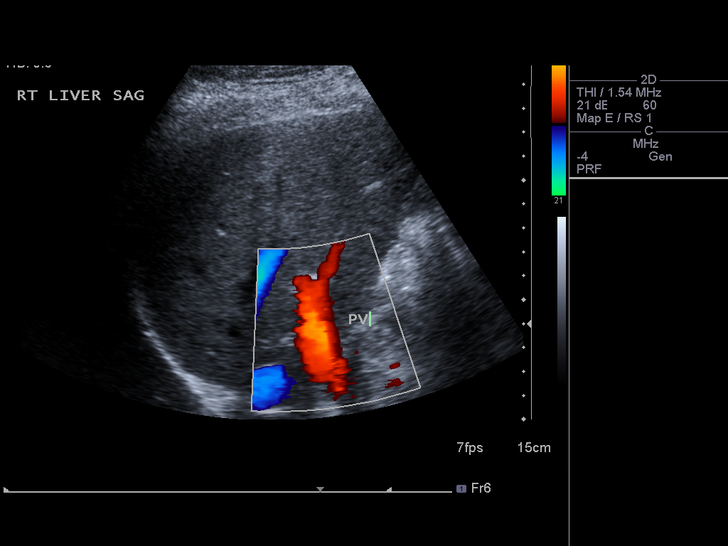
[im 33/97]
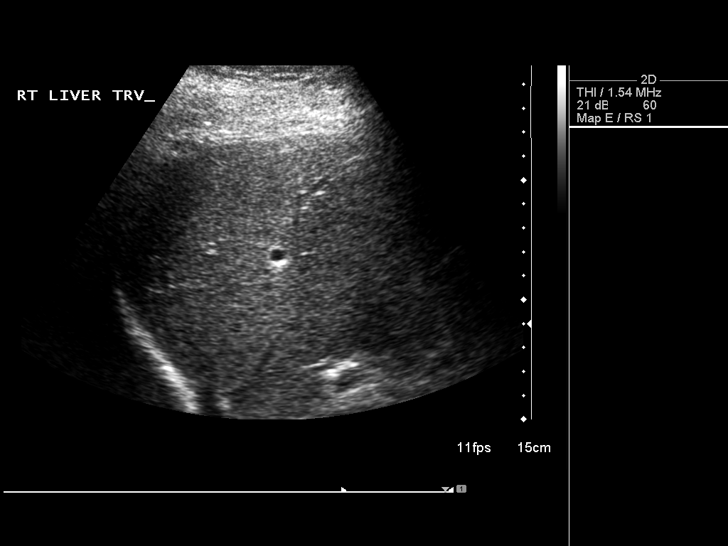
[im 37/97]
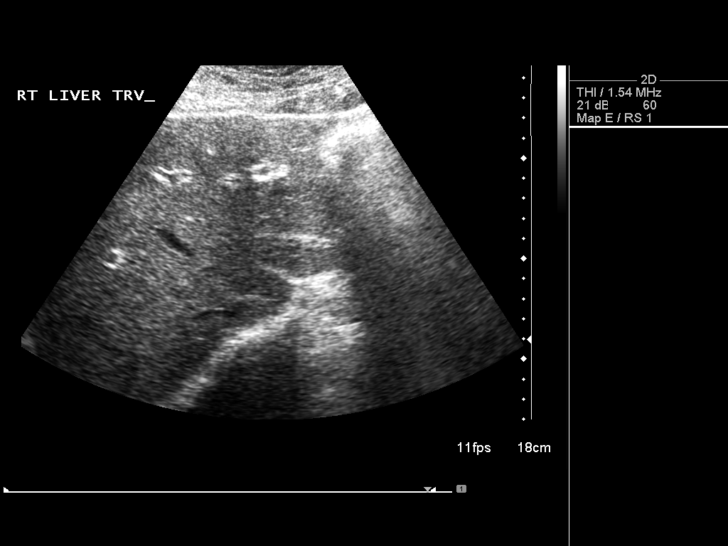
[im 45/97]
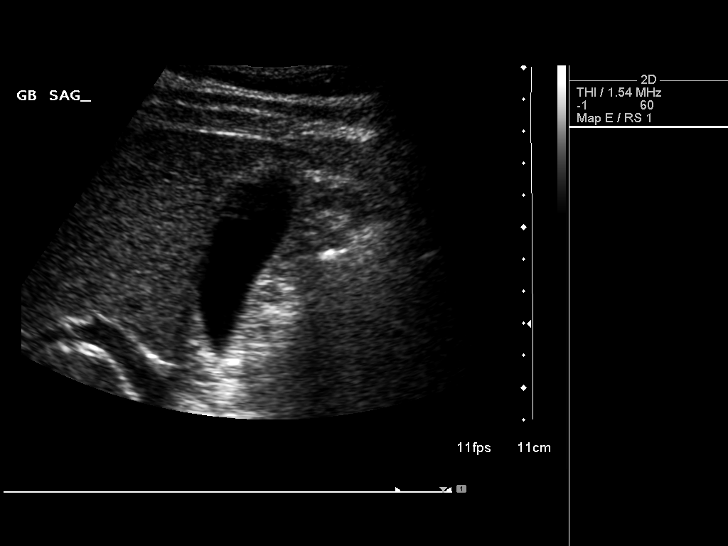
[im 53/97]
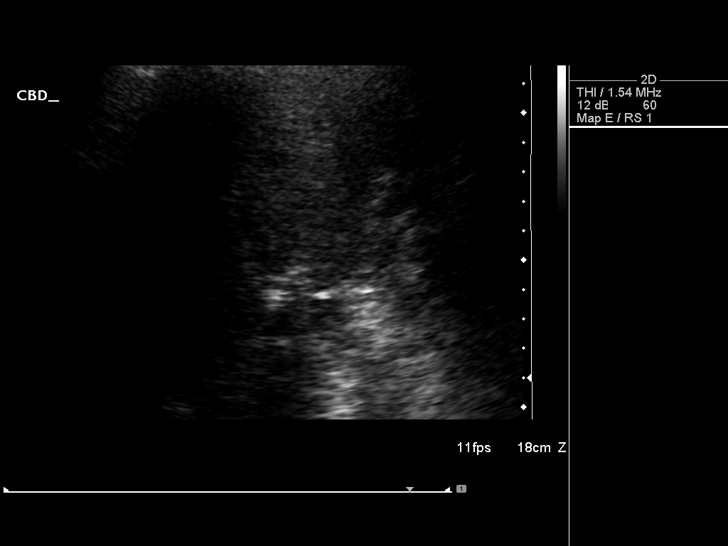
[im 61/97]
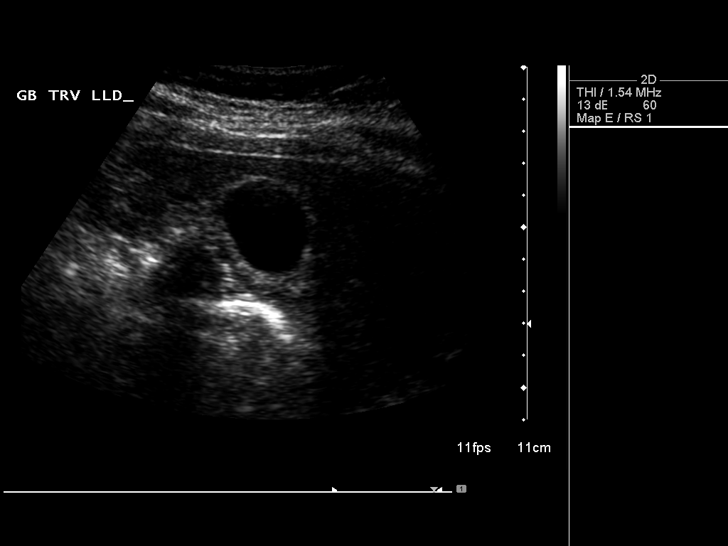
[im 65/97]
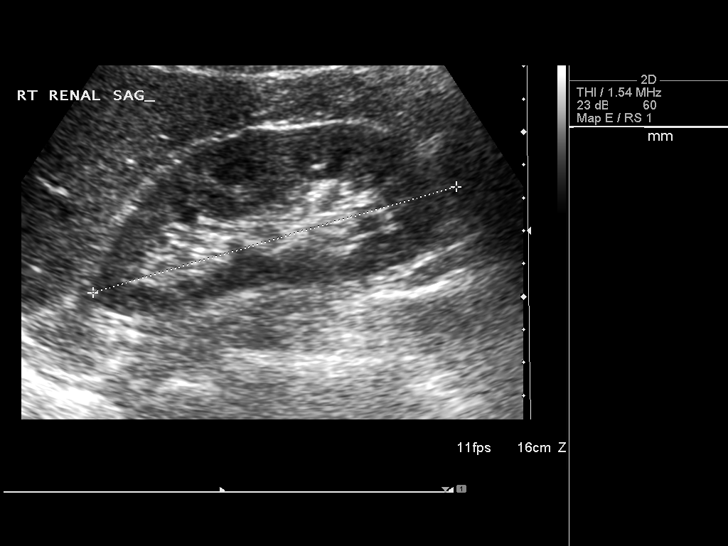
[im 73/97]
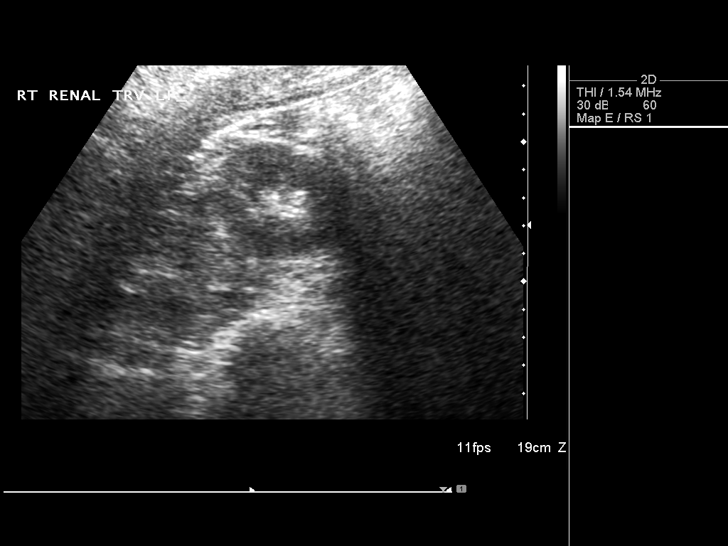
[im 81/97]
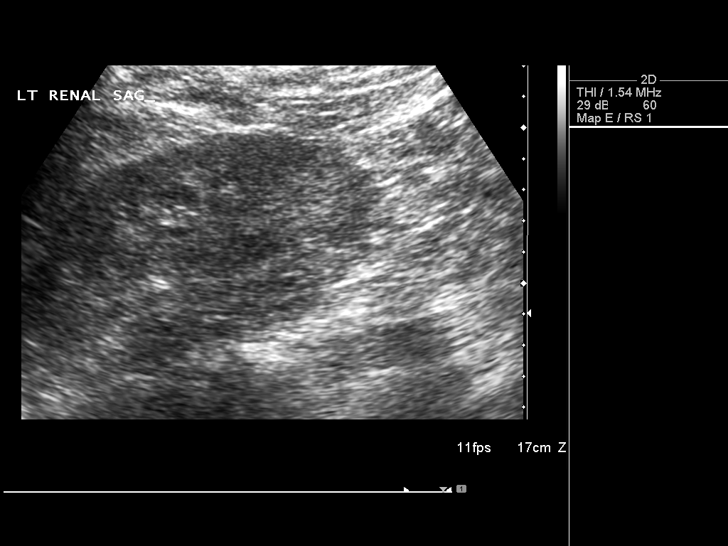
[im 89/97]
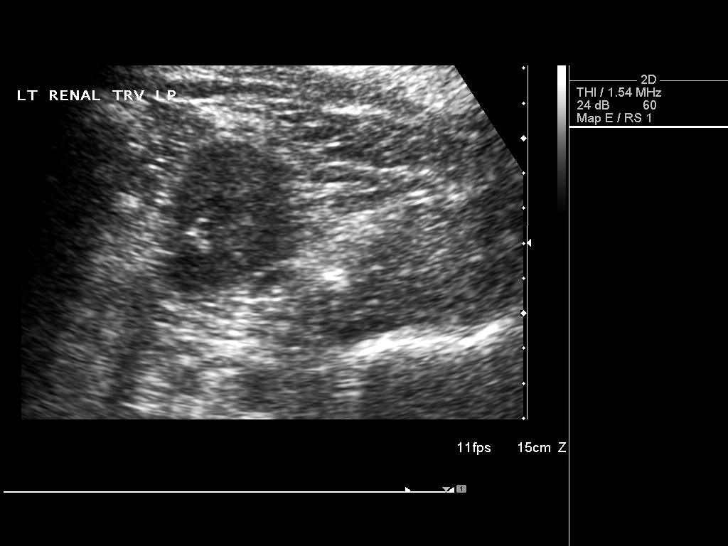
[im 97/97]
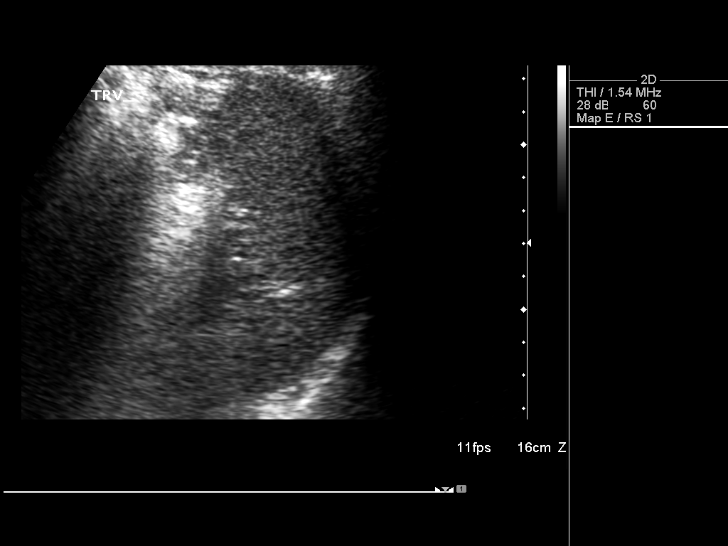

[14 of 25 positions shown; findings below may reference images not displayed]

FINDINGS: Gallbladder: No gallstones or wall thickening visualized. No
sonographic Murphy sign noted.

Common bile duct: Diameter: 5 mm, normal

Liver: No focal lesion identified. Within normal limits in
parenchymal echogenicity. No intrahepatic biliary ductal dilatation.

IVC: No abnormality visualized.

Pancreas: Visualized portion unremarkable.

Spleen: Size and appearance within normal limits.

Right Kidney: Length: 11.7 cm. Echogenicity within normal limits. No
mass or hydronephrosis visualized.

Left Kidney: Length: 12.1 cm. Small midpole region cortical cyst
measuring up to 10 mm appears simple and unchanged. Echogenicity
within normal limits. No mass or hydronephrosis visualized.

Abdominal aorta: No aneurysm visualized.

Other findings: None.
IMPRESSION: Normal gallbladder.  Normal abdominal ultrasound.

## 2016-09-12 ENCOUNTER — Ambulatory Visit (INDEPENDENT_AMBULATORY_CARE_PROVIDER_SITE_OTHER): Payer: Medicare Other | Admitting: Vascular Surgery

## 2016-09-12 ENCOUNTER — Encounter: Payer: Self-pay | Admitting: Vascular Surgery

## 2016-09-12 VITALS — BP 133/85 | HR 110 | Temp 99.1°F | Resp 18 | Ht 66.0 in | Wt 224.0 lb

## 2016-09-12 DIAGNOSIS — I8393 Asymptomatic varicose veins of bilateral lower extremities: Secondary | ICD-10-CM | POA: Diagnosis not present

## 2016-09-12 NOTE — Progress Notes (Signed)
Subjective:     Patient ID: Bruce Ellison, male   DOB: 1980/04/26, 36 y.o.   MRN: [id number]  HPI This 36 year old male was previously evaluated in 2014 by Dr. Annamarie Major. He returns today for evaluation of possible painful varicose veins. On previous evaluation he was found to have no DVT and have some reflux in the great saphenous veins bilaterally but very small caliber veins. Since that time he has gotten along well but does have some discomfort periodically behind both knees and in the ankle areas. He denies any significant distal edema off below his legs do feel heavy at times. Has no history of DVT thrombophlebitis stasis ulcers or bleeding. He does not elastic compression stockings nor elevate his legs.  Past Medical History:  Diagnosis Date  . Adenomatous colon polyp    tubular  . Allergic rhinitis   . Anxiety   . Depression   . GERD (gastroesophageal reflux disease)   . Glucose intolerance (impaired glucose tolerance)   . HTN (hypertension)    borderline  . Insomnia   . Obesity   . OCD (obsessive compulsive disorder)   . Personality disorder    with schizoid features  . Sleep apnea   . Tourette's    per [facility]  . Varicose veins of both lower extremities   . Vitamin D deficiency     Social History  Substance Use Topics  . Smoking status: Never Smoker  . Smokeless tobacco: Never Used  . Alcohol use No    Family History  Problem Relation Age of Onset  . Depression Mother   . Bipolar disorder Father   . Colon cancer Other   . Cholelithiasis Maternal Grandmother   . Diabetes Maternal Grandmother   . Prostate cancer Maternal Grandfather   . Colon polyps Maternal Grandfather   . Esophageal cancer Neg Hx   . Rectal cancer Neg Hx   . Stomach cancer Neg Hx     Allergies  Allergen Reactions  . Honey Bee Treatment [Bee Venom] Anaphylaxis, Hives and Rash    Pt reports he does not go into anaphylaxis with bee stings, just honey  . Doxycycline   . Morphine Other  (See Comments)    Pt doesn't like side effects  . Quetiapine Other (See Comments)    restlessness  . Penicillins Hives and Rash  . Sulfa Antibiotics Hives and Rash  . Sulfamethoxazole Rash and Hives     Current Outpatient Prescriptions:  .  ARIPiprazole (ABILIFY) 5 MG tablet, Take 1 tablet (5 mg total) by mouth daily., Disp: 30 tablet, Rfl: 2 .  desonide (DESOWEN) 0.05 % cream, , Disp: , Rfl:  .  mirtazapine (REMERON) 15 MG tablet, Take 1 tablet (15 mg total) by mouth at bedtime., Disp: 30 tablet, Rfl: 2 .  Multiple Vitamin (MULTIVITAMIN WITH MINERALS) TABS, Take 1 tablet by mouth every morning., Disp: , Rfl:  .  venlafaxine XR (EFFEXOR-XR) 75 MG 24 hr capsule, Take 1 capsule (75 mg total) by mouth daily with breakfast., Disp: 30 capsule, Rfl: 2 .  hydrOXYzine (ATARAX/VISTARIL) 50 MG tablet, Take 1 tablet every 8 hours as needed for anxiety or panic attacks (Patient not taking: Reported on 09/12/2016), Disp: 90 tablet, Rfl: 1 .  loratadine (CLARITIN) 10 MG tablet, Take 10 mg by mouth daily as needed for allergies., Disp: , Rfl:   Vitals:   09/12/16 1422  BP: 133/85  Pulse: (!) 110  Resp: 18  Temp: 99.1 F (37.3 C)  SpO2:  100%  Weight: 224 lb (101.6 kg)  Height: 5\' 6"  (1.676 m)    Body mass index is 36.15 kg/m.         Review of Systems Denies chest pain, dyspnea on exertion, PND, orthopnea, hemoptysis. Has history of GERD    Objective:   Physical Exam BP 133/85 (BP Location: Left Arm, Patient Position: Sitting, Cuff Size: Normal)   Pulse (!) 110   Temp 99.1 F (37.3 C)   Resp 18   Ht 5\' 6"  (1.676 m)   Wt 224 lb (101.6 kg)   SpO2 100%   BMI 36.15 kg/m     Gen.-alert and oriented x3 in no apparent distress HEENT normal for age Lungs no rhonchi or wheezing Cardiovascular regular rhythm no murmurs carotid pulses 3+ palpable no bruits audible Abdomen soft nontender no palpable masses Musculoskeletal free of  major deformities Skin clear -no rashes Neurologic  normal Lower extremities 3+ femoral and dorsalis pedis pulses palpable bilaterally with no edema There are patches of telangiectasia both medially and laterally in the thigh areas and calf areas and a few prominent veins in the posterior oh medial thigh adjacent to the popliteal fossa. These are not tender to palpation and not the exact areas were patient's symptoms arise. There are no ulceration or hyperpigmentation areas noted. No bulging varicosities are noted.  Today I reviewed the previous ultrasound performed in 2014 and performed a bedside SonoSite ultrasound exam. Both great saphenous veins are fairly normal in size with minimal reflux.      Assessment:     Bilateral spider veins and reticular veins with no evidence of significant great saphenous vein enlargement with reflux bilaterally on bedside exam Do not feel that the patient's leg symptoms and the posterior thigh are related to his venous disease    Plan:     If patient continues to have heaviness would recommend elevate foot of bed 2-3 inches and were short leg elastic compression stockings 20-30 millimeter gradient Discuss this with patient and he says his symptoms are not bad enough to do that the present time Return on a when necessary basis-no intervention indicated He is not interested in sclerotherapy for these telangiectasia

## 2016-09-19 ENCOUNTER — Ambulatory Visit (INDEPENDENT_AMBULATORY_CARE_PROVIDER_SITE_OTHER): Payer: Medicare Other | Admitting: Psychiatry

## 2016-09-19 ENCOUNTER — Encounter (HOSPITAL_COMMUNITY): Payer: Self-pay | Admitting: Psychiatry

## 2016-09-19 DIAGNOSIS — F428 Other obsessive-compulsive disorder: Secondary | ICD-10-CM

## 2016-09-19 DIAGNOSIS — Z818 Family history of other mental and behavioral disorders: Secondary | ICD-10-CM | POA: Diagnosis not present

## 2016-09-19 DIAGNOSIS — F422 Mixed obsessional thoughts and acts: Secondary | ICD-10-CM

## 2016-09-19 DIAGNOSIS — F952 Tourette's disorder: Secondary | ICD-10-CM | POA: Diagnosis not present

## 2016-09-19 MED ORDER — ARIPIPRAZOLE 5 MG PO TABS: 5.0000 mg | ORAL_TABLET | Freq: Every day | ORAL | 0 refills | Status: DC

## 2016-09-19 MED ORDER — MIRTAZAPINE 15 MG PO TABS: 15.0000 mg | ORAL_TABLET | Freq: Every day | ORAL | 0 refills | Status: DC

## 2016-09-19 MED ORDER — VENLAFAXINE HCL ER 75 MG PO CP24: 75.0000 mg | ORAL_CAPSULE | Freq: Every day | ORAL | 0 refills | Status: DC

## 2016-09-19 NOTE — Progress Notes (Signed)
[place] MD/PA/NP OP Progress Note  09/19/2016 1:45 PM Bruce Ellison  MRN:  [id number]  Chief Complaint:  Subjective:  Some time I get nervous and anxious.  My grandpa is not doing good he is 36 year old.  HPI: Patient came for his follow-up appointment.  He is taking his medication and reported no side effects.  Recently he has noticed anxiety nervousness and panic attacks.  He is very close to his grandpa who is 24 year old and not doing very well.  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  His depression and OCD is stable on his current psychiatric medication.  He has no tremors, shakes or any EPS.  He denies any mania, psychosis or any hallucination.  His appetite is okay.  He denies any suicidal thoughts or homicidal thought.  He was given Vistaril by covering psychiatrist which he took to help anxiety.  He's not taking Vistaril but some nights and days he needed.  Patient denies drinking alcohol or using any illegal substances.  He lives with his mother and his grandfather.  Visit Diagnosis:    ICD-10-CM   1. Other obsessive-compulsive disorders F42.8 venlafaxine XR (EFFEXOR-XR) 75 MG 24 hr capsule    mirtazapine (REMERON) 15 MG tablet  2. Mixed obsessional thoughts and acts F42.2 ARIPiprazole (ABILIFY) 5 MG tablet    Past Psychiatric History: Reviewed. Patient has history of psychiatric illness at age 82. At that time he was in [place]. He has Tic, anxiety and anger. He has history of paranoia. In the past he had tried Geodon, Risperdal and Seroquel. He was seeing psychiatrist at [facility]. In the past he was also to get treatment in [place]. He was seeing psychiatrist in [place] in [place]. He&amp;#39;s been taking the medication since age 81. Recently his been seeing Dr. Toy Care who had prescribed her Zoloft, Valium, Klonopin, Xanax, Prozac and Ambien. We also tried luvox however he developed more irritability.  Past Medical History:  Past Medical  History:  Diagnosis Date  . Adenomatous colon polyp    tubular  . Allergic rhinitis   . Anxiety   . Depression   . GERD (gastroesophageal reflux disease)   . Glucose intolerance (impaired glucose tolerance)   . HTN (hypertension)    borderline  . Insomnia   . Obesity   . OCD (obsessive compulsive disorder)   . Personality disorder    with schizoid features  . Sleep apnea   . Tourette's    per [facility]  . Varicose veins of both lower extremities   . Vitamin D deficiency     Past Surgical History:  Procedure Laterality Date  . adnoidectomy  1992    Family Psychiatric History: Reviewed.  Family History:  Family History  Problem Relation Age of Onset  . Depression Mother   . Bipolar disorder Father   . Colon cancer Other   . Cholelithiasis Maternal Grandmother   . Diabetes Maternal Grandmother   . Prostate cancer Maternal Grandfather   . Colon polyps Maternal Grandfather   . Esophageal cancer Neg Hx   . Rectal cancer Neg Hx   . Stomach cancer Neg Hx     Social History:  Social History   Social History  . Marital status: Single    Spouse name: N/A  . Number of children: 0  . Years of education: N/A   Occupational History  . unemployed    Social History Main Topics  . Smoking  status: Never Smoker  . Smokeless tobacco: Never Used  . Alcohol use No  . Drug use: No  . Sexual activity: Not Currently   Other Topics Concern  . Not on file   Social History Narrative   Lives with parents.   Regular exercise- No    Allergies:  Allergies  Allergen Reactions  . Honey Bee Treatment [Bee Venom] Anaphylaxis, Hives and Rash    Pt reports he does not go into anaphylaxis with bee stings, just honey  . Doxycycline   . Morphine Other (See Comments)    Pt doesn't like side effects  . Quetiapine Other (See Comments)    restlessness  . Penicillins Hives and Rash  . Sulfa Antibiotics Hives and Rash  . Sulfamethoxazole Rash and Hives    Metabolic Disorder  Labs: Lab Results  Component Value Date   HGBA1C 5.6 04/13/2016   No results found for: PROLACTIN Lab Results  Component Value Date   CHOL 193 03/10/2015   TRIG 104.0 03/10/2015   HDL 42.40 03/10/2015   CHOLHDL 5 03/10/2015   VLDL 20.8 03/10/2015   LDLCALC 130 (H) 03/10/2015   LDLCALC 130 (H) 05/02/2006     Current Medications: Current Outpatient Prescriptions  Medication Sig Dispense Refill  . ARIPiprazole (ABILIFY) 5 MG tablet Take 1 tablet (5 mg total) by mouth daily. 30 tablet 2  . desonide (DESOWEN) 0.05 % cream     . hydrOXYzine (ATARAX/VISTARIL) 50 MG tablet Take 1 tablet every 8 hours as needed for anxiety or panic attacks (Patient not taking: Reported on 09/12/2016) 90 tablet 1  . loratadine (CLARITIN) 10 MG tablet Take 10 mg by mouth daily as needed for allergies.    . mirtazapine (REMERON) 15 MG tablet Take 1 tablet (15 mg total) by mouth at bedtime. 30 tablet 2  . Multiple Vitamin (MULTIVITAMIN WITH MINERALS) TABS Take 1 tablet by mouth every morning.    . venlafaxine XR (EFFEXOR-XR) 75 MG 24 hr capsule Take 1 capsule (75 mg total) by mouth daily with breakfast. 30 capsule 2   No current facility-administered medications for this visit.     Neurologic: Headache: No Seizure: No Paresthesias: No  Musculoskeletal: Strength & Muscle Tone: within normal limits Gait & Station: normal Patient leans: N/A  Psychiatric Specialty Exam: ROS  Blood pressure 127/81, pulse 94, height 5\' 6"  (1.676 m), weight 225 lb (102.1 kg).There is no height or weight on file to calculate BMI.  General Appearance: Casual  Eye Contact:  Good  Speech:  Clear and Coherent  Volume:  Normal  Mood:  Anxious  Affect:  Appropriate  Thought Process:  Goal Directed  Orientation:  Full (Time, Place, and Person)  Thought Content: Logical   Suicidal Thoughts:  No  Homicidal Thoughts:  No  Memory:  Immediate;   Good Recent;   Good Remote;   Good  Judgement:  Good  Insight:  Good   Psychomotor Activity:  Normal  Concentration:  Concentration: Good and Attention Span: Good  Recall:  Good  Fund of Knowledge: Good  Language: Good  Akathisia:  No  Handed:  Right  AIMS (if indicated):  0  Assets:  Communication Skills Desire for Improvement Housing Physical Health Resilience Social Support  ADL's:  Intact  Cognition: WNL  Sleep:  Okay    Assessment: Obsessive-compulsive disorder.  Anxiety disorder NOS.  Tourette's syndrome  Plan: Patient is a stable on his current medication with the exception of occasional anxiety and nervousness.  He is taking  a Vistaril for that.  I will continue Effexor 75 mg daily, Remeron 50 mg at bedtime and Abilify 5 mg daily.  Patient still has refill remaining on his Vistaril.  Discussed medication side effects and benefits.  Recommended to call us back if he has any question, concern or if he feel worsening of the symptom.  Follow-up in 3 months.    Asha Grumbine T., MD 09/19/2016, 1:45 PM

## 2016-10-13 ENCOUNTER — Encounter: Payer: Self-pay | Admitting: Internal Medicine

## 2016-10-13 ENCOUNTER — Ambulatory Visit (INDEPENDENT_AMBULATORY_CARE_PROVIDER_SITE_OTHER): Payer: Medicare Other | Admitting: Internal Medicine

## 2016-10-13 DIAGNOSIS — Z23 Encounter for immunization: Secondary | ICD-10-CM | POA: Diagnosis not present

## 2016-10-13 NOTE — Progress Notes (Signed)
Subjective:  Patient ID: Bruce Ellison, male    DOB: 1981/02/11  Age: 36 y.o. MRN: [id number]  CC: No chief complaint on file.   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 ARIPiprazole (ABILIFY) 5 MG tablet Take 1 tablet (5 mg total) by mouth daily. 90 tablet 0  . desonide (DESOWEN) 0.05 % cream     . hydrOXYzine (ATARAX/VISTARIL) 50 MG tablet Take 1 tablet every 8 hours as needed for anxiety or panic attacks 90 tablet 1  . loratadine (CLARITIN) 10 MG tablet Take 10 mg by mouth daily as needed for allergies.    . mirtazapine (REMERON) 15 MG tablet Take 1 tablet (15 mg total) by mouth at bedtime. 90 tablet 0  . Multiple Vitamin (MULTIVITAMIN WITH MINERALS) TABS Take 1 tablet by mouth every morning.    . venlafaxine XR (EFFEXOR-XR) 75 MG 24 hr capsule Take 1 capsule (75 mg total) by mouth daily with breakfast. 90 capsule 0   No facility-administered medications prior to visit.     ROS Review of Systems  Constitutional: Negative for fever.  HENT: Negative for congestion.   Respiratory: Negative for cough.   Skin: Negative for rash.    Objective:  BP 112/78 (BP Location: Left Arm, Patient Position: Sitting, Cuff Size: Large)   Pulse 97   Temp 98.8 F (37.1 C) (Oral)   Ht 5\' 6"  (1.676 m)   Wt 229 lb (103.9 kg)   SpO2 99%   BMI 36.96 kg/m   BP Readings from Last 3 Encounters:  10/13/16 112/78  09/12/16 133/85  06/19/16 118/66    Wt Readings from Last 3 Encounters:  10/13/16 229 lb (103.9 kg)  09/12/16 224 lb (101.6 kg)  06/19/16 233 lb (105.7 kg)    Physical Exam  Constitutional: He is oriented to person, place, and time. He appears well-developed. No distress.  NAD  HENT:  Mouth/Throat: Oropharynx is clear and moist.  Eyes: Pupils are equal, round, and reactive to light. Conjunctivae are normal.  Neck: Normal range of motion. No JVD  present. No thyromegaly present.  Cardiovascular: Normal rate, regular rhythm, normal heart sounds and intact distal pulses.  Exam reveals no gallop and no friction rub.   No murmur heard. Pulmonary/Chest: Effort normal and breath sounds normal. No respiratory distress. He has no wheezes. He has no rales. He exhibits no tenderness.  Abdominal: Soft. Bowel sounds are normal. He exhibits no distension and no mass. There is no tenderness. There is no rebound and no guarding.  Musculoskeletal: Normal range of motion. He exhibits no edema or tenderness.  Lymphadenopathy:    He has no cervical adenopathy.  Neurological: He is alert and oriented to person, place, and time. He has normal reflexes. No cranial nerve deficit. He exhibits normal muscle tone. He displays a negative Romberg sign. Coordination and gait normal.  Skin: Skin is warm and dry. No rash noted.  Psychiatric: He has a normal mood and affect. His behavior is normal. Judgment and thought content normal.   I spent total of 15 minutes face-to-face with patient and greater than 50% was spent counseling on Varicella vaccination   Lab Results  Component Value Date   WBC 5.4 04/13/2016   HGB 17.1 (H) 04/13/2016   HCT 50.1 04/13/2016   PLT 218.0 04/13/2016   GLUCOSE 93 04/13/2016   CHOL  193 03/10/2015   TRIG 104.0 03/10/2015   HDL 42.40 03/10/2015   LDLDIRECT 136.9 08/30/2010   LDLCALC 130 (H) 03/10/2015   ALT 44 04/13/2016   AST 27 04/13/2016   NA 141 04/13/2016   K 4.0 04/13/2016   CL 107 04/13/2016   CREATININE 0.93 04/13/2016   BUN 10 04/13/2016   CO2 27 04/13/2016   TSH 1.09 03/10/2015   PSA 0.65 03/10/2015   HGBA1C 5.6 04/13/2016    US Abdomen Complete  Result Date: 12/16/2013 CLINICAL DATA:  35 year old male with recurrent right upper quadrant abdominal pain. Subsequent encounter. EXAM: ULTRASOUND ABDOMEN COMPLETE COMPARISON:  Thoracic spine MRI 06/04/2013. FINDINGS: Gallbladder: No gallstones or wall thickening  visualized. No sonographic Murphy sign noted. Common bile duct: Diameter: 5 mm, normal Liver: No focal lesion identified. Within normal limits in parenchymal echogenicity. No intrahepatic biliary ductal dilatation. IVC: No abnormality visualized. Pancreas: Visualized portion unremarkable. Spleen: Size and appearance within normal limits. Right Kidney: Length: 11.7 cm. Echogenicity within normal limits. No mass or hydronephrosis visualized. Left Kidney: Length: 12.1 cm. Small midpole region cortical cyst measuring up to 10 mm appears simple and unchanged. Echogenicity within normal limits. No mass or hydronephrosis visualized. Abdominal aorta: No aneurysm visualized. Other findings: None. IMPRESSION: Normal gallbladder.  Normal abdominal ultrasound. Electronically Signed   By: Lars Pinks M.D.   On: 12/16/2013 09:13    Assessment & Plan:   There are no diagnoses linked to this encounter. I am having Mr. Gaiser maintain his multivitamin with minerals, desonide, loratadine, hydrOXYzine, venlafaxine XR, mirtazapine, and ARIPiprazole.  No orders of the defined types were placed in this encounter.    Follow-up: No Follow-up on file.  Walker Kehr, MD

## 2016-10-13 NOTE — Assessment & Plan Note (Signed)
Varicella vaccine now Repeat in 6-8 wks

## 2016-10-13 NOTE — Addendum Note (Signed)
Addended by: Karren Cobble on: 10/13/2016 02:58 PM   Modules accepted: Orders

## 2016-10-13 NOTE — Patient Instructions (Signed)
Repeat Varicella shot in 6-8 wks

## 2016-12-04 ENCOUNTER — Encounter: Payer: Self-pay | Admitting: Internal Medicine

## 2016-12-06 ENCOUNTER — Ambulatory Visit: Payer: Self-pay

## 2016-12-07 ENCOUNTER — Telehealth: Payer: Self-pay | Admitting: Internal Medicine

## 2016-12-07 NOTE — Telephone Encounter (Signed)
letter

## 2016-12-20 ENCOUNTER — Ambulatory Visit (HOSPITAL_COMMUNITY): Payer: Self-pay | Admitting: Psychiatry

## 2016-12-21 ENCOUNTER — Other Ambulatory Visit (HOSPITAL_COMMUNITY): Payer: Self-pay | Admitting: Psychiatry

## 2016-12-21 DIAGNOSIS — F428 Other obsessive-compulsive disorder: Secondary | ICD-10-CM

## 2016-12-21 DIAGNOSIS — F422 Mixed obsessional thoughts and acts: Secondary | ICD-10-CM

## 2016-12-26 ENCOUNTER — Telehealth (HOSPITAL_COMMUNITY): Payer: Self-pay

## 2016-12-26 NOTE — Telephone Encounter (Signed)
Pt want refill on Abilify, Mirtazapine, and Effexor. Please review and advise.

## 2016-12-27 ENCOUNTER — Other Ambulatory Visit (HOSPITAL_COMMUNITY): Payer: Self-pay

## 2016-12-27 ENCOUNTER — Other Ambulatory Visit (HOSPITAL_COMMUNITY): Payer: Self-pay | Admitting: Psychiatry

## 2016-12-27 DIAGNOSIS — F422 Mixed obsessional thoughts and acts: Secondary | ICD-10-CM

## 2016-12-27 DIAGNOSIS — F428 Other obsessive-compulsive disorder: Secondary | ICD-10-CM

## 2016-12-27 MED ORDER — MIRTAZAPINE 15 MG PO TABS: 15.0000 mg | ORAL_TABLET | Freq: Every day | ORAL | 0 refills | Status: DC

## 2016-12-27 MED ORDER — ARIPIPRAZOLE 5 MG PO TABS: 5.0000 mg | ORAL_TABLET | Freq: Every day | ORAL | 0 refills | Status: DC

## 2016-12-27 MED ORDER — VENLAFAXINE HCL ER 75 MG PO CP24: 75.0000 mg | ORAL_CAPSULE | Freq: Every day | ORAL | 0 refills | Status: DC

## 2017-01-02 ENCOUNTER — Ambulatory Visit (HOSPITAL_COMMUNITY): Payer: Self-pay | Admitting: Psychiatry

## 2017-01-22 ENCOUNTER — Other Ambulatory Visit (HOSPITAL_COMMUNITY): Payer: Self-pay | Admitting: Psychiatry

## 2017-01-22 DIAGNOSIS — F428 Other obsessive-compulsive disorder: Secondary | ICD-10-CM

## 2017-02-01 ENCOUNTER — Encounter (HOSPITAL_COMMUNITY): Payer: Self-pay | Admitting: Psychiatry

## 2017-02-01 ENCOUNTER — Ambulatory Visit (INDEPENDENT_AMBULATORY_CARE_PROVIDER_SITE_OTHER): Payer: Medicare Other | Admitting: Psychiatry

## 2017-02-01 DIAGNOSIS — F428 Other obsessive-compulsive disorder: Secondary | ICD-10-CM

## 2017-02-01 DIAGNOSIS — Z818 Family history of other mental and behavioral disorders: Secondary | ICD-10-CM

## 2017-02-01 DIAGNOSIS — Z79899 Other long term (current) drug therapy: Secondary | ICD-10-CM | POA: Diagnosis not present

## 2017-02-01 DIAGNOSIS — F952 Tourette's disorder: Secondary | ICD-10-CM

## 2017-02-01 DIAGNOSIS — F422 Mixed obsessional thoughts and acts: Secondary | ICD-10-CM | POA: Diagnosis not present

## 2017-02-01 DIAGNOSIS — F419 Anxiety disorder, unspecified: Secondary | ICD-10-CM | POA: Diagnosis not present

## 2017-02-01 MED ORDER — VENLAFAXINE HCL ER 75 MG PO CP24: 75.0000 mg | ORAL_CAPSULE | Freq: Every day | ORAL | 0 refills | Status: DC

## 2017-02-01 MED ORDER — ARIPIPRAZOLE 5 MG PO TABS: 5.0000 mg | ORAL_TABLET | Freq: Every day | ORAL | 0 refills | Status: DC

## 2017-02-01 MED ORDER — MIRTAZAPINE 15 MG PO TABS: 15.0000 mg | ORAL_TABLET | Freq: Every day | ORAL | 0 refills | Status: DC

## 2017-02-01 NOTE — Progress Notes (Signed)
[name] OP Progress Note  02/01/2017 3:11 PM Bruce Ellison  MRN:  [id number]  Chief Complaint: I am helping my grandfather.  He is sick.  He is 36 years old.  HPI: She came for his follow-up appointment.  He is taking his medication for some time he feels anxious.  He is worried about his grandfather who is 64 year old and has been sick.  Patient is not working because he is taking care of his grandfather.  Overall he describes his mood is okay and medicine working.  He denies any crying spells, feeling of hopelessness or worthlessness.  His OCD is under control.  He denies any mania, psychosis, hallucination or any paranoia.  He has not taken Vistaril in recent months.  He is sleeping good.  He denies drinking alcohol or using any illegal substances.  He has a quite Thanksgiving.  He lives with his mother and grandfather.  Visit Diagnosis:    ICD-10-CM   1. Other obsessive-compulsive disorders F42.8 venlafaxine XR (EFFEXOR-XR) 75 MG 24 hr capsule    mirtazapine (REMERON) 15 MG tablet  2. Mixed obsessional thoughts and acts F42.2 ARIPiprazole (ABILIFY) 5 MG tablet    Past Psychiatric History: Reviewed. Patient has history of psychiatric illness at age 15. At that time he was in [place]. He has Tic, anxiety and anger. He has history of paranoia. In the past he had tried Geodon, Risperdal and Seroquel. He was seeing psychiatrist at [facility]. In the past he was also to get treatment in [place]. He was seeing psychiatrist in [place] in [place]. He&amp;#39;s been taking the medication since age 13. Recently his been seeing Dr. Toy Care who had prescribed her Zoloft, Valium, Klonopin, Xanax, Prozac and Ambien. We also tried luvox however he developed more irritability.  Past Medical History:  Past Medical History:  Diagnosis Date  . Adenomatous colon polyp    tubular  . Allergic rhinitis   . Anxiety   . Depression   . GERD (gastroesophageal reflux disease)   . Glucose  intolerance (impaired glucose tolerance)   . HTN (hypertension)    borderline  . Insomnia   . Obesity   . OCD (obsessive compulsive disorder)   . Personality disorder ([place])    with schizoid features  . Sleep apnea   . Tourette's    per [facility]  . Varicose veins of both lower extremities   . Vitamin D deficiency     Past Surgical History:  Procedure Laterality Date  . adnoidectomy  1992    Family Psychiatric History: Reviewed.  Family History:  Family History  Problem Relation Age of Onset  . Depression Mother   . Bipolar disorder Father   . Colon cancer Other   . Cholelithiasis Maternal Grandmother   . Diabetes Maternal Grandmother   . Prostate cancer Maternal Grandfather   . Colon polyps Maternal Grandfather   . Esophageal cancer Neg Hx   . Rectal cancer Neg Hx   . Stomach cancer Neg Hx     Social History:  Social History   Socioeconomic History  . Marital status: Single    Spouse name: None  . Number of children: 0  . Years of education: None  . Highest education level: None  Social Needs  . Financial resource strain: None  . Food insecurity - worry: None  . Food insecurity - inability: None  . Transportation needs - medical: None  . Transportation needs - non-medical: None  Occupational History  .  Occupation: unemployed  Tobacco Use  . Smoking status: Never Smoker  . Smokeless tobacco: Never Used  Substance and Sexual Activity  . Alcohol use: No    Alcohol/week: 0.0 oz  . Drug use: No  . Sexual activity: Not Currently  Other Topics Concern  . None  Social History Narrative   Lives with parents.   Regular exercise- No    Allergies:  Allergies  Allergen Reactions  . Honey Bee Treatment [Bee Venom] Anaphylaxis, Hives and Rash    Pt reports he does not go into anaphylaxis with bee stings, just honey  . Doxycycline   . Morphine Other (See Comments)    Pt doesn't like side effects  . Quetiapine Other (See Comments)    restlessness  .  Penicillins Hives and Rash  . Sulfa Antibiotics Hives and Rash  . Sulfamethoxazole Rash and Hives    Metabolic Disorder Labs: Lab Results  Component Value Date   HGBA1C 5.6 04/13/2016   No results found for: PROLACTIN Lab Results  Component Value Date   CHOL 193 03/10/2015   TRIG 104.0 03/10/2015   HDL 42.40 03/10/2015   CHOLHDL 5 03/10/2015   VLDL 20.8 03/10/2015   LDLCALC 130 (H) 03/10/2015   LDLCALC 130 (H) 05/02/2006   Lab Results  Component Value Date   TSH 1.09 03/10/2015   TSH 1.14 09/15/2014    Therapeutic Level Labs: No results found for: LITHIUM No results found for: VALPROATE No components found for:  CBMZ  Current Medications: Current Outpatient Medications  Medication Sig Dispense Refill  . ARIPiprazole (ABILIFY) 5 MG tablet Take 1 tablet (5 mg total) by mouth daily. 30 tablet 0  . desonide (DESOWEN) 0.05 % cream     . mirtazapine (REMERON) 15 MG tablet Take 1 tablet (15 mg total) by mouth at bedtime. 30 tablet 0  . Multiple Vitamin (MULTIVITAMIN WITH MINERALS) TABS Take 1 tablet by mouth every morning.    . venlafaxine XR (EFFEXOR-XR) 75 MG 24 hr capsule Take 1 capsule (75 mg total) by mouth daily with breakfast. 30 capsule 0  . hydrOXYzine (ATARAX/VISTARIL) 50 MG tablet Take 1 tablet every 8 hours as needed for anxiety or panic attacks (Patient not taking: Reported on 02/01/2017) 90 tablet 1  . loratadine (CLARITIN) 10 MG tablet Take 10 mg by mouth daily as needed for allergies.     No current facility-administered medications for this visit.      Musculoskeletal: Strength & Muscle Tone: within normal limits Gait & Station: normal Patient leans: N/A  Psychiatric Specialty Exam: ROS  Blood pressure 126/80, pulse (!) 102, height 5\' 6"  (1.676 m), weight 235 lb 3.2 oz (106.7 kg).Body mass index is 37.96 kg/m.  General Appearance: Casual  Eye Contact:  Good  Speech:  Slow  Volume:  Normal  Mood:  Euthymic  Affect:  Congruent  Thought Process:   Goal Directed  Orientation:  Full (Time, Place, and Person)  Thought Content: Logical   Suicidal Thoughts:  No  Homicidal Thoughts:  No  Memory:  Immediate;   Good Recent;   Good Remote;   Good  Judgement:  Good  Insight:  Good  Psychomotor Activity:  Normal  Concentration:  Concentration: Good and Attention Span: Good  Recall:  Good  Fund of Knowledge: Good  Language: Good  Akathisia:  No  Handed:  Right  AIMS (if indicated): not done  Assets:  Communication Skills Desire for Improvement Housing Resilience Social Support Transportation  ADL's:  Intact  Cognition:  WNL  Sleep:  Good   Screenings:   Assessment and Plan: Obsessive-compulsive disorder.  Anxiety disorder NOS.  Tourette's syndrome.  Reassurance given.  Patient is a stable on his current psychiatric medication.  Continue Remeron 15 mg at bedtime, Abilify 5 mg daily and Effexor 75 mg daily.  Reassurance given.  Recommended counseling but patient declined.  He does not need a new prescription of Vistaril.  Recommended to call us back if he has any question or any concern.  Follow-up in 3 months.   Kathlee Nations, MD 02/01/2017, 3:11 PM

## 2017-04-03 ENCOUNTER — Encounter: Payer: Self-pay | Admitting: Internal Medicine

## 2017-04-03 ENCOUNTER — Ambulatory Visit (INDEPENDENT_AMBULATORY_CARE_PROVIDER_SITE_OTHER): Payer: Medicare Other | Admitting: Internal Medicine

## 2017-04-03 ENCOUNTER — Other Ambulatory Visit (INDEPENDENT_AMBULATORY_CARE_PROVIDER_SITE_OTHER): Payer: Medicare Other

## 2017-04-03 VITALS — BP 132/88 | HR 122 | Temp 98.1°F | Resp 16 | Wt 240.0 lb

## 2017-04-03 DIAGNOSIS — R000 Tachycardia, unspecified: Secondary | ICD-10-CM

## 2017-04-03 DIAGNOSIS — J029 Acute pharyngitis, unspecified: Secondary | ICD-10-CM | POA: Diagnosis not present

## 2017-04-03 LAB — COMPREHENSIVE METABOLIC PANEL
ALT: 42 U/L (ref 0–53)
AST: 24 U/L (ref 0–37)
Albumin: 4.4 g/dL (ref 3.5–5.2)
Alkaline Phosphatase: 64 U/L (ref 39–117)
BUN: 14 mg/dL (ref 6–23)
CHLORIDE: 104 meq/L (ref 96–112)
CO2: 29 meq/L (ref 19–32)
CREATININE: 0.89 mg/dL (ref 0.40–1.50)
Calcium: 9.4 mg/dL (ref 8.4–10.5)
GFR: 102.44 mL/min (ref >60.00)
GLUCOSE: 148 mg/dL — AB (ref 70–99)
Potassium: 4.3 mEq/L (ref 3.5–5.1)
SODIUM: 140 meq/L (ref 135–145)
Total Bilirubin: 0.4 mg/dL (ref 0.2–1.2)
Total Protein: 7.4 g/dL (ref 6.0–8.3)

## 2017-04-03 LAB — CBC WITH DIFFERENTIAL/PLATELET
BASOS PCT: 0.8 % (ref 0.0–3.0)
Basophils Absolute: 0 10*3/uL (ref 0.0–0.1)
EOS ABS: 0.3 10*3/uL (ref 0.0–0.7)
Eosinophils Relative: 5.4 % — ABNORMAL HIGH (ref 0.0–5.0)
HEMATOCRIT: 49 % (ref 39.0–52.0)
Hemoglobin: 16.9 g/dL (ref 13.0–17.0)
LYMPHS ABS: 1.6 10*3/uL (ref 0.7–4.0)
LYMPHS PCT: 27.2 % (ref 12.0–46.0)
MCHC: 34.5 g/dL (ref 30.0–36.0)
MCV: 88.1 fl (ref 78.0–100.0)
MONO ABS: 0.7 10*3/uL (ref 0.1–1.0)
Monocytes Relative: 11.8 % (ref 3.0–12.0)
NEUTROS ABS: 3.2 10*3/uL (ref 1.4–7.7)
NEUTROS PCT: 54.8 % (ref 43.0–77.0)
PLATELETS: 262 10*3/uL (ref 150.0–400.0)
RBC: 5.57 Mil/uL (ref 4.22–5.81)
RDW: 13.6 % (ref 11.5–15.5)
WBC: 5.9 10*3/uL (ref 4.0–10.5)

## 2017-04-03 LAB — POCT INFLUENZA A/B
INFLUENZA A, POC: NEGATIVE
INFLUENZA B, POC: NEGATIVE

## 2017-04-03 LAB — TSH: TSH: 1.15 u[IU]/mL (ref 0.35–4.50)

## 2017-04-03 LAB — POCT RAPID STREP A (OFFICE): Rapid Strep A Screen: NEGATIVE

## 2017-04-03 NOTE — Patient Instructions (Addendum)
  Test(s) ordered today. Your results will be released to [facility] (or called to you) after review, usually within 72hours after test completion. If any changes need to be made, you will be notified at that same time.  Your flu and strep tests were negative.  An EKG was done today.   Medications reviewed and updated.  No changes recommended at this time.  Call if no improvement

## 2017-04-03 NOTE — Assessment & Plan Note (Signed)
Sore throat started this morning when he woke up associated with a feeling of something in his throat and possibly some mild difficulty swallowing.  No other cold symptoms Rapid strep negative, flu test negative Possibly viral or atypical GERD although I would expect the symptoms to resolve by now Advise treating symptomatically Call or return if no improvement

## 2017-04-03 NOTE — Progress Notes (Signed)
Subjective:    Patient ID: Bruce Ellison, male    DOB: October 26, 1980, 37 y.o.   MRN: [id number]  HPI He is here for an acute visit.   This morning he woke up and it felt like something was stuck in his throat.   He initially felt like he had mucus there and was trying to spit it out, but he was not able to.  He states he also has a very mild sore throat.  He denies fever, chills, cough, nasal congestion, ear pain or sinus pain.  He states he did look in the back of his throat and his uvula was touching his tongue and he wondered if that was causing the problem.  He states that his symptoms have been constant since he woke up.  He has not taken anything for the throat.   Heart racing: He states he started having heart racing approximately 2 hours ago.  States it is worse with walking.  He is also noticed some lightheadedness and shortness of breath today.  He denies any changes in medications, excess caffeine intake and feels he is drinking a good amount of fluids.  He wondered if maybe it was related to anxiety.  He denies prior episodes, but looking back on his heart rate has been high several times in the past. Sob started today.  He denies any chest pain or ankle swelling.  Medications and allergies reviewed with patient and updated if appropriate.  Patient Active Problem List   Diagnosis Date Noted  . Varicella vaccination 10/13/2016  . Spider veins of both lower extremities 09/12/2016  . Acute pharyngitis 08/19/2015  . Hyperlipidemia 03/10/2015  . Nonallopathic lesion-rib cage 12/31/2013  . Nonallopathic lesion of lumbosacral region 12/31/2013  . Sleep difficulties 12/28/2013  . Diarrhea 10/10/2013  . Cervical disc disorder with radiculopathy of cervical region 06/11/2013  . Acute thoracic back pain 05/28/2013  . Muscle spasm of back 03/24/2013  . Nonallopathic lesion of thoracic region 03/24/2013  . Finger wound, simple, open 12/08/2012  . Abdominal distention 12/06/2012  .  Sinusitis nasal 06/04/2012  . Hyperglycemia 06/04/2012  . Weight gain 06/04/2012  . Severe episode of recurrent major depressive disorder ([place]) 05/28/2012  . Generalized anxiety disorder 05/28/2012  . Varicose veins of lower extremities with other complications 99/83/3825  . OSA (obstructive sleep apnea) 02/14/2011  . Polycythemia 12/14/2010  . Cerumen impaction 06/24/2010  . VITAMIN D DEFICIENCY 12/25/2008  . SEBORRHEIC DERMATITIS 07/23/2008  . FREQUENCY, URINARY 07/23/2008  . CELLULITIS/ABSCESS, FOOT 09/11/2007  . GLUCOSE INTOLERANCE 04/11/2007  . Allergic rhinitis 04/11/2007  . GERD 04/11/2007  . Backache 04/11/2007    Current Outpatient Medications on File Prior to Visit  Medication Sig Dispense Refill  . ARIPiprazole (ABILIFY) 5 MG tablet Take 1 tablet (5 mg total) by mouth daily. 90 tablet 0  . desonide (DESOWEN) 0.05 % cream     . hydrOXYzine (ATARAX/VISTARIL) 50 MG tablet Take 1 tablet every 8 hours as needed for anxiety or panic attacks 90 tablet 1  . loratadine (CLARITIN) 10 MG tablet Take 10 mg by mouth daily as needed for allergies.    . mirtazapine (REMERON) 15 MG tablet Take 1 tablet (15 mg total) by mouth at bedtime. 90 tablet 0  . Multiple Vitamin (MULTIVITAMIN WITH MINERALS) TABS Take 1 tablet by mouth every morning.    . venlafaxine XR (EFFEXOR-XR) 75 MG 24 hr capsule Take 1 capsule (75 mg total) by mouth daily with breakfast. 90 capsule 0  No current facility-administered medications on file prior to visit.     Past Medical History:  Diagnosis Date  . Adenomatous colon polyp    tubular  . Allergic rhinitis   . Anxiety   . Depression   . GERD (gastroesophageal reflux disease)   . Glucose intolerance (impaired glucose tolerance)   . HTN (hypertension)    borderline  . Insomnia   . Obesity   . OCD (obsessive compulsive disorder)   . Personality disorder ([place])    with schizoid features  . Sleep apnea   . Tourette's    per [facility]  . Varicose veins  of both lower extremities   . Vitamin D deficiency     Past Surgical History:  Procedure Laterality Date  . adnoidectomy  1992    Social History   Socioeconomic History  . Marital status: Single    Spouse name: None  . Number of children: 0  . Years of education: None  . Highest education level: None  Social Needs  . Financial resource strain: None  . Food insecurity - worry: None  . Food insecurity - inability: None  . Transportation needs - medical: None  . Transportation needs - non-medical: None  Occupational History  . Occupation: unemployed  Tobacco Use  . Smoking status: Never Smoker  . Smokeless tobacco: Never Used  Substance and Sexual Activity  . Alcohol use: No    Alcohol/week: 0.0 oz  . Drug use: No  . Sexual activity: Not Currently  Other Topics Concern  . None  Social History Narrative   Lives with parents.   Regular exercise- No    Family History  Problem Relation Age of Onset  . Depression Mother   . Bipolar disorder Father   . Colon cancer Other   . Cholelithiasis Maternal Grandmother   . Diabetes Maternal Grandmother   . Prostate cancer Maternal Grandfather   . Colon polyps Maternal Grandfather   . Esophageal cancer Neg Hx   . Rectal cancer Neg Hx   . Stomach cancer Neg Hx     Review of Systems  Constitutional: Negative for fever.  HENT: Positive for sore throat and trouble swallowing (a little). Negative for congestion, ear pain and sinus pain.   Respiratory: Positive for shortness of breath. Negative for cough and wheezing.   Cardiovascular: Positive for palpitations. Negative for chest pain and leg swelling.  Neurological: Positive for light-headedness. Negative for headaches.       Objective:   Vitals:   04/03/17 1512  BP: 132/88  Pulse: (!) 122  Resp: 16  Temp: 98.1 F (36.7 C)  SpO2: 98%   Filed Weights   04/03/17 1512  Weight: 240 lb (108.9 kg)   Body mass index is 38.74 kg/m.  Wt Readings from Last 3  Encounters:  04/03/17 240 lb (108.9 kg)  10/13/16 229 lb (103.9 kg)  09/12/16 224 lb (101.6 kg)     Physical Exam GENERAL APPEARANCE: Appears stated age, well appearing, NAD EYES: conjunctiva clear, no icterus HEENT: bilateral tympanic membranes and ear canals normal, oropharynx with mild erythema, no thyromegaly, trachea midline, no cervical or supraclavicular lymphadenopathy LUNGS: Clear to auscultation without wheeze or crackles, unlabored breathing, good air entry bilaterally CARDIOVASCULAR: Tachycardia, normal  S1,S2 without murmurs, no edema SKIN: warm, dry Psych: Slightly anxious appearing        Assessment & Plan:   See Problem List for Assessment and Plan of chronic medical problems.

## 2017-04-03 NOTE — Assessment & Plan Note (Signed)
Heart rate regular on exam and appears to be in sinus rhythm EKG shows sinus tachycardia at 106, otherwise normal Heart rate has been elevated in the past, possibly anxiety related We will check basic blood work to rule out other causes including CBC, CMP and TSH He will monitor symptoms and call or return if there is no improvement

## 2017-04-30 ENCOUNTER — Other Ambulatory Visit (HOSPITAL_COMMUNITY): Payer: Self-pay

## 2017-04-30 ENCOUNTER — Other Ambulatory Visit (HOSPITAL_COMMUNITY): Payer: Self-pay | Admitting: Psychiatry

## 2017-04-30 DIAGNOSIS — F428 Other obsessive-compulsive disorder: Secondary | ICD-10-CM

## 2017-04-30 DIAGNOSIS — F422 Mixed obsessional thoughts and acts: Secondary | ICD-10-CM

## 2017-04-30 MED ORDER — MIRTAZAPINE 15 MG PO TABS: 15.0000 mg | ORAL_TABLET | Freq: Every day | ORAL | 0 refills | Status: DC

## 2017-04-30 MED ORDER — VENLAFAXINE HCL ER 75 MG PO CP24: 75.0000 mg | ORAL_CAPSULE | Freq: Every day | ORAL | 0 refills | Status: DC

## 2017-04-30 MED ORDER — ARIPIPRAZOLE 5 MG PO TABS: 5.0000 mg | ORAL_TABLET | Freq: Every day | ORAL | 0 refills | Status: DC

## 2017-05-08 ENCOUNTER — Ambulatory Visit (HOSPITAL_COMMUNITY): Payer: Self-pay | Admitting: Psychiatry

## 2017-05-09 DIAGNOSIS — B372 Candidiasis of skin and nail: Secondary | ICD-10-CM | POA: Diagnosis not present

## 2017-05-16 DIAGNOSIS — N281 Cyst of kidney, acquired: Secondary | ICD-10-CM | POA: Insufficient documentation

## 2017-05-18 DIAGNOSIS — Z8042 Family history of malignant neoplasm of prostate: Secondary | ICD-10-CM | POA: Diagnosis not present

## 2017-05-18 DIAGNOSIS — N281 Cyst of kidney, acquired: Secondary | ICD-10-CM | POA: Diagnosis not present

## 2017-05-29 ENCOUNTER — Encounter (HOSPITAL_COMMUNITY): Payer: Self-pay | Admitting: Psychiatry

## 2017-05-29 ENCOUNTER — Ambulatory Visit (INDEPENDENT_AMBULATORY_CARE_PROVIDER_SITE_OTHER): Payer: Medicare Other | Admitting: Psychiatry

## 2017-05-29 DIAGNOSIS — Z818 Family history of other mental and behavioral disorders: Secondary | ICD-10-CM | POA: Diagnosis not present

## 2017-05-29 DIAGNOSIS — F422 Mixed obsessional thoughts and acts: Secondary | ICD-10-CM

## 2017-05-29 DIAGNOSIS — Z636 Dependent relative needing care at home: Secondary | ICD-10-CM

## 2017-05-29 DIAGNOSIS — Z560 Unemployment, unspecified: Secondary | ICD-10-CM

## 2017-05-29 DIAGNOSIS — F428 Other obsessive-compulsive disorder: Secondary | ICD-10-CM | POA: Diagnosis not present

## 2017-05-29 MED ORDER — MIRTAZAPINE 15 MG PO TABS: 15.0000 mg | ORAL_TABLET | Freq: Every day | ORAL | 0 refills | Status: DC

## 2017-05-29 MED ORDER — ARIPIPRAZOLE 5 MG PO TABS: 5.0000 mg | ORAL_TABLET | Freq: Every day | ORAL | 0 refills | Status: DC

## 2017-05-29 MED ORDER — VENLAFAXINE HCL ER 75 MG PO CP24
75.0000 mg | ORAL_CAPSULE | Freq: Every day | ORAL | 0 refills | Status: DC
Start: 2017-05-29 — End: 2017-08-28

## 2017-05-29 NOTE — Progress Notes (Signed)
[facility] MD/PA/NP OP Progress Note  05/29/2017 2:58 PM Bruce Ellison  MRN:  [id number]  Chief Complaint: I am doing fine.  My OCD is under control.  HPI: Patient came for his follow-up appointment.  He is taking his medication as prescribed.  He apologized missing his last appointment.  He reported his OCD is under control.  Last week he was worried about his grandfather who fainted but luckily no major injuries and he regained his consciousness.  Patient is taking care of his grandfather who is 70 year old.  He is seen recently primary care physician for checkup and he had blood work.  His blood sugar was slightly increased but he is checking his blood sugar at home which is normal.  Patient denies drinking alcohol or using any illegal substances.  He is sleeping good.  He denies any anxiety or panic attack.  He denies any feeling of hopelessness or worthlessness.  He denies any suicidal thoughts or homicidal thought.  He lives with his mother and grandfather.  His energy level is good.  Patient has no tremors, shakes or any EPS.    Visit Diagnosis:    ICD-10-CM   1. Other obsessive-compulsive disorders F42.8 venlafaxine XR (EFFEXOR-XR) 75 MG 24 hr capsule    mirtazapine (REMERON) 15 MG tablet  2. Mixed obsessional thoughts and acts F42.2 ARIPiprazole (ABILIFY) 5 MG tablet    Past Psychiatric History: Reviewed. Patient has history of psychiatric illness at age 49. At that time he was in [place]. He has Tic, anxiety and anger. He has history of paranoia. In the past he had tried Geodon, Risperdal and Seroquel. He was seeing psychiatrist at [place]. In the past he was also to get treatment in [place]. He was seeing psychiatrist in [place] in [place]. He&amp;#39;s been taking the medication since age 74. Recently his been seeing Dr. Toy Care who had prescribed her Zoloft, Valium, Klonopin, Xanax, Prozac and Ambien. We also tried luvox however he developed more irritability.  He gained  weight since the last visit.  He admitted not watching his calorie intake or doing regular exercise.  Past Medical History:  Past Medical History:  Diagnosis Date  . Adenomatous colon polyp    tubular  . Allergic rhinitis   . Anxiety   . Depression   . GERD (gastroesophageal reflux disease)   . Glucose intolerance (impaired glucose tolerance)   . HTN (hypertension)    borderline  . Insomnia   . Obesity   . OCD (obsessive compulsive disorder)   . Personality disorder ([place])    with schizoid features  . Sleep apnea   . Tourette's    per [facility]  . Varicose veins of both lower extremities   . Vitamin D deficiency     Past Surgical History:  Procedure Laterality Date  . adnoidectomy  1992    Family Psychiatric History: Reviewed  Family History:  Family History  Problem Relation Age of Onset  . Depression Mother   . Bipolar disorder Father   . Colon cancer Other   . Cholelithiasis Maternal Grandmother   . Diabetes Maternal Grandmother   . Prostate cancer Maternal Grandfather   . Colon polyps Maternal Grandfather   . Esophageal cancer Neg Hx   . Rectal cancer Neg Hx   . Stomach cancer Neg Hx     Social History:  Social History   Socioeconomic History  . Marital status: Single    Spouse name: Not on file  . Number  of children: 0  . Years of education: Not on file  . Highest education level: Not on file  Occupational History  . Occupation: unemployed  Social Needs  . Financial resource strain: Not on file  . Food insecurity:    Worry: Not on file    Inability: Not on file  . Transportation needs:    Medical: Not on file    Non-medical: Not on file  Tobacco Use  . Smoking status: Never Smoker  . Smokeless tobacco: Never Used  Substance and Sexual Activity  . Alcohol use: No    Alcohol/week: 0.0 oz  . Drug use: No  . Sexual activity: Not Currently  Lifestyle  . Physical activity:    Days per week: Not on file    Minutes per session: Not on file  .  Stress: Not on file  Relationships  . Social connections:    Talks on phone: Not on file    Gets together: Not on file    Attends religious service: Not on file    Active member of club or organization: Not on file    Attends meetings of clubs or organizations: Not on file    Relationship status: Not on file  Other Topics Concern  . Not on file  Social History Narrative   Lives with parents.   Regular exercise- No    Allergies:  Allergies  Allergen Reactions  . Honey Bee Treatment [Bee Venom] Anaphylaxis, Hives and Rash    Pt reports he does not go into anaphylaxis with bee stings, just honey  . Doxycycline   . Morphine Other (See Comments)    Pt doesn't like side effects  . Quetiapine Other (See Comments)    restlessness  . Penicillins Hives and Rash  . Sulfa Antibiotics Hives and Rash  . Sulfamethoxazole Rash and Hives    Metabolic Disorder Labs: Recent Results (from the past 2160 hour(s))  POCT Influenza A/B     Status: None   Collection Time: 04/03/17  3:40 PM  Result Value Ref Range   Influenza A, POC Negative Negative   Influenza B, POC Negative Negative  POCT rapid strep A     Status: None   Collection Time: 04/03/17  3:41 PM  Result Value Ref Range   Rapid Strep A Screen Negative Negative  TSH     Status: None   Collection Time: 04/03/17  3:58 PM  Result Value Ref Range   TSH 1.15 0.35 - 4.50 uIU/mL  Comprehensive metabolic panel     Status: Abnormal   Collection Time: 04/03/17  3:58 PM  Result Value Ref Range   Sodium 140 135 - 145 mEq/L   Potassium 4.3 3.5 - 5.1 mEq/L   Chloride 104 96 - 112 mEq/L   CO2 29 19 - 32 mEq/L   Glucose, Bld 148 (H) 70 - 99 mg/dL   BUN 14 6 - 23 mg/dL   Creatinine, Ser 0.89 0.40 - 1.50 mg/dL   Total Bilirubin 0.4 0.2 - 1.2 mg/dL   Alkaline Phosphatase 64 39 - 117 U/L   AST 24 0 - 37 U/L   ALT 42 0 - 53 U/L   Total Protein 7.4 6.0 - 8.3 g/dL   Albumin 4.4 3.5 - 5.2 g/dL   Calcium 9.4 8.4 - 10.5 mg/dL   GFR 102.44  >60.00 mL/min  CBC with Differential/Platelet     Status: Abnormal   Collection Time: 04/03/17  3:58 PM  Result Value Ref Range  WBC 5.9 4.0 - 10.5 K/uL   RBC 5.57 4.22 - 5.81 Mil/uL   Hemoglobin 16.9 13.0 - 17.0 g/dL   HCT 49.0 39.0 - 52.0 %   MCV 88.1 78.0 - 100.0 fl   MCHC 34.5 30.0 - 36.0 g/dL   RDW 13.6 11.5 - 15.5 %   Platelets 262.0 150.0 - 400.0 K/uL   Neutrophils Relative % 54.8 43.0 - 77.0 %   Lymphocytes Relative 27.2 12.0 - 46.0 %   Monocytes Relative 11.8 3.0 - 12.0 %   Eosinophils Relative 5.4 (H) 0.0 - 5.0 %   Basophils Relative 0.8 0.0 - 3.0 %   Neutro Abs 3.2 1.4 - 7.7 K/uL   Lymphs Abs 1.6 0.7 - 4.0 K/uL   Monocytes Absolute 0.7 0.1 - 1.0 K/uL   Eosinophils Absolute 0.3 0.0 - 0.7 K/uL   Basophils Absolute 0.0 0.0 - 0.1 K/uL   Lab Results  Component Value Date   HGBA1C 5.6 04/13/2016   No results found for: PROLACTIN Lab Results  Component Value Date   CHOL 193 03/10/2015   TRIG 104.0 03/10/2015   HDL 42.40 03/10/2015   CHOLHDL 5 03/10/2015   VLDL 20.8 03/10/2015   LDLCALC 130 (H) 03/10/2015   LDLCALC 130 (H) 05/02/2006   Lab Results  Component Value Date   TSH 1.15 04/03/2017   TSH 1.09 03/10/2015    Therapeutic Level Labs: No results found for: LITHIUM No results found for: VALPROATE No components found for:  CBMZ  Current Medications: Current Outpatient Medications  Medication Sig Dispense Refill  . ARIPiprazole (ABILIFY) 5 MG tablet Take 1 tablet (5 mg total) by mouth daily. 90 tablet 0  . desonide (DESOWEN) 0.05 % cream     . hydrOXYzine (ATARAX/VISTARIL) 50 MG tablet Take 1 tablet every 8 hours as needed for anxiety or panic attacks 90 tablet 1  . loratadine (CLARITIN) 10 MG tablet Take 10 mg by mouth daily as needed for allergies.    . mirtazapine (REMERON) 15 MG tablet Take 1 tablet (15 mg total) by mouth at bedtime. 90 tablet 0  . Multiple Vitamin (MULTIVITAMIN WITH MINERALS) TABS Take 1 tablet by mouth every morning.    .  venlafaxine XR (EFFEXOR-XR) 75 MG 24 hr capsule Take 1 capsule (75 mg total) by mouth daily with breakfast. 90 capsule 0   No current facility-administered medications for this visit.      Musculoskeletal: Strength & Muscle Tone: within normal limits Gait & Station: normal Patient leans: N/A  Psychiatric Specialty Exam: ROS  Blood pressure 114/78, pulse 82, height 5\' 6"  (1.676 m), weight 243 lb (110.2 kg), SpO2 98 %.There is no height or weight on file to calculate BMI.  General Appearance: Fairly Groomed  Eye Contact:  Good  Speech:  Slow  Volume:  Normal  Mood:  Anxious  Affect:  Appropriate  Thought Process:  Goal Directed  Orientation:  Full (Time, Place, and Person)  Thought Content: Logical   Suicidal Thoughts:  No  Homicidal Thoughts:  No  Memory:  Immediate;   Fair Recent;   Fair Remote;   Good  Judgement:  Good  Insight:  Good  Psychomotor Activity:  Normal  Concentration:  Concentration: Fair and Attention Span: Fair  Recall:  Good  Fund of Knowledge: Good  Language: Good  Akathisia:  No  Handed:  Right  AIMS (if indicated): not done  Assets:  Communication Skills Desire for Improvement Housing Resilience Social Support  ADL's:  Intact  Cognition:  WNL  Sleep:  Good   Screenings:   Assessment and Plan: Obsessive-compulsive disorder.  That he disorder NOS.  Patient doing better on his current medication.  He has no side effects.  He is not interested in counseling.  Continue Abilify 5 mg daily, Effexor 75 mg daily and Remeron 50 mg at bedtime.  Encourage healthy lifestyle and watch his calorie intake and weight loss.  Recommended to call us back if he has any question or any concern.  Patient does not need a new prescription of Vistaril.  Follow-up in 3 months   Kathlee Nations, MD 05/29/2017, 2:58 PM

## 2017-06-02 ENCOUNTER — Encounter: Payer: Self-pay | Admitting: Family Medicine

## 2017-06-02 ENCOUNTER — Ambulatory Visit (INDEPENDENT_AMBULATORY_CARE_PROVIDER_SITE_OTHER): Payer: Medicare Other | Admitting: Family Medicine

## 2017-06-02 VITALS — BP 114/60 | HR 83 | Temp 98.1°F | Wt 243.0 lb

## 2017-06-02 DIAGNOSIS — B356 Tinea cruris: Secondary | ICD-10-CM

## 2017-06-02 MED ORDER — TERBINAFINE HCL 250 MG PO TABS: 250.0000 mg | ORAL_TABLET | Freq: Every day | ORAL | 0 refills | Status: DC

## 2017-06-02 NOTE — Progress Notes (Signed)
Subjective:  Patient ID: Bruce Ellison, male    DOB: 27-Aug-1980  Age: 37 y.o. MRN: [id number]  CC: Rash (groin area, itchy, and burns.  Seen in UC 1 wk and was told it was a yeast infection.  no better)   HPI Bruce Ellison presents for evaluation of a pruritic rash in his groin area. Has been treated with diflucan x 2 pills and ketoconazole cream. These helped but rash has not cleared.   Outpatient Medications Prior to Visit  Medication Sig Dispense Refill  . ARIPiprazole (ABILIFY) 5 MG tablet Take 1 tablet (5 mg total) by mouth daily. 90 tablet 0  . desonide (DESOWEN) 0.05 % cream     . hydrOXYzine (ATARAX/VISTARIL) 50 MG tablet Take 1 tablet every 8 hours as needed for anxiety or panic attacks 90 tablet 1  . loratadine (CLARITIN) 10 MG tablet Take 10 mg by mouth daily as needed for allergies.    . mirtazapine (REMERON) 15 MG tablet Take 1 tablet (15 mg total) by mouth at bedtime. 90 tablet 0  . Multiple Vitamin (MULTIVITAMIN WITH MINERALS) TABS Take 1 tablet by mouth every morning.    . venlafaxine XR (EFFEXOR-XR) 75 MG 24 hr capsule Take 1 capsule (75 mg total) by mouth daily with breakfast. 90 capsule 0   No facility-administered medications prior to visit.     ROS Review of Systems  Constitutional: Negative for chills, fever and unexpected weight change.  HENT: Negative.   Eyes: Negative.   Respiratory: Negative.   Cardiovascular: Negative.   Gastrointestinal: Negative.   Endocrine: Negative for polyphagia and polyuria.  Genitourinary: Negative.   Musculoskeletal: Negative for myalgias.  Skin: Positive for color change and rash.  Allergic/Immunologic: Negative for immunocompromised state.  Neurological: Negative for headaches.  Hematological: Does not bruise/bleed easily.  Psychiatric/Behavioral: Negative.     Objective:  BP 114/60 (BP Location: Left Arm, Patient Position: Sitting, Cuff Size: Large)   Pulse 83   Temp 98.1 F (36.7 C) (Oral)   Wt 243 lb (110.2 kg)    SpO2 98%   BMI 39.22 kg/m   BP Readings from Last 3 Encounters:  06/02/17 114/60  04/03/17 132/88  10/13/16 112/78    Wt Readings from Last 3 Encounters:  06/02/17 243 lb (110.2 kg)  04/03/17 240 lb (108.9 kg)  10/13/16 229 lb (103.9 kg)    Physical Exam  Constitutional: He is oriented to person, place, and time. He appears well-developed and well-nourished.  HENT:  Head: Normocephalic and atraumatic.  Right Ear: External ear normal.  Left Ear: External ear normal.  Eyes: Pupils are equal, round, and reactive to light. Conjunctivae are normal. Right eye exhibits no discharge. Left eye exhibits no discharge. No scleral icterus.  Neck: Neck supple. No JVD present. No tracheal deviation present.  Pulmonary/Chest: Effort normal. No stridor.  Neurological: He is alert and oriented to person, place, and time.  Skin: Skin is warm. Rash noted. He is not diaphoretic. There is erythema.  Proximal inner thighs with patches of hyperpigmentation and fine scaling.  Psychiatric: He has a normal mood and affect. His behavior is normal.    Lab Results  Component Value Date   WBC 5.9 04/03/2017   HGB 16.9 04/03/2017   HCT 49.0 04/03/2017   PLT 262.0 04/03/2017   GLUCOSE 148 (H) 04/03/2017   CHOL 193 03/10/2015   TRIG 104.0 03/10/2015   HDL 42.40 03/10/2015   LDLDIRECT 136.9 08/30/2010   LDLCALC 130 (H) 03/10/2015  ALT 42 04/03/2017   AST 24 04/03/2017   NA 140 04/03/2017   K 4.3 04/03/2017   CL 104 04/03/2017   CREATININE 0.89 04/03/2017   BUN 14 04/03/2017   CO2 29 04/03/2017   TSH 1.15 04/03/2017   PSA 0.65 03/10/2015   HGBA1C 5.6 04/13/2016    US Abdomen Complete  Result Date: 12/16/2013 CLINICAL DATA:  37 year old male with recurrent right upper quadrant abdominal pain. Subsequent encounter. EXAM: ULTRASOUND ABDOMEN COMPLETE COMPARISON:  Thoracic spine MRI 06/04/2013. FINDINGS: Gallbladder: No gallstones or wall thickening visualized. No sonographic Murphy sign noted.  Common bile duct: Diameter: 5 mm, normal Liver: No focal lesion identified. Within normal limits in parenchymal echogenicity. No intrahepatic biliary ductal dilatation. IVC: No abnormality visualized. Pancreas: Visualized portion unremarkable. Spleen: Size and appearance within normal limits. Right Kidney: Length: 11.7 cm. Echogenicity within normal limits. No mass or hydronephrosis visualized. Left Kidney: Length: 12.1 cm. Small midpole region cortical cyst measuring up to 10 mm appears simple and unchanged. Echogenicity within normal limits. No mass or hydronephrosis visualized. Abdominal aorta: No aneurysm visualized. Other findings: None. IMPRESSION: Normal gallbladder.  Normal abdominal ultrasound. Electronically Signed   By: Lars Pinks M.D.   On: 12/16/2013 09:13    Assessment & Plan:   Bruce Ellison was seen today for rash.  Diagnoses and all orders for this visit:  Tinea cruris -     terbinafine (LAMISIL) 250 MG tablet; Take 1 tablet (250 mg total) by mouth daily for 21 days.   I am having Bruce Ellison start on terbinafine. I am also having him maintain his multivitamin with minerals, desonide, loratadine, hydrOXYzine, venlafaxine XR, mirtazapine, and ARIPiprazole.  Meds ordered this encounter  Medications  . terbinafine (LAMISIL) 250 MG tablet    Sig: Take 1 tablet (250 mg total) by mouth daily for 21 days.    Dispense:  21 tablet    Refill:  0   rtc if not improved.  Follow-up: Return in about 1 week (around 06/09/2017), or if symptoms worsen or fail to improve.  Libby Maw, MD

## 2017-06-02 NOTE — Patient Instructions (Signed)
Jock Itch Jock itch (tinea cruris) is a fungal infection of the skin in the groin area. It is sometimes called ringworm, even though it is not caused by worms. It is caused by a fungus, which is a type of germ that thrives in dark, damp places. Jock itch causes a rash and itching in the groin and upper thigh area. It usually goes away in 2-3 weeks with treatment. What are the causes? The fungus that causes jock itch may be spread by:  Touching a fungus infection elsewhere on your body-such as athlete's foot-and then touching your groin area.  Sharing towels or clothing with an infected person.  What increases the risk? Jock itch is most common in men and adolescent boys. This condition is more likely to develop from:  Being in hot, humid climates.  Wearing tight-fitting clothing or wet bathing suits for long periods of time.  Participating in sports.  Being overweight.  Having diabetes.  What are the signs or symptoms? Symptoms of jock itch may include:  A red, pink, or brown rash in the groin area. The rash may spread to the thighs, anus, and buttocks.  Dry and scaly skin on or around the rash.  Itchiness.  How is this diagnosed? Most often, a health care provider can make the diagnosis by looking at your rash. Sometimes, a scraping of the infected skin will be taken. This sample may be tested by looking at it under a microscope or by trying to grow the fungus from the sample (culture). How is this treated? Treatment for this condition may include:  Antifungal medicine to kill the fungus. This may be in various forms: ? Skin cream or ointment. ? Medicine taken by mouth.  Skin cream or ointment to reduce the itching.  Compresses or medicated powders to dry the infected skin.  Follow these instructions at home:  Take medicines only as directed by your health care provider. Apply skin creams or ointments exactly as directed.  Wear loose-fitting clothing. ? Men should  wear cotton boxer shorts. ? Women should wear cotton underwear.  Change your underwear every day to keep your groin dry.  Avoid hot baths.  Dry your groin area well after bathing. ? Use a separate towel to dry your groin area. This will help to prevent a spreading of the infection to other areas of your body.  Do not scratch the affected area.  Do not share towels with other people. Contact a health care provider if:  Your rash does not improve or it gets worse after 2 weeks of treatment.  Your rash is spreading.  Your rash returns after treatment is finished.  You have a fever.  You have redness, swelling, or pain in the area around your rash.  You have fluid, blood, or pus coming from your rash.  Your have your rash for more than 4 weeks. This information is not intended to replace advice given to you by your health care provider. Make sure you discuss any questions you have with your health care provider. Document Released: 02/10/2002 Document Revised: 07/29/2015 Document Reviewed: 12/02/2013 Elsevier Interactive Patient Education  2018 [employer]  

## 2017-06-08 ENCOUNTER — Ambulatory Visit: Payer: Medicare Other | Admitting: Internal Medicine

## 2017-06-13 ENCOUNTER — Encounter: Payer: Self-pay | Admitting: Internal Medicine

## 2017-06-13 ENCOUNTER — Ambulatory Visit (INDEPENDENT_AMBULATORY_CARE_PROVIDER_SITE_OTHER): Payer: Medicare Other | Admitting: Internal Medicine

## 2017-06-13 DIAGNOSIS — R739 Hyperglycemia, unspecified: Secondary | ICD-10-CM | POA: Diagnosis not present

## 2017-06-13 DIAGNOSIS — E739 Lactose intolerance, unspecified: Secondary | ICD-10-CM

## 2017-06-13 DIAGNOSIS — B372 Candidiasis of skin and nail: Secondary | ICD-10-CM

## 2017-06-13 DIAGNOSIS — L219 Seborrheic dermatitis, unspecified: Secondary | ICD-10-CM

## 2017-06-13 LAB — GLUCOSE, POCT (MANUAL RESULT ENTRY): POC Glucose: 130 mg/dl — AB (ref 70–99)

## 2017-06-13 MED ORDER — FLUCONAZOLE 100 MG PO TABS: ORAL_TABLET | ORAL | 1 refills | Status: DC

## 2017-06-13 MED ORDER — KETOCONAZOLE 2 % EX CREA: 1.0000 "application " | TOPICAL_CREAM | Freq: Two times a day (BID) | CUTANEOUS | 1 refills | Status: DC

## 2017-06-13 NOTE — Progress Notes (Signed)
Subjective:  Patient ID: Bruce Ellison, male    DOB: March 20, 1980  Age: 37 y.o. MRN: [id number]  CC: No chief complaint on file.   HPI Bruce Ellison presents for rash in the groin - refractory to Lamisil po (took x 2 wks), fluconazole helped (2 tablets) - was better fast, then came back Ketoconazole cream did not help    Outpatient Medications Prior to Visit  Medication Sig Dispense Refill  . ARIPiprazole (ABILIFY) 5 MG tablet Take 1 tablet (5 mg total) by mouth daily. 90 tablet 0  . desonide (DESOWEN) 0.05 % cream     . hydrOXYzine (ATARAX/VISTARIL) 50 MG tablet Take 1 tablet every 8 hours as needed for anxiety or panic attacks 90 tablet 1  . loratadine (CLARITIN) 10 MG tablet Take 10 mg by mouth daily as needed for allergies.    . mirtazapine (REMERON) 15 MG tablet Take 1 tablet (15 mg total) by mouth at bedtime. 90 tablet 0  . Multiple Vitamin (MULTIVITAMIN WITH MINERALS) TABS Take 1 tablet by mouth every morning.    . terbinafine (LAMISIL) 250 MG tablet Take 1 tablet (250 mg total) by mouth daily for 21 days. 21 tablet 0  . venlafaxine XR (EFFEXOR-XR) 75 MG 24 hr capsule Take 1 capsule (75 mg total) by mouth daily with breakfast. 90 capsule 0   No facility-administered medications prior to visit.     ROS Review of Systems  Constitutional: Negative for appetite change, fatigue and unexpected weight change.  HENT: Negative for congestion, nosebleeds, sneezing, sore throat and trouble swallowing.   Eyes: Negative for itching and visual disturbance.  Respiratory: Negative for cough.   Cardiovascular: Negative for chest pain, palpitations and leg swelling.  Gastrointestinal: Negative for abdominal distention, blood in stool, diarrhea and nausea.  Genitourinary: Negative for frequency and hematuria.  Musculoskeletal: Negative for back pain, gait problem, joint swelling and neck pain.  Skin: Positive for color change and rash.  Neurological: Negative for dizziness, tremors, speech  difficulty and weakness.  Psychiatric/Behavioral: Negative for agitation, dysphoric mood and sleep disturbance. The patient is not nervous/anxious.     Objective:  BP 122/70 (BP Location: Left Arm, Patient Position: Sitting, Cuff Size: Large)   Pulse 98   Temp 98.5 F (36.9 C) (Oral)   Ht 5\' 6"  (1.676 m)   Wt 241 lb (109.3 kg)   SpO2 96%   BMI 38.90 kg/m   BP Readings from Last 3 Encounters:  06/13/17 122/70  06/02/17 114/60  04/03/17 132/88    Wt Readings from Last 3 Encounters:  06/13/17 241 lb (109.3 kg)  06/02/17 243 lb (110.2 kg)  04/03/17 240 lb (108.9 kg)    Physical Exam  Constitutional: He is oriented to person, place, and time. He appears well-developed. No distress.  NAD  HENT:  Mouth/Throat: Oropharynx is clear and moist.  Eyes: Pupils are equal, round, and reactive to light. Conjunctivae are normal.  Neck: Normal range of motion. No JVD present. No thyromegaly present.  Cardiovascular: Normal rate, regular rhythm, normal heart sounds and intact distal pulses. Exam reveals no gallop and no friction rub.  No murmur heard. Pulmonary/Chest: Effort normal and breath sounds normal. No respiratory distress. He has no wheezes. He has no rales. He exhibits no tenderness.  Abdominal: Soft. Bowel sounds are normal. He exhibits no distension and no mass. There is no tenderness. There is no rebound and no guarding.  Musculoskeletal: Normal range of motion. He exhibits no edema or tenderness.  Lymphadenopathy:  He has no cervical adenopathy.  Neurological: He is alert and oriented to person, place, and time. He has normal reflexes. No cranial nerve deficit. He exhibits normal muscle tone. He displays a negative Romberg sign. Coordination and gait normal.  Skin: Skin is warm and dry. No rash noted. There is erythema.  Psychiatric: He has a normal mood and affect. His behavior is normal. Judgment and thought content normal.   intertrigo B - groin SD face  Lab Results    Component Value Date   WBC 5.9 04/03/2017   HGB 16.9 04/03/2017   HCT 49.0 04/03/2017   PLT 262.0 04/03/2017   GLUCOSE 148 (H) 04/03/2017   CHOL 193 03/10/2015   TRIG 104.0 03/10/2015   HDL 42.40 03/10/2015   LDLDIRECT 136.9 08/30/2010   LDLCALC 130 (H) 03/10/2015   ALT 42 04/03/2017   AST 24 04/03/2017   NA 140 04/03/2017   K 4.3 04/03/2017   CL 104 04/03/2017   CREATININE 0.89 04/03/2017   BUN 14 04/03/2017   CO2 29 04/03/2017   TSH 1.15 04/03/2017   PSA 0.65 03/10/2015   HGBA1C 5.6 04/13/2016    US Abdomen Complete  Result Date: 12/16/2013 CLINICAL DATA:  37 year old male with recurrent right upper quadrant abdominal pain. Subsequent encounter. EXAM: ULTRASOUND ABDOMEN COMPLETE COMPARISON:  Thoracic spine MRI 06/04/2013. FINDINGS: Gallbladder: No gallstones or wall thickening visualized. No sonographic Murphy sign noted. Common bile duct: Diameter: 5 mm, normal Liver: No focal lesion identified. Within normal limits in parenchymal echogenicity. No intrahepatic biliary ductal dilatation. IVC: No abnormality visualized. Pancreas: Visualized portion unremarkable. Spleen: Size and appearance within normal limits. Right Kidney: Length: 11.7 cm. Echogenicity within normal limits. No mass or hydronephrosis visualized. Left Kidney: Length: 12.1 cm. Small midpole region cortical cyst measuring up to 10 mm appears simple and unchanged. Echogenicity within normal limits. No mass or hydronephrosis visualized. Abdominal aorta: No aneurysm visualized. Other findings: None. IMPRESSION: Normal gallbladder.  Normal abdominal ultrasound. Electronically Signed   By: Lars Pinks M.D.   On: 12/16/2013 09:13    Assessment & Plan:   There are no diagnoses linked to this encounter. I am having Bruce Ellison maintain his multivitamin with minerals, desonide, loratadine, hydrOXYzine, venlafaxine XR, mirtazapine, ARIPiprazole, and terbinafine.  No orders of the defined types were placed in this  encounter.    Follow-up: No follow-ups on file.  Walker Kehr, MD

## 2017-06-13 NOTE — Assessment & Plan Note (Signed)
Ketoconazole bid

## 2017-06-13 NOTE — Patient Instructions (Addendum)
Loose 10 lbs Athletic underwear nylon       Intertrigo Intertrigo is skin irritation or inflammation (dermatitis) that occurs when folds of skin rub together. The irritation can cause a rash and make skin raw and itchy. This condition most commonly occurs in the skin folds of these areas:  Toes.  Armpits.  Groin.  Belly.  Breasts.  Buttocks.  Intertrigo is not passed from person to person (is not contagious). What are the causes? This condition is caused by heat, moisture, friction, and lack of air circulation. The condition can be made worse by:  Sweat.  Bacteria or a fungus, such as yeast.  What increases the risk? This condition is more likely to occur if you have moisture in your skin folds. It is also more likely to develop in people who:  Have diabetes.  Are overweight.  Are on bed rest.  Live in a warm and moist climate.  Wear splints, braces, or other medical devices.  Are not able to control their bowels or bladder (have incontinence).  What are the signs or symptoms? Symptoms of this condition include:  A pink or red skin rash.  Brown patches on the skin.  Raw or scaly skin.  Itchiness.  A burning feeling.  Bleeding.  Leaking fluid.  A bad smell.  How is this diagnosed? This condition is diagnosed with a medical history and physical exam. You may also have a skin swab to test for bacteria or a fungus, such as yeast. How is this treated? Treatment may include:  Cleaning and drying your skin.  An oral antibiotic medicine or antibiotic skin cream for a bacterial infection.  Antifungal cream or pills for an infection that was caused by a fungus, such as yeast.  Steroid ointment to relieve itchiness and irritation.  Follow these instructions at home:  Keep the affected area clean and dry.  Do not scratch your skin.  Stay in a cool environment as much as possible. Use an air conditioner or fan, if available.  Apply  over-the-counter and prescription medicines only as told by your health care provider.  If you were prescribed an antibiotic medicine, use it as told by your health care provider. Do not stop using the antibiotic even if your condition improves.  Keep all follow-up visits as told by your health care provider. This is important. How is this prevented?  Maintain a healthy weight.  Take care of your feet, especially if you have diabetes. Foot care includes: ? Wearing shoes that fit well. ? Keeping your feet dry. ? Wearing clean, breathable socks.  Protect the skin around your groin and buttocks, especially if you have incontinence. Skin protection includes: ? Following a regular cleaning routine. ? Using moisturizers and skin protectants. ? Changing protection pads frequently.  Do not wear tight clothes. Wear clothes that are loose and absorbent. Wear clothes that are made of cotton.  Wear a bra that gives good support, if needed.  Shower and dry yourself thoroughly after activity. Use a hair dryer on a cool setting to dry between skin folds, especially after you bathe.  If you have diabetes, keep your blood sugar under control. Contact a health care provider if:  Your symptoms do not improve with treatment.  Your symptoms get worse or they spread.  You notice increased redness and warmth.  You have a fever. This information is not intended to replace advice given to you by your health care provider. Make sure you discuss any questions you  have with your health care provider. Document Released: 02/20/2005 Document Revised: 07/29/2015 Document Reviewed: 08/24/2014 Elsevier Interactive Patient Education  2018 [employer].

## 2017-06-13 NOTE — Assessment & Plan Note (Signed)
Discussed pre-diabetes Loose wt Metformin discussed

## 2017-06-13 NOTE — Assessment & Plan Note (Signed)
Pre-DM Discussed diet/wt loss

## 2017-06-19 ENCOUNTER — Ambulatory Visit: Payer: Medicare Other | Admitting: Internal Medicine

## 2017-06-25 ENCOUNTER — Encounter: Payer: Self-pay | Admitting: Internal Medicine

## 2017-06-29 ENCOUNTER — Encounter: Payer: Self-pay | Admitting: Internal Medicine

## 2017-06-29 ENCOUNTER — Ambulatory Visit: Payer: Medicare Other | Admitting: Internal Medicine

## 2017-06-29 ENCOUNTER — Ambulatory Visit (INDEPENDENT_AMBULATORY_CARE_PROVIDER_SITE_OTHER): Payer: Medicare Other | Admitting: Internal Medicine

## 2017-06-29 DIAGNOSIS — B372 Candidiasis of skin and nail: Secondary | ICD-10-CM

## 2017-06-29 DIAGNOSIS — H6123 Impacted cerumen, bilateral: Secondary | ICD-10-CM

## 2017-06-29 DIAGNOSIS — R21 Rash and other nonspecific skin eruption: Secondary | ICD-10-CM | POA: Diagnosis not present

## 2017-06-29 MED ORDER — ERYTHROMYCIN STEARATE 250 MG PO TABS: 250.0000 mg | ORAL_TABLET | Freq: Four times a day (QID) | ORAL | 0 refills | Status: DC

## 2017-06-29 NOTE — Assessment & Plan Note (Signed)
Failed antifungal Rx  Possible erythrasma - po erythromycin Derm ref if not better

## 2017-06-29 NOTE — Assessment & Plan Note (Signed)
Possible erythrasma - po erythromycin

## 2017-06-29 NOTE — Progress Notes (Signed)
Subjective:  Patient ID: Bruce Ellison, male    DOB: [birth year]  Age: 37 y.o. MRN: [id number]  CC: No chief complaint on file.   HPI Bruce Ellison presents for groin rash - not better at all  Outpatient Medications Prior to Visit  Medication Sig Dispense Refill  . ARIPiprazole (ABILIFY) 5 MG tablet Take 1 tablet (5 mg total) by mouth daily. 90 tablet 0  . desonide (DESOWEN) 0.05 % cream     . fluconazole (DIFLUCAN) 100 MG tablet Take 2 tabs on day#1, then 1 tab daily on Days #2-14 15 tablet 1  . hydrOXYzine (ATARAX/VISTARIL) 50 MG tablet Take 1 tablet every 8 hours as needed for anxiety or panic attacks 90 tablet 1  . ketoconazole (NIZORAL) 2 % cream Apply 1 application topically 2 (two) times daily. 45 g 1  . loratadine (CLARITIN) 10 MG tablet Take 10 mg by mouth daily as needed for allergies.    . mirtazapine (REMERON) 15 MG tablet Take 1 tablet (15 mg total) by mouth at bedtime. 90 tablet 0  . Multiple Vitamin (MULTIVITAMIN WITH MINERALS) TABS Take 1 tablet by mouth every morning.    . venlafaxine XR (EFFEXOR-XR) 75 MG 24 hr capsule Take 1 capsule (75 mg total) by mouth daily with breakfast. 90 capsule 0   No facility-administered medications prior to visit.     ROS Review of Systems  Constitutional: Negative for unexpected weight change.  HENT: Positive for ear discharge.   Skin: Positive for rash.    Objective:  BP 122/78 (BP Location: Left Arm, Patient Position: Sitting, Cuff Size: Large)   Pulse 94   Temp 98.8 F (37.1 C) (Oral)   Ht 5\' 6"  (1.676 m)   Wt 237 lb (107.5 kg)   SpO2 98%   BMI 38.25 kg/m   BP Readings from Last 3 Encounters:  06/29/17 122/78  06/13/17 122/70  06/02/17 114/60    Wt Readings from Last 3 Encounters:  06/29/17 237 lb (107.5 kg)  06/13/17 241 lb (109.3 kg)  06/02/17 243 lb (110.2 kg)    Physical Exam  Constitutional: He appears well-developed.  Skin: Skin is dry. Rash noted. There is erythema.   Groin rash; UV light - ?red B  wax    Procedure Note :     Procedure :  Ear irrigation   Indication:  Cerumen impaction   Risks, including pain, dizziness, eardrum perforation, bleeding, infection and others as well as benefits were explained to the patient in detail. Verbal consent was obtained and the patient agreed to proceed.    We used "The Elephant Ear Irrigation Device" filled with lukewarm water for irrigation. A large amount wax was recovered. Procedure has also required manual wax removal with an ear wax curette and ear forceps.   Tolerated well. Complications: None.   Postprocedure instructions :  Call if problems.   Lab Results  Component Value Date   WBC 5.9 04/03/2017   HGB 16.9 04/03/2017   HCT 49.0 04/03/2017   PLT 262.0 04/03/2017   GLUCOSE 148 (H) 04/03/2017   CHOL 193 03/10/2015   TRIG 104.0 03/10/2015   HDL 42.40 03/10/2015   LDLDIRECT 136.9 08/30/2010   LDLCALC 130 (H) 03/10/2015   ALT 42 04/03/2017   AST 24 04/03/2017   NA 140 04/03/2017   K 4.3 04/03/2017   CL 104 04/03/2017   CREATININE 0.89 04/03/2017   BUN 14 04/03/2017   CO2 29 04/03/2017   TSH 1.15 04/03/2017  PSA 0.65 03/10/2015   HGBA1C 5.6 04/13/2016    US Abdomen Complete  Result Date: 12/16/2013 CLINICAL DATA:  37 year old male with recurrent right upper quadrant abdominal pain. Subsequent encounter. EXAM: ULTRASOUND ABDOMEN COMPLETE COMPARISON:  Thoracic spine MRI 06/04/2013. FINDINGS: Gallbladder: No gallstones or wall thickening visualized. No sonographic Murphy sign noted. Common bile duct: Diameter: 5 mm, normal Liver: No focal lesion identified. Within normal limits in parenchymal echogenicity. No intrahepatic biliary ductal dilatation. IVC: No abnormality visualized. Pancreas: Visualized portion unremarkable. Spleen: Size and appearance within normal limits. Right Kidney: Length: 11.7 cm. Echogenicity within normal limits. No mass or hydronephrosis visualized. Left Kidney: Length: 12.1 cm. Small midpole region  cortical cyst measuring up to 10 mm appears simple and unchanged. Echogenicity within normal limits. No mass or hydronephrosis visualized. Abdominal aorta: No aneurysm visualized. Other findings: None. IMPRESSION: Normal gallbladder.  Normal abdominal ultrasound. Electronically Signed   By: Lars Pinks M.D.   On: 12/16/2013 09:13    Assessment & Plan:   There are no diagnoses linked to this encounter. I am having Bruce Ellison maintain his multivitamin with minerals, desonide, loratadine, hydrOXYzine, venlafaxine XR, mirtazapine, ARIPiprazole, ketoconazole, and fluconazole.  No orders of the defined types were placed in this encounter.    Follow-up: No follow-ups on file.  Walker Kehr, MD

## 2017-06-29 NOTE — Assessment & Plan Note (Signed)
See procedure 

## 2017-06-30 ENCOUNTER — Emergency Department (HOSPITAL_COMMUNITY)
Admission: EM | Admit: 2017-06-30 | Discharge: 2017-06-30 | Disposition: A | Discharge status: Home / Self Care | Payer: Medicare Other | Attending: Emergency Medicine | Admitting: Emergency Medicine

## 2017-06-30 ENCOUNTER — Encounter (HOSPITAL_COMMUNITY): Payer: Self-pay | Admitting: Emergency Medicine

## 2017-06-30 ENCOUNTER — Other Ambulatory Visit: Payer: Self-pay

## 2017-06-30 DIAGNOSIS — R21 Rash and other nonspecific skin eruption: Secondary | ICD-10-CM | POA: Diagnosis not present

## 2017-06-30 DIAGNOSIS — I10 Essential (primary) hypertension: Secondary | ICD-10-CM | POA: Diagnosis not present

## 2017-06-30 DIAGNOSIS — R339 Retention of urine, unspecified: Secondary | ICD-10-CM | POA: Insufficient documentation

## 2017-06-30 DIAGNOSIS — Z79899 Other long term (current) drug therapy: Secondary | ICD-10-CM | POA: Insufficient documentation

## 2017-06-30 LAB — URINALYSIS, ROUTINE W REFLEX MICROSCOPIC
Bilirubin Urine: NEGATIVE
Glucose, UA: NEGATIVE mg/dL
Hgb urine dipstick: NEGATIVE
Ketones, ur: NEGATIVE mg/dL
LEUKOCYTES UA: NEGATIVE
Nitrite: NEGATIVE
PROTEIN: NEGATIVE mg/dL
Specific Gravity, Urine: 1.025 (ref 1.005–1.030)
pH: 5 (ref 5.0–8.0)

## 2017-06-30 MED ORDER — CLOTRIMAZOLE 1 % EX CREA: TOPICAL_CREAM | CUTANEOUS | 0 refills | Status: DC

## 2017-06-30 NOTE — ED Triage Notes (Signed)
Patient is complaining of trouble with urinating. Patient also states that he has a rash that is on his groin. Patient groin on both side is reddened.

## 2017-06-30 NOTE — ED Provider Notes (Signed)
[facility] DEPT Provider Note   CSN: [id number] Arrival date & time: 06/30/17  8546     History   Chief Complaint Chief Complaint  Patient presents with  . Urinary Retention  . Rash    HPI Bruce Ellison is a 37 y.o. male.  38 year old male presents with 35-month history of groin rash.  Has been treating it with oral as well as topical antifungals.  No fever or chills.  Subjective urinary retention but no dysuria or hematuria.  No other rashes noted.  Nothing makes his symptoms better.     Past Medical History:  Diagnosis Date  . Adenomatous colon polyp    tubular  . Allergic rhinitis   . Anxiety   . Depression   . GERD (gastroesophageal reflux disease)   . Glucose intolerance (impaired glucose tolerance)   . HTN (hypertension)    borderline  . Insomnia   . Obesity   . OCD (obsessive compulsive disorder)   . Personality disorder ([place])    with schizoid features  . Sleep apnea   . Tourette's    per [facility]  . Varicose veins of both lower extremities   . Vitamin D deficiency     Patient Active Problem List   Diagnosis Date Noted  . Rash of groin 06/29/2017  . Intertriginous candidiasis 06/13/2017  . Family history of malignant neoplasm of prostate 05/18/2017  . Renal cyst 05/16/2017  . Tachycardia 04/03/2017  . Varicella vaccination 10/13/2016  . Spider veins of both lower extremities 09/12/2016  . Acute pharyngitis 08/19/2015  . Hyperlipidemia 03/10/2015  . Nonallopathic lesion-rib cage 12/31/2013  . Nonallopathic lesion of lumbosacral region 12/31/2013  . Sleep difficulties 12/28/2013  . Diarrhea 10/10/2013  . Cervical disc disorder with radiculopathy of cervical region 06/11/2013  . Acute thoracic back pain 05/28/2013  . Muscle spasm of back 03/24/2013  . Nonallopathic lesion of thoracic region 03/24/2013  . Finger wound, simple, open 12/08/2012  . Abdominal distention 12/06/2012  . Sinusitis nasal 06/04/2012  .  Hyperglycemia 06/04/2012  . Weight gain 06/04/2012  . Severe episode of recurrent major depressive disorder ([place]) 05/28/2012  . Generalized anxiety disorder 05/28/2012  . Varicose veins of lower extremities with other complications 27/05/5007  . Sore throat 02/16/2012  . OSA (obstructive sleep apnea) 02/14/2011  . Polycythemia 12/14/2010  . Cerumen impaction 06/24/2010  . VITAMIN D DEFICIENCY 12/25/2008  . SEBORRHEIC DERMATITIS 07/23/2008  . FREQUENCY, URINARY 07/23/2008  . CELLULITIS/ABSCESS, FOOT 09/11/2007  . GLUCOSE INTOLERANCE 04/11/2007  . Allergic rhinitis 04/11/2007  . GERD 04/11/2007  . Backache 04/11/2007    Past Surgical History:  Procedure Laterality Date  . adnoidectomy  1992        Home Medications    Prior to Admission medications   Medication Sig Start Date End Date Taking? Authorizing Provider  ARIPiprazole (ABILIFY) 5 MG tablet Take 1 tablet (5 mg total) by mouth daily. 05/29/17   Kathlee Nations, MD  desonide (DESOWEN) 0.05 % cream  03/29/13   [provider]  erythromycin (ERYTHROCIN STEARATE) 250 MG tablet Take 1 tablet (250 mg total) by mouth 4 (four) times daily. 06/29/17   Plotnikov, Evie Lacks, MD  hydrOXYzine (ATARAX/VISTARIL) 50 MG tablet Take 1 tablet every 8 hours as needed for anxiety or panic attacks 08/30/16   Daron Offer, Richard Miu, MD  loratadine (CLARITIN) 10 MG tablet Take 10 mg by mouth daily as needed for allergies.    [provider]  mirtazapine (REMERON) 15  MG tablet Take 1 tablet (15 mg total) by mouth at bedtime. 05/29/17   [name], Arlyce Harman, MD  Multiple Vitamin (MULTIVITAMIN WITH MINERALS) TABS Take 1 tablet by mouth every morning.    [provider]  venlafaxine XR (EFFEXOR-XR) 75 MG 24 hr capsule Take 1 capsule (75 mg total) by mouth daily with breakfast. 05/29/17   [name], Arlyce [place], MD    Family History Family History  Problem Relation Age of Onset  . Depression Mother   . Bipolar disorder Father   . Colon  cancer Other   . Cholelithiasis Maternal Grandmother   . Diabetes Maternal Grandmother   . Prostate cancer Maternal Grandfather   . Colon polyps Maternal Grandfather   . Esophageal cancer Neg Hx   . Rectal cancer Neg Hx   . Stomach cancer Neg Hx     Social History Social History   Tobacco Use  . Smoking status: Never Smoker  . Smokeless tobacco: Never Used  Substance Use Topics  . Alcohol use: No    Alcohol/week: 0.0 oz  . Drug use: No     Allergies   Honey bee treatment [bee venom]; Doxycycline; Morphine; Quetiapine; Penicillins; Sulfa antibiotics; and Sulfamethoxazole   Review of Systems Review of Systems  All other systems reviewed and are negative.    Physical Exam Updated Vital Signs BP 123/82 (BP Location: Left Arm)   Pulse 87   Temp 98.3 F (36.8 C) (Oral)   Resp 18   Ht 1.676 m (5\' 6" )   Wt 106.6 kg (235 lb)   SpO2 97%   BMI 37.93 kg/m   Physical Exam  Constitutional: He is oriented to person, place, and time. He appears well-developed and well-nourished.  Non-toxic appearance. No distress.  HENT:  Head: Normocephalic and atraumatic.  Eyes: Pupils are equal, round, and reactive to light. Conjunctivae, EOM and lids are normal.  Neck: Normal range of motion. Neck supple. No tracheal deviation present. No thyroid mass present.  Cardiovascular: Normal rate, regular rhythm and normal heart sounds. Exam reveals no gallop.  No murmur heard. Pulmonary/Chest: Effort normal and breath sounds normal. No stridor. No respiratory distress. He has no decreased breath sounds. He has no wheezes. He has no rhonchi. He has no rales.  Abdominal: Soft. Normal appearance and bowel sounds are normal. He exhibits no distension. There is no tenderness. There is no rebound and no CVA tenderness.  Genitourinary:     Musculoskeletal: Normal range of motion. He exhibits no edema or tenderness.  Neurological: He is alert and oriented to person, place, and time. He has normal  strength. No cranial nerve deficit or sensory deficit. GCS eye subscore is 4. GCS verbal subscore is 5. GCS motor subscore is 6.  Skin: Skin is warm and dry. No abrasion and no rash noted.  Psychiatric: He has a normal mood and affect. His speech is normal and behavior is normal.  Nursing note and vitals reviewed.    ED Treatments / Results  Labs (all labs ordered are listed, but only abnormal results are displayed) Labs Reviewed  URINE CULTURE  URINALYSIS, ROUTINE W REFLEX MICROSCOPIC    EKG None  Radiology No results found.  Procedures Procedures (including critical care time)  Medications Ordered in ED Medications - No data to display   Initial Impression / Assessment and Plan / ED Course  I have reviewed the triage vital signs and the nursing notes.  Pertinent labs & imaging results that were available during my care of  the patient were reviewed by me and considered in my medical decision making (see chart for details).     Patient instructed to use topical antifungals with a dermatologist of his choice.  His urinalysis showed no signs of infection.  No suprapubic tenderness or fullness noted.  Patient states he is able to urinate appropriate this time.  Final Clinical Impressions(s) / ED Diagnoses   Final diagnoses:  None    ED Discharge Orders    None       Lacretia Leigh, MD 06/30/17 [phone]

## 2017-07-01 ENCOUNTER — Encounter: Payer: Self-pay | Admitting: Internal Medicine

## 2017-07-01 LAB — URINE CULTURE
Culture: NO GROWTH
SPECIAL REQUESTS: NORMAL

## 2017-07-02 ENCOUNTER — Other Ambulatory Visit: Payer: Self-pay | Admitting: Internal Medicine

## 2017-07-02 DIAGNOSIS — R21 Rash and other nonspecific skin eruption: Secondary | ICD-10-CM

## 2017-07-02 MED ORDER — CLOTRIMAZOLE 1 % EX CREA: TOPICAL_CREAM | CUTANEOUS | 1 refills | Status: DC

## 2017-07-13 DIAGNOSIS — L304 Erythema intertrigo: Secondary | ICD-10-CM | POA: Diagnosis not present

## 2017-07-13 DIAGNOSIS — L218 Other seborrheic dermatitis: Secondary | ICD-10-CM | POA: Diagnosis not present

## 2017-08-27 DIAGNOSIS — H0100A Unspecified blepharitis right eye, upper and lower eyelids: Secondary | ICD-10-CM | POA: Diagnosis not present

## 2017-08-27 DIAGNOSIS — H0100B Unspecified blepharitis left eye, upper and lower eyelids: Secondary | ICD-10-CM | POA: Diagnosis not present

## 2017-08-27 DIAGNOSIS — H40053 Ocular hypertension, bilateral: Secondary | ICD-10-CM | POA: Diagnosis not present

## 2017-08-28 ENCOUNTER — Encounter (HOSPITAL_COMMUNITY): Payer: Self-pay | Admitting: Psychiatry

## 2017-08-28 ENCOUNTER — Ambulatory Visit (INDEPENDENT_AMBULATORY_CARE_PROVIDER_SITE_OTHER): Payer: Medicare Other | Admitting: Psychiatry

## 2017-08-28 VITALS — BP 120/78 | HR 84 | Ht 66.0 in | Wt 230.0 lb

## 2017-08-28 DIAGNOSIS — Z818 Family history of other mental and behavioral disorders: Secondary | ICD-10-CM | POA: Diagnosis not present

## 2017-08-28 DIAGNOSIS — F411 Generalized anxiety disorder: Secondary | ICD-10-CM | POA: Diagnosis not present

## 2017-08-28 DIAGNOSIS — Z79899 Other long term (current) drug therapy: Secondary | ICD-10-CM | POA: Diagnosis not present

## 2017-08-28 DIAGNOSIS — F429 Obsessive-compulsive disorder, unspecified: Secondary | ICD-10-CM

## 2017-08-28 MED ORDER — MIRTAZAPINE 15 MG PO TABS: 15.0000 mg | ORAL_TABLET | Freq: Every day | ORAL | 0 refills | Status: DC

## 2017-08-28 MED ORDER — VENLAFAXINE HCL ER 75 MG PO CP24: 75.0000 mg | ORAL_CAPSULE | Freq: Every day | ORAL | 0 refills | Status: DC

## 2017-08-28 MED ORDER — ARIPIPRAZOLE 5 MG PO TABS: 5.0000 mg | ORAL_TABLET | Freq: Every day | ORAL | 0 refills | Status: DC

## 2017-08-28 NOTE — Progress Notes (Signed)
BH MD/PA/NP OP Progress Note  08/28/2017 2:57 PM Bruce Ellison  MRN:  [id number]  Chief Complaint: I am taking my medication.  I am sleeping good.  My OCD is under control.  HPI: Patient came for his follow-up appointment.  He is compliant with Abilify, Remeron and Effexor.  He reported his OCD and anxiety is under control.  Sometime he has a nightmare which could be due to Remeron but it does not bother him.  He is taking care of his 37 year old grandfather.  Patient denies any major panic attack.  He denies any feeling of hopelessness or worthlessness.  He is trying to lose weight and watching his calorie intake.  He lost 10 pounds since the last visit.  Few weeks ago he was seen in the emergency room because of groin pain and he was given antifungal which he took.  He has no tremors, shakes or any EPS.  He denies drinking or using any illegal substances.  Energy level is good.  He has no tremors, shakes or any EPS.  Visit Diagnosis:    ICD-10-CM   1. Obsessive-compulsive disorder, unspecified type F42.9 mirtazapine (REMERON) 15 MG tablet    ARIPiprazole (ABILIFY) 5 MG tablet  2. GAD (generalized anxiety disorder) F41.1 mirtazapine (REMERON) 15 MG tablet    venlafaxine XR (EFFEXOR-XR) 75 MG 24 hr capsule    Past Psychiatric History: Reviewed Patient has history of psychiatric illness at age 67. At that time he was in [place]. He has Tic, anxiety and anger. He has history of paranoia. In the past he had tried Geodon, Risperdal and Seroquel. He was seeing psychiatrist at [facility]. In the past he was also to get treatment in [place]. He was seeing psychiatrist in [place] in [place]. He&amp;#39;s been taking the medication since age 54. Recently his been seeing Dr. Toy Care who had prescribed her Zoloft, Valium, Klonopin, Xanax, Prozac and Ambien. We also tried luvox however he developed more irritability.    Past Medical History:  Past Medical History:  Diagnosis Date  .  Adenomatous colon polyp    tubular  . Allergic rhinitis   . Anxiety   . Depression   . GERD (gastroesophageal reflux disease)   . Glucose intolerance (impaired glucose tolerance)   . HTN (hypertension)    borderline  . Insomnia   . Obesity   . OCD (obsessive compulsive disorder)   . Personality disorder ([place])    with schizoid features  . Sleep apnea   . Tourette's    per [facility]  . Varicose veins of both lower extremities   . Vitamin D deficiency     Past Surgical History:  Procedure Laterality Date  . adnoidectomy  1992    Family Psychiatric History: Reviewed.  Family History:  Family History  Problem Relation Age of Onset  . Depression Mother   . Bipolar disorder Father   . Colon cancer Other   . Cholelithiasis Maternal Grandmother   . Diabetes Maternal Grandmother   . Prostate cancer Maternal Grandfather   . Colon polyps Maternal Grandfather   . Esophageal cancer Neg Hx   . Rectal cancer Neg Hx   . Stomach cancer Neg Hx     Social History:  Social History   Socioeconomic History  . Marital status: Single    Spouse name: Not on file  . Number of children: 0  . Years of education: Not on file  . Highest education level: Not on file  Occupational  History  . Occupation: unemployed  Social Needs  . Financial resource strain: Not on file  . Food insecurity:    Worry: Not on file    Inability: Not on file  . Transportation needs:    Medical: Not on file    Non-medical: Not on file  Tobacco Use  . Smoking status: Never Smoker  . Smokeless tobacco: Never Used  Substance and Sexual Activity  . Alcohol use: No    Alcohol/week: 0.0 oz  . Drug use: No  . Sexual activity: Not Currently  Lifestyle  . Physical activity:    Days per week: Not on file    Minutes per session: Not on file  . Stress: Not on file  Relationships  . Social connections:    Talks on phone: Not on file    Gets together: Not on file    Attends religious service: Not on file     Active member of club or organization: Not on file    Attends meetings of clubs or organizations: Not on file    Relationship status: Not on file  Other Topics Concern  . Not on file  Social History Narrative   Lives with parents.   Regular exercise- No    Allergies:  Allergies  Allergen Reactions  . Honey Bee Treatment [Bee Venom] Anaphylaxis, Hives and Rash    Pt reports he does not go into anaphylaxis with bee stings, just honey  . Doxycycline   . Morphine Other (See Comments)    Pt doesn't like side effects  . Quetiapine Other (See Comments)    restlessness  . Penicillins Hives and Rash  . Sulfa Antibiotics Hives and Rash  . Sulfamethoxazole Rash and Hives    Metabolic Disorder Labs: Recent Results (from the past 2160 hour(s))  POCT Glucose (CBG)     Status: Abnormal   Collection Time: 06/13/17  3:12 PM  Result Value Ref Range   POC Glucose 130 (A) 70 - 99 mg/dl  Urinalysis, Routine w reflex microscopic- may I&O cath if menses     Status: None   Collection Time: 06/30/17  6:32 AM  Result Value Ref Range   Color, Urine YELLOW YELLOW   APPearance CLEAR CLEAR   Specific Gravity, Urine 1.025 1.005 - 1.030   pH 5.0 5.0 - 8.0   Glucose, UA NEGATIVE NEGATIVE mg/dL   Hgb urine dipstick NEGATIVE NEGATIVE   Bilirubin Urine NEGATIVE NEGATIVE   Ketones, ur NEGATIVE NEGATIVE mg/dL   Protein, ur NEGATIVE NEGATIVE mg/dL   Nitrite NEGATIVE NEGATIVE   Leukocytes, UA NEGATIVE NEGATIVE    Comment: Performed at [facility] [address], [place], [place] [address]  Urine C&S     Status: None   Collection Time: 06/30/17  6:32 AM  Result Value Ref Range   Specimen Description      URINE, CLEAN CATCH Performed at [facility], [place] [address], [place], [place] [address]    Special Requests      Normal Performed at [facility], [place] [address] [place], [place] [address]    Culture      NO GROWTH Performed at [facility], [place] [address] [place], [place] [address]    Report Status 07/01/2017 FINAL    Lab Results  Component Value Date   HGBA1C 5.6 04/13/2016   No results found for: PROLACTIN Lab Results  Component Value Date   CHOL 193 03/10/2015   TRIG 104.0 03/10/2015  HDL 42.40 03/10/2015   CHOLHDL 5 03/10/2015   VLDL 20.8 03/10/2015   LDLCALC 130 (H) 03/10/2015   LDLCALC 130 (H) 05/02/2006   Lab Results  Component Value Date   TSH 1.15 04/03/2017   TSH 1.09 03/10/2015    Therapeutic Level Labs: No results found for: LITHIUM No results found for: VALPROATE No components found for:  CBMZ  Current Medications: Current Outpatient Medications  Medication Sig Dispense Refill  . ARIPiprazole (ABILIFY) 5 MG tablet Take 1 tablet (5 mg total) by mouth daily. 90 tablet 0  . clotrimazole (LOTRIMIN) 1 % cream Apply to affected area 2 times daily 60 g 1  . desonide (DESOWEN) 0.05 % cream     . hydrOXYzine (ATARAX/VISTARIL) 50 MG tablet Take 1 tablet every 8 hours as needed for anxiety or panic attacks 90 tablet 1  . loratadine (CLARITIN) 10 MG tablet Take 10 mg by mouth daily as needed for allergies.    . mirtazapine (REMERON) 15 MG tablet Take 1 tablet (15 mg total) by mouth at bedtime. 90 tablet 0  . Multiple Vitamin (MULTIVITAMIN WITH MINERALS) TABS Take 1 tablet by mouth every morning.    . venlafaxine XR (EFFEXOR-XR) 75 MG 24 hr capsule Take 1 capsule (75 mg total) by mouth daily with breakfast. 90 capsule 0  . erythromycin (ERYTHROCIN STEARATE) 250 MG tablet Take 1 tablet (250 mg total) by mouth 4 (four) times daily. (Patient not taking: Reported on 08/28/2017) 56 tablet 0   No current facility-administered medications for this visit.      Musculoskeletal: Strength & Muscle Tone: within normal limits Gait & Station: normal Patient leans: N/A  Psychiatric Specialty Exam: ROS  Blood pressure 120/78, pulse 84, height 5\' 6"  (1.676 m), weight 230 lb (104.3 kg), SpO2 98 %.Body mass  index is 37.12 kg/m.  General Appearance: Fairly Groomed  Eye Contact:  Fair  Speech:  Clear and Coherent  Volume:  Normal  Mood:  Anxious  Affect:  Congruent  Thought Process:  Goal Directed  Orientation:  Full (Time, Place, and Person)  Thought Content: Rumination   Suicidal Thoughts:  No  Homicidal Thoughts:  No  Memory:  Immediate;   Good Recent;   Good Remote;   Good  Judgement:  Good  Insight:  Good  Psychomotor Activity:  Normal  Concentration:  Concentration: Fair and Attention Span: Fair  Recall:  Good  Fund of Knowledge: Good  Language: Good  Akathisia:  No  Handed:  Right  AIMS (if indicated): not done  Assets:  Communication Skills Desire for Improvement Housing Resilience Social Support  ADL's:  Intact  Cognition: WNL  Sleep:  Good   Screenings:   Assessment and Plan: Obsessive-compulsive disorder.  Generalized anxiety disorder.  Patient is a stable on his current medication.  I reviewed blood work results however he had no hemoglobin A1c in a while.  He is taking Abilify 5 mg and I discussed medication side effect especially metabolic syndrome.  We will do hemoglobin A1c today.  Continue Abilify 5 mg daily, Remeron 30 mg at bedtime and Effexor XR 75 mg daily.  Patient is not interested in counseling.  Recommended to call us back if he has any question or any concern.  Sometime he takes Vistaril when he feels very nervous and anxious and he does not need a new prescription at this time.  Follow-up in 3 months.   Kathlee Nations, MD 08/28/2017, 2:57 PM

## 2017-08-28 NOTE — Addendum Note (Signed)
Addended by: Lethea Killings on: 08/28/2017 03:36 PM   Modules accepted: Orders

## 2017-08-29 LAB — HEMOGLOBIN A1C
Est. average glucose Bld gHb Est-mCnc: 120 mg/dL
Hgb A1c MFr Bld: 5.8 % — ABNORMAL HIGH (ref 4.8–5.6)

## 2017-11-13 DIAGNOSIS — L304 Erythema intertrigo: Secondary | ICD-10-CM | POA: Diagnosis not present

## 2017-11-13 DIAGNOSIS — L219 Seborrheic dermatitis, unspecified: Secondary | ICD-10-CM | POA: Diagnosis not present

## 2017-11-24 ENCOUNTER — Ambulatory Visit: Payer: Self-pay | Admitting: Family Medicine

## 2017-11-26 ENCOUNTER — Encounter: Payer: Self-pay | Admitting: Internal Medicine

## 2017-11-26 ENCOUNTER — Other Ambulatory Visit (INDEPENDENT_AMBULATORY_CARE_PROVIDER_SITE_OTHER): Payer: Medicare Other

## 2017-11-26 ENCOUNTER — Ambulatory Visit (INDEPENDENT_AMBULATORY_CARE_PROVIDER_SITE_OTHER): Payer: Medicare Other | Admitting: Internal Medicine

## 2017-11-26 VITALS — BP 124/72 | HR 98 | Temp 98.5°F | Ht 66.0 in | Wt 226.0 lb

## 2017-11-26 DIAGNOSIS — R739 Hyperglycemia, unspecified: Secondary | ICD-10-CM

## 2017-11-26 DIAGNOSIS — Z23 Encounter for immunization: Secondary | ICD-10-CM

## 2017-11-26 DIAGNOSIS — R21 Rash and other nonspecific skin eruption: Secondary | ICD-10-CM | POA: Diagnosis not present

## 2017-11-26 LAB — BASIC METABOLIC PANEL
BUN: 14 mg/dL (ref 6–23)
CO2: 26 mEq/L (ref 19–32)
Calcium: 9.4 mg/dL (ref 8.4–10.5)
Chloride: 105 mEq/L (ref 96–112)
Creatinine, Ser: 0.87 mg/dL (ref 0.40–1.50)
GFR: 104.78 mL/min (ref >60.00)
Glucose, Bld: 113 mg/dL — ABNORMAL HIGH (ref 70–99)
POTASSIUM: 3.7 meq/L (ref 3.5–5.1)
SODIUM: 138 meq/L (ref 135–145)

## 2017-11-26 LAB — HEMOGLOBIN A1C: Hgb A1c MFr Bld: 5.5 % (ref 4.6–6.5)

## 2017-11-26 NOTE — Patient Instructions (Addendum)
Liquid soap Use no wash cloth or sponge

## 2017-11-26 NOTE — Progress Notes (Signed)
Subjective:  Patient ID: Bruce Ellison, male    DOB: 02-Aug-1980  Age: 37 y.o. MRN: [id number]  CC: No chief complaint on file.   HPI Bruce Ellison presents for rash on foreskin x 3 d F/u elev glucose  Outpatient Medications Prior to Visit  Medication Sig Dispense Refill  . ARIPiprazole (ABILIFY) 5 MG tablet Take 1 tablet (5 mg total) by mouth daily. 90 tablet 0  . clotrimazole (LOTRIMIN) 1 % cream Apply to affected area 2 times daily 60 g 1  . desonide (DESOWEN) 0.05 % cream     . erythromycin (ERYTHROCIN STEARATE) 250 MG tablet Take 1 tablet (250 mg total) by mouth 4 (four) times daily. 56 tablet 0  . hydrOXYzine (ATARAX/VISTARIL) 50 MG tablet Take 1 tablet every 8 hours as needed for anxiety or panic attacks 90 tablet 1  . loratadine (CLARITIN) 10 MG tablet Take 10 mg by mouth daily as needed for allergies.    . mirtazapine (REMERON) 15 MG tablet Take 1 tablet (15 mg total) by mouth at bedtime. 90 tablet 0  . Multiple Vitamin (MULTIVITAMIN WITH MINERALS) TABS Take 1 tablet by mouth every morning.    . venlafaxine XR (EFFEXOR-XR) 75 MG 24 hr capsule Take 1 capsule (75 mg total) by mouth daily with breakfast. 90 capsule 0   No facility-administered medications prior to visit.     ROS: Review of Systems  Constitutional: Negative for appetite change, fatigue and unexpected weight change.  HENT: Negative for congestion, nosebleeds, sneezing, sore throat and trouble swallowing.   Eyes: Negative for itching and visual disturbance.  Respiratory: Negative for cough.   Cardiovascular: Negative for chest pain, palpitations and leg swelling.  Gastrointestinal: Negative for abdominal distention, blood in stool, diarrhea and nausea.  Genitourinary: Negative for frequency and hematuria.  Musculoskeletal: Negative for back pain, gait problem, joint swelling and neck pain.  Skin: Positive for rash.  Neurological: Negative for dizziness, tremors, speech difficulty and weakness.    Psychiatric/Behavioral: Negative for agitation, dysphoric mood, sleep disturbance and suicidal ideas. The patient is not nervous/anxious.     Objective:  BP 124/72 (BP Location: Left Arm, Patient Position: Sitting, Cuff Size: Large)   Pulse 98   Temp 98.5 F (36.9 C) (Oral)   Ht 5\' 6"  (1.676 m)   Wt 226 lb (102.5 kg)   SpO2 97%   BMI 36.48 kg/m   BP Readings from Last 3 Encounters:  11/26/17 124/72  06/30/17 116/78  06/29/17 122/78    Wt Readings from Last 3 Encounters:  11/26/17 226 lb (102.5 kg)  06/30/17 235 lb (106.6 kg)  06/29/17 237 lb (107.5 kg)    Physical Exam  Lab Results  Component Value Date   WBC 5.9 04/03/2017   HGB 16.9 04/03/2017   HCT 49.0 04/03/2017   PLT 262.0 04/03/2017   GLUCOSE 148 (H) 04/03/2017   CHOL 193 03/10/2015   TRIG 104.0 03/10/2015   HDL 42.40 03/10/2015   LDLDIRECT 136.9 08/30/2010   LDLCALC 130 (H) 03/10/2015   ALT 42 04/03/2017   AST 24 04/03/2017   NA 140 04/03/2017   K 4.3 04/03/2017   CL 104 04/03/2017   CREATININE 0.89 04/03/2017   BUN 14 04/03/2017   CO2 29 04/03/2017   TSH 1.15 04/03/2017   PSA 0.65 03/10/2015   HGBA1C 5.8 (H) 08/28/2017    No results found.  Assessment & Plan:   There are no diagnoses linked to this encounter.   No orders of the  defined types were placed in this encounter.    Follow-up: No follow-ups on file.  Walker Kehr, MD

## 2017-11-26 NOTE — Assessment & Plan Note (Signed)
Penile rash - use same Rx as for groin. [place] is not sexually active

## 2017-11-26 NOTE — Assessment & Plan Note (Signed)
Labs

## 2017-11-28 ENCOUNTER — Ambulatory Visit (INDEPENDENT_AMBULATORY_CARE_PROVIDER_SITE_OTHER): Payer: Medicare Other | Admitting: Psychiatry

## 2017-11-28 ENCOUNTER — Encounter (HOSPITAL_COMMUNITY): Payer: Self-pay | Admitting: Psychiatry

## 2017-11-28 VITALS — BP 123/79 | HR 96 | Ht 66.0 in | Wt 226.0 lb

## 2017-11-28 DIAGNOSIS — F429 Obsessive-compulsive disorder, unspecified: Secondary | ICD-10-CM

## 2017-11-28 DIAGNOSIS — Z79899 Other long term (current) drug therapy: Secondary | ICD-10-CM

## 2017-11-28 DIAGNOSIS — F4321 Adjustment disorder with depressed mood: Secondary | ICD-10-CM

## 2017-11-28 DIAGNOSIS — F411 Generalized anxiety disorder: Secondary | ICD-10-CM | POA: Diagnosis not present

## 2017-11-28 DIAGNOSIS — G4733 Obstructive sleep apnea (adult) (pediatric): Secondary | ICD-10-CM

## 2017-11-28 MED ORDER — ARIPIPRAZOLE 5 MG PO TABS: 5.0000 mg | ORAL_TABLET | Freq: Every day | ORAL | 0 refills | Status: DC

## 2017-11-28 MED ORDER — MIRTAZAPINE 15 MG PO TABS: 15.0000 mg | ORAL_TABLET | Freq: Every day | ORAL | 0 refills | Status: DC

## 2017-11-28 MED ORDER — VENLAFAXINE HCL ER 75 MG PO CP24: 75.0000 mg | ORAL_CAPSULE | Freq: Every day | ORAL | 0 refills | Status: DC

## 2017-11-28 NOTE — Progress Notes (Signed)
[facility] MD/PA/NP OP Progress Note  11/28/2017 3:40 PM [facility]  MRN:  [id number]  Chief Complaint: I am taking my medication.  I am doing ok  HPI: Patient came for his follow-up appointment.  He is compliant with Abilify, Remeron and Effexor XR.  He states he takes all of his medication at night, as it made him tired during the day.  He has very rare use of Hydroxyzine, and tries to avoid it due to sedation.  He reported his OCD and anxiety is under control.  He is living with his mother.  His 25 year old grandfather passed away in 10/28/17.  He states that he and his mom are sad, but they are doing ok.  He has been talking with his brother and sister in [place], but he does not have friends here.  He is able to drive and go to stores without anxiety.  Patient denies any major panic attack.  He denies any crying spells or any feeling of hopelessness or worthlessness.  He is trying to lose weight and watching his calorie intake, and has lost 12 pounds.  He is enjoying going for walks.  Sleep is good: Bedtime 8-9 PM and awake at 6-6:30 AM, and feels rested when awakens for the day. He does endorse some daytime sleepiness and has diagnosed OSA, but can not tolerate CPAP.  I have shown him different mask types, but he is not interested at this time.  He denies any irritability, anger, lability or psychosis. He denies any specific phobias. He denies mania or hypomania with racing thoughts, pressured speech, hyperactivity, grandiosity, distractibility, or changes in sleep or appetite. He denies SI, HI, self harm thoughts or AVH. He is tolerating medication without other side effects.  He has no tremors, shakes or any EPS.  Patient denies using alcohol or any illegal substances. His energy level and appetite are good.  Vital signs are stable.  No new medication added.   Visit Diagnosis:    ICD-10-CM   1. GAD (generalized anxiety disorder) F41.1 mirtazapine (REMERON) 15 MG tablet    venlafaxine XR (EFFEXOR-XR) 75  MG 24 hr capsule  2. Obsessive-compulsive disorder, unspecified type F42.9 ARIPiprazole (ABILIFY) 5 MG tablet    mirtazapine (REMERON) 15 MG tablet  3. Grief reaction F43.21   4. OSA (obstructive sleep apnea) G47.33   5. Encounter for medication management [phone]     Past Psychiatric History: Reviewed Patient has history of psychiatric illness at age 24. At that time he was in [place]. He has Tic, anxiety and anger. He has history of paranoia. In the past he had tried Geodon, Risperdal and Seroquel. He was seeing psychiatrist at [facility]. In the past he was also to get treatment in [place]. He [place] [facility] [place] [facility]. He has been taking the medication since age 30. Had been seeing Dr. Toy [facility] who had prescribed her Zoloft, Valium, Klonopin, Xanax, Prozac and Ambien. We also tried luvox however he developed more irritability.    Past Medical History:  Past Medical History:  Diagnosis Date  . Adenomatous colon polyp    tubular  . Allergic rhinitis   . Anxiety   . Depression   . GERD (gastroesophageal reflux disease)   . Glucose intolerance (impaired glucose tolerance)   . HTN (hypertension)    borderline  . Insomnia   . Obesity   . OCD (obsessive compulsive disorder)   . Personality disorder ([facility])    with schizoid features  .  Sleep apnea   . Tourette's    per [facility]  . Varicose veins of both lower extremities   . Vitamin D deficiency     Past Surgical History:  Procedure Laterality Date  . adnoidectomy  1992    Family Psychiatric History: Biological father with depression and anxiety   Family History:  Family History  Problem Relation Age of Onset  . Depression Mother   . Bipolar disorder Father   . Colon cancer Other   . Cholelithiasis Maternal Grandmother   . Diabetes Maternal Grandmother   . Prostate cancer Maternal Grandfather   . Colon polyps Maternal Grandfather   . Esophageal cancer Neg Hx   . Rectal cancer  Neg Hx   . 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 He is currently on disability Lives with his mother Grandfather 94 years passed away 10/25/2017 Social History   Socioeconomic History  . Marital status: Single    Spouse name: Not on file  . Number of children: 0  . Years of education: Not on file  . Highest education level: Not on file  Occupational History  . Occupation: unemployed  Social Needs  . Financial resource strain: Not on file  . Food insecurity:    Worry: Not on file    Inability: Not on file  . Transportation needs:    Medical: Not on file    Non-medical: Not on file  Tobacco Use  . Smoking status: Never Smoker  . Smokeless tobacco: Never Used  Substance and Sexual Activity  . Alcohol use: No    Alcohol/week: 0.0 standard drinks  . Drug use: No  . Sexual activity: Not Currently  Lifestyle  . Physical activity:    Days per week: Not on file    Minutes per session: Not on file  . Stress: Not on file  Relationships  . Social connections:    Talks on phone: Not on file    Gets together: Not on file    Attends religious service: Not on file    Active member of club or organization: Not on file    Attends meetings of clubs or organizations: Not on file    Relationship status: Not on file  Other Topics Concern  . Not on file  Social History Narrative   Lives with parents.   Regular exercise- No    Allergies:  Allergies  Allergen Reactions  . Honey Bee Treatment [Bee Venom] Anaphylaxis, Hives and Rash    Pt reports he does not go into anaphylaxis with bee stings, just honey  . Doxycycline   . Morphine Other (See Comments)    Pt doesn't like side effects  . Quetiapine Other (See Comments)    restlessness  . Penicillins Hives and Rash  . Sulfa Antibiotics Hives and Rash  . Sulfamethoxazole Rash and Hives    Metabolic Disorder Labs: Recent Results (from the past 2160 hour(s))  Hemoglobin  A1c     Status: None   Collection Time: 11/26/17 11:40 AM  Result Value Ref Range   Hgb A1c MFr Bld 5.5 4.6 - 6.5 %    Comment: Glycemic Control Guidelines for People with Diabetes:Non Diabetic:  <6%Goal of Therapy: <7%Additional Action Suggested:  >8%   Basic metabolic panel     Status: Abnormal   Collection Time: 11/26/17 11:40 AM  Result Value Ref Range   Sodium 138 135 - 145 mEq/L   Potassium  3.7 3.5 - 5.1 mEq/L   Chloride 105 96 - 112 mEq/L   CO2 26 19 - 32 mEq/L   Glucose, Bld 113 (H) 70 - 99 mg/dL   BUN 14 6 - 23 mg/dL   Creatinine, Ser 0.87 0.40 - 1.50 mg/dL   Calcium 9.4 8.4 - 10.5 mg/dL   GFR 104.78 >60.00 mL/min   Lab Results  Component Value Date   HGBA1C 5.5 11/26/2017   No results found for: PROLACTIN Lab Results  Component Value Date   CHOL 193 03/10/2015   TRIG 104.0 03/10/2015   HDL 42.40 03/10/2015   CHOLHDL 5 03/10/2015   VLDL 20.8 03/10/2015   LDLCALC 130 (H) 03/10/2015   LDLCALC 130 (H) 05/02/2006   Lab Results  Component Value Date   TSH 1.15 04/03/2017   TSH 1.09 03/10/2015    Therapeutic Level Labs: No results found for: LITHIUM No results found for: VALPROATE No components found for:  CBMZ  Current Medications: Current Outpatient Medications  Medication Sig Dispense Refill  . ARIPiprazole (ABILIFY) 5 MG tablet Take 1 tablet (5 mg total) by mouth daily. 90 tablet 0  . clotrimazole (LOTRIMIN) 1 % cream Apply to affected area 2 times daily 60 g 1  . desonide (DESOWEN) 0.05 % cream     . erythromycin (ERYTHROCIN STEARATE) 250 MG tablet Take 1 tablet (250 mg total) by mouth 4 (four) times daily. 56 tablet 0  . hydrOXYzine (ATARAX/VISTARIL) 50 MG tablet Take 1 tablet every 8 hours as needed for anxiety or panic attacks 90 tablet 1  . loratadine (CLARITIN) 10 MG tablet Take 10 mg by mouth daily as needed for allergies.    . mirtazapine (REMERON) 15 MG tablet Take 1 tablet (15 mg total) by mouth at bedtime. 90 tablet 0  . Multiple Vitamin  (MULTIVITAMIN WITH MINERALS) TABS Take 1 tablet by mouth every morning.    . venlafaxine XR (EFFEXOR-XR) 75 MG 24 hr capsule Take 1 capsule (75 mg total) by mouth daily with breakfast. 90 capsule 0   No current facility-administered medications for this visit.      Musculoskeletal: Strength & Muscle Tone: within normal limits Gait & Station: normal Patient leans: N/A  Psychiatric Specialty Exam: Review of Systems  Constitutional: Negative.   Respiratory: Negative.   Cardiovascular: Negative.   Gastrointestinal: Negative.   Musculoskeletal: Negative.   Neurological: Negative.   Psychiatric/Behavioral: Negative for depression, hallucinations, memory loss, substance abuse and suicidal ideas. The patient is nervous/anxious. The patient does not have insomnia.     Blood pressure 123/79, pulse 96, height 5\' 6"  (1.676 m), weight 226 lb (102.5 kg), SpO2 99 %.Body mass index is 36.48 kg/m.  General Appearance: Fairly Groomed  Eye Contact:  Fair  Speech:  Clear and Coherent and Normal Rate  Volume:  Normal  Mood:  Anxious  Affect:  Congruent  Thought Process:  Goal Directed  Orientation:  Full (Time, Place, and Person)  Thought Content: Rumination   Suicidal Thoughts:  No  Homicidal Thoughts:  No  Memory:  Immediate;   Good Recent;   Good Remote;   Good  Judgement:  Good  Insight:  Good  Psychomotor Activity:  Normal and fidgets, with repetative movements/tapping fingers of right hand  Concentration:  Concentration: Fair and Attention Span: Fair  Recall:  Good  Fund of Knowledge: Good  Language: Good  Akathisia:  No  Handed:  Right  AIMS (if indicated): not done  Assets:  Communication Skills Desire for Improvement Housing  Resilience Social Support Transportation  ADL's:  Intact  Cognition: WNL  Sleep:  Good   Screenings:  Labs reviewed- 11/26/2017  CMP with glucose 113;  Hgb A1c 5.5  - both improved, presume with weight loss.   Assessment and Plan: Generalized  anxiety disorder. Obsessive-compulsive disorder.  Grief. OSA  Patient is a stable on his current medication.  I reviewed blood work results with glucose and hemoglobin A1c both improved.  He is aware of  Abilify 5 mg medication side effect especially metabolic syndrome, and has been actively working towards weight loss and increased activity with walking.   Continue Abilify 5 mg daily, Remeron 30 mg at bedtime and Effexor XR 75 mg daily.  Patient is not interested in counseling.  Recommended to call us back if he has any question or any concern.  Sometime he takes Vistaril when he feels very nervous and anxious and he does not need a new prescription at this time.   Time spent 25 minutes.  More than 50% of the time spent in medication education, psychoeducation, counseling and coordination of [facility].  Discuss safety plan that anytime having active suicidal thoughts or homicidal thoughts then patient need to call 911 or go to the local emergency room.    Follow-up in 3 months.   Lavella Hammock, MD 11/28/2017, 3:40 PM

## 2018-01-25 ENCOUNTER — Ambulatory Visit (INDEPENDENT_AMBULATORY_CARE_PROVIDER_SITE_OTHER): Payer: Medicare Other

## 2018-01-25 DIAGNOSIS — Z111 Encounter for screening for respiratory tuberculosis: Secondary | ICD-10-CM

## 2018-01-28 LAB — TB SKIN TEST
Induration: 0 mm
TB SKIN TEST: NEGATIVE

## 2018-02-04 ENCOUNTER — Encounter (HOSPITAL_COMMUNITY): Payer: Self-pay | Admitting: Psychiatry

## 2018-02-04 ENCOUNTER — Ambulatory Visit (INDEPENDENT_AMBULATORY_CARE_PROVIDER_SITE_OTHER): Payer: Medicare Other | Admitting: Psychiatry

## 2018-02-04 VITALS — BP 126/72 | HR 76 | Ht 66.0 in | Wt 218.0 lb

## 2018-02-04 DIAGNOSIS — F429 Obsessive-compulsive disorder, unspecified: Secondary | ICD-10-CM | POA: Diagnosis not present

## 2018-02-04 DIAGNOSIS — F411 Generalized anxiety disorder: Secondary | ICD-10-CM | POA: Diagnosis not present

## 2018-02-04 MED ORDER — MIRTAZAPINE 15 MG PO TABS: 15.0000 mg | ORAL_TABLET | Freq: Every day | ORAL | 0 refills | Status: DC

## 2018-02-04 MED ORDER — ARIPIPRAZOLE 5 MG PO TABS: 5.0000 mg | ORAL_TABLET | Freq: Every day | ORAL | 0 refills | Status: DC

## 2018-02-04 MED ORDER — VENLAFAXINE HCL ER 75 MG PO CP24: 75.0000 mg | ORAL_CAPSULE | Freq: Every day | ORAL | 0 refills | Status: DC

## 2018-02-04 NOTE — Progress Notes (Signed)
[facility] MD/PA/NP OP Progress Note  02/04/2018 3:27 PM [facility]  MRN:  [id number]  Chief Complaint: I am doing okay.  My grandpa died in 10/05/22.  HPI: Patient came for his follow-up appointment.  He was last seen by covering physician in my absence.  He endorsed that in October 05, 2022 his grandfather died due to complication.  He was 37 year old.  In the beginning he was sad and depressed but now things are better.  He lives with his mother and his stepfather lives close by.  He tried to spend most of the time to help his mother.  His anxiety and OCD is under control with the medication.  He denies any panic attack or any crying spells.  He is sleeping okay but admitted that he does not use CPAP because it does not help his sleep.  Recently he seen his primary care physician.  He has blood work.  He is pleased that his hemoglobin A1c is dropped.  He lost another 10 pounds since the last visit.  He is watching his calorie intake.  His energy level is good.  He denies any paranoia, hallucination or any suicidal thoughts.  He denies drinking or using any illegal substances.  Visit Diagnosis:    ICD-10-CM   1. GAD (generalized anxiety disorder) F41.1 venlafaxine XR (EFFEXOR-XR) 75 MG 24 hr capsule    mirtazapine (REMERON) 15 MG tablet  2. Obsessive-compulsive disorder, unspecified type F42.9 mirtazapine (REMERON) 15 MG tablet    ARIPiprazole (ABILIFY) 5 MG tablet    Past Psychiatric History: Reviewed. History of anxiety, Tic and depression since age 84.  Received treatment when he was in [place].  Tried Geodon, Risperdal, Zoloft, Valium, Klonopin, Xanax, Prozac Ambien and Seroquel.  Saw psychiatrist at [place] and then in [place] and [place].  Tried Luvox but make him more irritable.  No history of suicidal attempt. Past Medical History:  Past Medical History:  Diagnosis Date  . Adenomatous colon polyp    tubular  . Allergic rhinitis   . Anxiety   . Depression   . GERD (gastroesophageal  reflux disease)   . Glucose intolerance (impaired glucose tolerance)   . HTN (hypertension)    borderline  . Insomnia   . Obesity   . OCD (obsessive compulsive disorder)   . Personality disorder ([facility])    with schizoid features  . Sleep apnea   . Tourette's    per [facility]  . Varicose veins of both lower extremities   . Vitamin D deficiency     Past Surgical History:  Procedure Laterality Date  . adnoidectomy  1992    Family Psychiatric History: Reviewed.  Family History:  Family History  Problem Relation Age of Onset  . Depression Mother   . Bipolar disorder Father   . Colon cancer Other   . Cholelithiasis Maternal Grandmother   . Diabetes Maternal Grandmother   . Prostate cancer Maternal Grandfather   . Colon polyps Maternal Grandfather   . Esophageal cancer Neg Hx   . Rectal cancer Neg Hx   . Stomach cancer Neg Hx     Social History:  Social History   Socioeconomic History  . Marital status: Single    Spouse name: Not on file  . Number of children: 0  . Years of education: Not on file  . Highest education level: Not on file  Occupational History  . Occupation: unemployed  Social Needs  . Financial resource strain: Not on file  .  Food insecurity:    Worry: Not on file    Inability: Not on file  . Transportation needs:    Medical: Not on file    Non-medical: Not on file  Tobacco Use  . Smoking status: Never Smoker  . Smokeless tobacco: Never Used  Substance and Sexual Activity  . Alcohol use: No    Alcohol/week: 0.0 standard drinks  . Drug use: No  . Sexual activity: Not Currently  Lifestyle  . Physical activity:    Days per week: Not on file    Minutes per session: Not on file  . Stress: Not on file  Relationships  . Social connections:    Talks on phone: Not on file    Gets together: Not on file    Attends religious service: Not on file    Active member of club or organization: Not on file    Attends meetings of clubs or organizations:  Not on file    Relationship status: Not on file  Other Topics Concern  . Not on file  Social History Narrative   Lives with parents.   Regular exercise- No    Allergies:  Allergies  Allergen Reactions  . Honey Bee Treatment [Bee Venom] Anaphylaxis, Hives and Rash    Pt reports he does not go into anaphylaxis with bee stings, just honey  . Doxycycline   . Morphine Other (See Comments)    Pt doesn't like side effects  . Quetiapine Other (See Comments)    restlessness  . Penicillins Hives and Rash  . Sulfa Antibiotics Hives and Rash  . Sulfamethoxazole Rash and Hives    Metabolic Disorder Labs: Lab Results  Component Value Date   HGBA1C 5.5 11/26/2017   No results found for: PROLACTIN Lab Results  Component Value Date   CHOL 193 03/10/2015   TRIG 104.0 03/10/2015   HDL 42.40 03/10/2015   CHOLHDL 5 03/10/2015   VLDL 20.8 03/10/2015   LDLCALC 130 (H) 03/10/2015   LDLCALC 130 (H) 05/02/2006   Lab Results  Component Value Date   TSH 1.15 04/03/2017   TSH 1.09 03/10/2015    Therapeutic Level Labs: No results found for: LITHIUM No results found for: VALPROATE No components found for:  CBMZ  Current Medications: Current Outpatient Medications  Medication Sig Dispense Refill  . ARIPiprazole (ABILIFY) 5 MG tablet Take 1 tablet (5 mg total) by mouth daily. 90 tablet 0  . desonide (DESOWEN) 0.05 % cream     . mirtazapine (REMERON) 15 MG tablet Take 1 tablet (15 mg total) by mouth at bedtime. 90 tablet 0  . Multiple Vitamin (MULTIVITAMIN WITH MINERALS) TABS Take 1 tablet by mouth every morning.    . venlafaxine XR (EFFEXOR-XR) 75 MG 24 hr capsule Take 1 capsule (75 mg total) by mouth daily with breakfast. 90 capsule 0  . hydrOXYzine (ATARAX/VISTARIL) 50 MG tablet Take 1 tablet every 8 hours as needed for anxiety or panic attacks (Patient not taking: Reported on 02/04/2018) 90 tablet 1  . loratadine (CLARITIN) 10 MG tablet Take 10 mg by mouth daily as needed for allergies.      No current facility-administered medications for this visit.      Musculoskeletal: Strength & Muscle Tone: within normal limits Gait & Station: normal Patient leans: N/A  Psychiatric Specialty Exam: ROS  Blood pressure 126/72, pulse 76, height 5\' 6"  (1.676 m), weight 218 lb (98.9 kg).Body mass index is 35.19 kg/m.  General Appearance: Fairly Groomed  Eye Contact:  Fair  Speech:  Slow  Volume:  Normal  Mood:  Anxious  Affect:  Congruent  Thought Process:  Goal Directed  Orientation:  Full (Time, Place, and Person)  Thought Content: Logical   Suicidal Thoughts:  No  Homicidal Thoughts:  No  Memory:  Immediate;   Good Recent;   Good Remote;   Good  Judgement:  Good  Insight:  Good  Psychomotor Activity:  Normal  Concentration:  Concentration: Good and Attention Span: Good  Recall:  Good  Fund of Knowledge: Good  Language: Good  Akathisia:  No  Handed:  Right  AIMS (if indicated): not done  Assets:  Communication Skills Desire for Improvement Housing Resilience  ADL's:  Intact  Cognition: WNL  Sleep:  Good   Screenings:   Assessment and Plan: Generalized anxiety disorder.  Obsessive-compulsive disorder.  Patient is a stable on his current medication.  He is not interested in therapy.  He like to continue his current psychiatric medication which is helping his OCD and anxiety.  Continue Abilify 5 mg daily, Remeron 30 mg at bedtime and Effexor XR 75 mg daily.  Recommended to call us back if he has any question or any concern.  I reviewed blood work results from his primary care physician.  Follow-up in 3 months.   Kathlee Nations, MD 02/04/2018, 3:27 PM

## 2018-03-26 ENCOUNTER — Other Ambulatory Visit (INDEPENDENT_AMBULATORY_CARE_PROVIDER_SITE_OTHER): Payer: Medicare Other

## 2018-03-26 ENCOUNTER — Encounter: Payer: Self-pay | Admitting: Internal Medicine

## 2018-03-26 ENCOUNTER — Ambulatory Visit (INDEPENDENT_AMBULATORY_CARE_PROVIDER_SITE_OTHER): Payer: Medicare Other | Admitting: Internal Medicine

## 2018-03-26 DIAGNOSIS — D751 Secondary polycythemia: Secondary | ICD-10-CM

## 2018-03-26 DIAGNOSIS — R739 Hyperglycemia, unspecified: Secondary | ICD-10-CM

## 2018-03-26 DIAGNOSIS — G479 Sleep disorder, unspecified: Secondary | ICD-10-CM

## 2018-03-26 DIAGNOSIS — M5010 Cervical disc disorder with radiculopathy, unspecified cervical region: Secondary | ICD-10-CM

## 2018-03-26 DIAGNOSIS — R21 Rash and other nonspecific skin eruption: Secondary | ICD-10-CM | POA: Diagnosis not present

## 2018-03-26 DIAGNOSIS — H6123 Impacted cerumen, bilateral: Secondary | ICD-10-CM | POA: Diagnosis not present

## 2018-03-26 LAB — CBC WITH DIFFERENTIAL/PLATELET
Basophils Absolute: 0 10*3/uL (ref 0.0–0.1)
Basophils Relative: 0.7 % (ref 0.0–3.0)
Eosinophils Absolute: 0.1 10*3/uL (ref 0.0–0.7)
Eosinophils Relative: 2.6 % (ref 0.0–5.0)
HCT: 50.1 % (ref 39.0–52.0)
Hemoglobin: 17.1 g/dL — ABNORMAL HIGH (ref 13.0–17.0)
Lymphocytes Relative: 24.6 % (ref 12.0–46.0)
Lymphs Abs: 1.4 10*3/uL (ref 0.7–4.0)
MCHC: 34.1 g/dL (ref 30.0–36.0)
MCV: 89.2 fl (ref 78.0–100.0)
Monocytes Absolute: 0.6 10*3/uL (ref 0.1–1.0)
Monocytes Relative: 11.5 % (ref 3.0–12.0)
NEUTROS ABS: 3.4 10*3/uL (ref 1.4–7.7)
Neutrophils Relative %: 60.6 % (ref 43.0–77.0)
Platelets: 266 10*3/uL (ref 150.0–400.0)
RBC: 5.62 Mil/uL (ref 4.22–5.81)
RDW: 13.7 % (ref 11.5–15.5)
WBC: 5.5 10*3/uL (ref 4.0–10.5)

## 2018-03-26 LAB — BASIC METABOLIC PANEL
BUN: 15 mg/dL (ref 6–23)
CO2: 25 mEq/L (ref 19–32)
Calcium: 9.6 mg/dL (ref 8.4–10.5)
Chloride: 104 mEq/L (ref 96–112)
Creatinine, Ser: 0.91 mg/dL (ref 0.40–1.50)
GFR: 93.44 mL/min (ref >60.00)
Glucose, Bld: 107 mg/dL — ABNORMAL HIGH (ref 70–99)
Potassium: 3.9 mEq/L (ref 3.5–5.1)
Sodium: 139 mEq/L (ref 135–145)

## 2018-03-26 LAB — TSH: TSH: 1.14 u[IU]/mL (ref 0.35–4.50)

## 2018-03-26 LAB — HEMOGLOBIN A1C: Hgb A1c MFr Bld: 5.4 % (ref 4.6–6.5)

## 2018-03-26 NOTE — Assessment & Plan Note (Signed)
CBC

## 2018-03-26 NOTE — Progress Notes (Signed)
Subjective:  Patient ID: [facility], male    DOB: 1980/12/25  Age: 38 y.o. MRN: [id number]  CC: No chief complaint on file.   HPI [facility] presents for rash, obesity, OCD/anxiety Lost weight 21 lbs  Outpatient Medications Prior to Visit  Medication Sig Dispense Refill  . ARIPiprazole (ABILIFY) 5 MG tablet Take 1 tablet (5 mg total) by mouth daily. 90 tablet 0  . desonide (DESOWEN) 0.05 % cream     . hydrOXYzine (ATARAX/VISTARIL) 50 MG tablet Take 1 tablet every 8 hours as needed for anxiety or panic attacks 90 tablet 1  . loratadine (CLARITIN) 10 MG tablet Take 10 mg by mouth daily as needed for allergies.    . mirtazapine (REMERON) 15 MG tablet Take 1 tablet (15 mg total) by mouth at bedtime. 90 tablet 0  . Multiple Vitamin (MULTIVITAMIN WITH MINERALS) TABS Take 1 tablet by mouth every morning.    . venlafaxine XR (EFFEXOR-XR) 75 MG 24 hr capsule Take 1 capsule (75 mg total) by mouth daily with breakfast. 90 capsule 0   No facility-administered medications prior to visit.     ROS: Review of Systems  Constitutional: Negative for appetite change, fatigue and unexpected weight change.  HENT: Negative for congestion, nosebleeds, sneezing, sore throat and trouble swallowing.   Eyes: Negative for itching and visual disturbance.  Respiratory: Negative for cough.   Cardiovascular: Negative for chest pain, palpitations and leg swelling.  Gastrointestinal: Negative for abdominal distention, blood in stool, diarrhea and nausea.  Genitourinary: Negative for frequency and hematuria.  Musculoskeletal: Positive for neck pain. Negative for back pain, gait problem and joint swelling.  Skin: Negative for rash.  Neurological: Negative for dizziness, tremors, speech difficulty and weakness.  Psychiatric/Behavioral: Positive for dysphoric mood. Negative for agitation and sleep disturbance. The patient is nervous/anxious.     Objective:  BP 120/68 (BP Location: Left Arm, Patient Position:  Sitting, Cuff Size: Large)   Pulse 85   Temp 98.3 F (36.8 C) (Oral)   Ht 5\' 6"  (1.676 m)   Wt 214 lb (97.1 kg)   SpO2 98%   BMI 34.54 kg/m   BP Readings from Last 3 Encounters:  03/26/18 120/68  11/26/17 124/72  06/30/17 116/78    Wt Readings from Last 3 Encounters:  03/26/18 214 lb (97.1 kg)  11/26/17 226 lb (102.5 kg)  06/30/17 235 lb (106.6 kg)    Physical Exam Constitutional:      General: He is not in acute distress.    Appearance: He is well-developed.     Comments: NAD  Eyes:     Conjunctiva/sclera: Conjunctivae normal.     Pupils: Pupils are equal, round, and reactive to light.  Neck:     Musculoskeletal: Normal range of motion.     Thyroid: No thyromegaly.     Vascular: No JVD.  Cardiovascular:     Rate and Rhythm: Normal rate and regular rhythm.     Heart sounds: Normal heart sounds. No murmur. No friction rub. No gallop.   Pulmonary:     Effort: Pulmonary effort is normal. No respiratory distress.     Breath sounds: Normal breath sounds. No wheezing or rales.  Chest:     Chest wall: No tenderness.  Abdominal:     General: Bowel sounds are normal. There is no distension.     Palpations: Abdomen is soft. There is no mass.     Tenderness: There is no abdominal tenderness. There is no guarding or  rebound.  Musculoskeletal: Normal range of motion.        General: No tenderness.  Lymphadenopathy:     Cervical: No cervical adenopathy.  Skin:    General: Skin is warm and dry.     Findings: No rash.  Neurological:     Mental Status: He is alert and oriented to person, place, and time.     Cranial Nerves: No cranial nerve deficit.     Motor: No abnormal muscle tone.     Coordination: Coordination normal.     Gait: Gait normal.     Deep Tendon Reflexes: Reflexes are normal and symmetric.  Psychiatric:        Behavior: Behavior normal.        Thought Content: Thought content normal.        Judgment: Judgment normal.   wax B   Procedure Note :      Procedure :  Ear irrigation right and left ears   Indication:  Cerumen impaction right and left ears   Risks, including pain, dizziness, eardrum perforation, bleeding, infection and others as well as benefits were explained to the patient in detail. Verbal consent was obtained and the patient agreed to proceed.    We used "The Elephant Ear Irrigation Device" filled with lukewarm water for irrigation. A large amount wax was recovered from both ears. Procedure has also required manual wax removal/instrumentation with an ear wax curette and ear forceps on the right and left ears.   Tolerated well. Complications: None.   Postprocedure instructions :  Call if problems.    Lab Results  Component Value Date   WBC 5.9 04/03/2017   HGB 16.9 04/03/2017   HCT 49.0 04/03/2017   PLT 262.0 04/03/2017   GLUCOSE 113 (H) 11/26/2017   CHOL 193 03/10/2015   TRIG 104.0 03/10/2015   HDL 42.40 03/10/2015   LDLDIRECT 136.9 08/30/2010   LDLCALC 130 (H) 03/10/2015   ALT 42 04/03/2017   AST 24 04/03/2017   NA 138 11/26/2017   K 3.7 11/26/2017   CL 105 11/26/2017   CREATININE 0.87 11/26/2017   BUN 14 11/26/2017   CO2 26 11/26/2017   TSH 1.15 04/03/2017   PSA 0.65 03/10/2015   HGBA1C 5.5 11/26/2017    No results found.  Assessment & Plan:   There are no diagnoses linked to this encounter.   No orders of the defined types were placed in this encounter.    Follow-up: No follow-ups on file.  Walker Kehr, MD

## 2018-03-26 NOTE — Patient Instructions (Signed)
Try a weighted blanket

## 2018-03-26 NOTE — Assessment & Plan Note (Signed)
Lost wt Labs

## 2018-03-26 NOTE — Assessment & Plan Note (Signed)
Try a weighted blanket

## 2018-03-26 NOTE — Assessment & Plan Note (Signed)
Resolved w/wt loss

## 2018-03-26 NOTE — Assessment & Plan Note (Signed)
Ergonomic work station

## 2018-03-26 NOTE — Assessment & Plan Note (Signed)
Will irrigate 

## 2018-04-01 ENCOUNTER — Other Ambulatory Visit: Payer: Self-pay | Admitting: Internal Medicine

## 2018-04-01 DIAGNOSIS — D751 Secondary polycythemia: Secondary | ICD-10-CM

## 2018-04-05 ENCOUNTER — Telehealth: Payer: Self-pay | Admitting: Hematology & Oncology

## 2018-04-05 NOTE — Telephone Encounter (Signed)
Called and spoke with patient regarding appointments date/time/location were given and letter/calendar mailed as well

## 2018-04-18 ENCOUNTER — Other Ambulatory Visit (INDEPENDENT_AMBULATORY_CARE_PROVIDER_SITE_OTHER): Payer: Medicare Other

## 2018-04-18 ENCOUNTER — Encounter: Payer: Self-pay | Admitting: Internal Medicine

## 2018-04-18 ENCOUNTER — Ambulatory Visit (INDEPENDENT_AMBULATORY_CARE_PROVIDER_SITE_OTHER): Payer: Medicare Other | Admitting: Internal Medicine

## 2018-04-18 VITALS — BP 116/68 | HR 91 | Temp 98.1°F | Ht 66.0 in | Wt 214.0 lb

## 2018-04-18 DIAGNOSIS — E559 Vitamin D deficiency, unspecified: Secondary | ICD-10-CM | POA: Diagnosis not present

## 2018-04-18 DIAGNOSIS — R252 Cramp and spasm: Secondary | ICD-10-CM | POA: Diagnosis not present

## 2018-04-18 DIAGNOSIS — R202 Paresthesia of skin: Secondary | ICD-10-CM

## 2018-04-18 DIAGNOSIS — R42 Dizziness and giddiness: Secondary | ICD-10-CM | POA: Diagnosis not present

## 2018-04-18 LAB — TSH: TSH: 1.5 u[IU]/mL (ref 0.35–4.50)

## 2018-04-18 LAB — BASIC METABOLIC PANEL
BUN: 11 mg/dL (ref 6–23)
CO2: 30 mEq/L (ref 19–32)
Calcium: 9.4 mg/dL (ref 8.4–10.5)
Chloride: 103 mEq/L (ref 96–112)
Creatinine, Ser: 0.98 mg/dL (ref 0.40–1.50)
GFR: 85.75 mL/min (ref >60.00)
Glucose, Bld: 100 mg/dL — ABNORMAL HIGH (ref 70–99)
Potassium: 4 mEq/L (ref 3.5–5.1)
SODIUM: 139 meq/L (ref 135–145)

## 2018-04-18 LAB — MAGNESIUM: MAGNESIUM: 1.8 mg/dL (ref 1.5–2.5)

## 2018-04-18 LAB — VITAMIN D 25 HYDROXY (VIT D DEFICIENCY, FRACTURES): VITD: 28.84 ng/mL — ABNORMAL LOW (ref 30.00–100.00)

## 2018-04-18 LAB — VITAMIN B12: Vitamin B-12: 385 pg/mL (ref 211–911)

## 2018-04-18 NOTE — Patient Instructions (Addendum)
Magnesium tablets Foot X ray if not better Massage foot

## 2018-04-18 NOTE — Progress Notes (Signed)
Subjective:  Patient ID: Bruce Ellison, male    DOB: 1980/03/28  Age: 38 y.o. MRN: [id number]  CC: No chief complaint on file.   HPI Bruce Ellison presents for R foot cramp during water aerobics x 2 weeks C/o dizziness at time   Outpatient Medications Prior to Visit  Medication Sig Dispense Refill  . ARIPiprazole (ABILIFY) 5 MG tablet Take 1 tablet (5 mg total) by mouth daily. 90 tablet 0  . desonide (DESOWEN) 0.05 % cream     . hydrOXYzine (ATARAX/VISTARIL) 50 MG tablet Take 1 tablet every 8 hours as needed for anxiety or panic attacks 90 tablet 1  . loratadine (CLARITIN) 10 MG tablet Take 10 mg by mouth daily as needed for allergies.    . mirtazapine (REMERON) 15 MG tablet Take 1 tablet (15 mg total) by mouth at bedtime. 90 tablet 0  . Multiple Vitamin (MULTIVITAMIN WITH MINERALS) TABS Take 1 tablet by mouth every morning.    . venlafaxine XR (EFFEXOR-XR) 75 MG 24 hr capsule Take 1 capsule (75 mg total) by mouth daily with breakfast. 90 capsule 0   No facility-administered medications prior to visit.     ROS: Review of Systems  Constitutional: Negative for appetite change, fatigue and unexpected weight change.  HENT: Negative for congestion, nosebleeds, sneezing, sore throat and trouble swallowing.   Eyes: Negative for itching and visual disturbance.  Respiratory: Negative for cough.   Cardiovascular: Negative for chest pain, palpitations and leg swelling.  Gastrointestinal: Negative for abdominal distention, blood in stool, diarrhea and nausea.  Genitourinary: Negative for frequency and hematuria.  Musculoskeletal: Positive for myalgias. Negative for back pain, gait problem, joint swelling and neck pain.  Skin: Negative for rash.  Neurological: Negative for dizziness, tremors, speech difficulty and weakness.  Psychiatric/Behavioral: Negative for agitation, dysphoric mood and sleep disturbance. The patient is not nervous/anxious.     Objective:  BP 116/68 (BP Location: Left  Arm, Patient Position: Sitting, Cuff Size: Large)   Pulse 91   Temp 98.1 F (36.7 C) (Oral)   Ht 5\' 6"  (1.676 m)   Wt 214 lb (97.1 kg)   SpO2 98%   BMI 34.54 kg/m   BP Readings from Last 3 Encounters:  04/18/18 116/68  03/26/18 120/68  11/26/17 124/72    Wt Readings from Last 3 Encounters:  04/18/18 214 lb (97.1 kg)  03/26/18 214 lb (97.1 kg)  11/26/17 226 lb (102.5 kg)    Physical Exam Constitutional:      General: He is not in acute distress.    Appearance: He is well-developed.     Comments: NAD  Eyes:     Conjunctiva/sclera: Conjunctivae normal.     Pupils: Pupils are equal, round, and reactive to light.  Neck:     Musculoskeletal: Normal range of motion.     Thyroid: No thyromegaly.     Vascular: No JVD.  Cardiovascular:     Rate and Rhythm: Normal rate and regular rhythm.     Heart sounds: Normal heart sounds. No murmur. No friction rub. No gallop.   Pulmonary:     Effort: Pulmonary effort is normal. No respiratory distress.     Breath sounds: Normal breath sounds. No wheezing or rales.  Chest:     Chest wall: No tenderness.  Abdominal:     General: Bowel sounds are normal. There is no distension.     Palpations: Abdomen is soft. There is no mass.     Tenderness: There is no  abdominal tenderness. There is no guarding or rebound.  Musculoskeletal: Normal range of motion.        General: No tenderness.  Lymphadenopathy:     Cervical: No cervical adenopathy.  Skin:    General: Skin is warm and dry.     Findings: No rash.  Neurological:     Mental Status: He is alert and oriented to person, place, and time.     Cranial Nerves: No cranial nerve deficit.     Motor: No abnormal muscle tone.     Coordination: Coordination normal.     Gait: Gait normal.     Deep Tendon Reflexes: Reflexes are normal and symmetric.  Psychiatric:        Behavior: Behavior normal.        Thought Content: Thought content normal.        Judgment: Judgment normal.    R medial  foot is a little tender  Lab Results  Component Value Date   WBC 5.5 03/26/2018   HGB 17.1 (H) 03/26/2018   HCT 50.1 03/26/2018   PLT 266.0 03/26/2018   GLUCOSE 107 (H) 03/26/2018   CHOL 193 03/10/2015   TRIG 104.0 03/10/2015   HDL 42.40 03/10/2015   LDLDIRECT 136.9 08/30/2010   LDLCALC 130 (H) 03/10/2015   ALT 42 04/03/2017   AST 24 04/03/2017   NA 139 03/26/2018   K 3.9 03/26/2018   CL 104 03/26/2018   CREATININE 0.91 03/26/2018   BUN 15 03/26/2018   CO2 25 03/26/2018   TSH 1.14 03/26/2018   PSA 0.65 03/10/2015   HGBA1C 5.4 03/26/2018    No results found.  Assessment & Plan:   There are no diagnoses linked to this encounter.   No orders of the defined types were placed in this encounter.    Follow-up: No follow-ups on file.  Walker Kehr, MD

## 2018-04-18 NOTE — Assessment & Plan Note (Signed)
Labs Magnesium Foot X ray if not better massage

## 2018-04-18 NOTE — Assessment & Plan Note (Signed)
?  due to meds

## 2018-04-21 ENCOUNTER — Other Ambulatory Visit: Payer: Self-pay | Admitting: Internal Medicine

## 2018-04-21 MED ORDER — VITAMIN D3 50 MCG (2000 UT) PO CAPS: 2000.0000 [IU] | ORAL_CAPSULE | Freq: Every day | ORAL | 3 refills | Status: DC

## 2018-04-21 MED ORDER — VITAMIN D3 1.25 MG (50000 UT) PO CAPS: 1.0000 | ORAL_CAPSULE | ORAL | 0 refills | Status: DC

## 2018-05-03 ENCOUNTER — Ambulatory Visit: Payer: Self-pay | Admitting: Hematology & Oncology

## 2018-05-03 ENCOUNTER — Other Ambulatory Visit (INDEPENDENT_AMBULATORY_CARE_PROVIDER_SITE_OTHER): Payer: Medicare Other

## 2018-05-03 ENCOUNTER — Ambulatory Visit (INDEPENDENT_AMBULATORY_CARE_PROVIDER_SITE_OTHER)
Admission: RE | Admit: 2018-05-03 | Discharge: 2018-05-03 | Disposition: A | Discharge status: Home / Self Care | Payer: Medicare Other | Source: Ambulatory Visit | Attending: Family | Admitting: Family

## 2018-05-03 ENCOUNTER — Ambulatory Visit (INDEPENDENT_AMBULATORY_CARE_PROVIDER_SITE_OTHER): Payer: Medicare Other | Admitting: Family

## 2018-05-03 ENCOUNTER — Encounter: Payer: Self-pay | Admitting: Family

## 2018-05-03 ENCOUNTER — Other Ambulatory Visit: Payer: Self-pay

## 2018-05-03 VITALS — BP 120/70 | HR 101 | Temp 98.6°F | Ht 66.0 in | Wt 212.1 lb

## 2018-05-03 DIAGNOSIS — G4730 Sleep apnea, unspecified: Secondary | ICD-10-CM

## 2018-05-03 DIAGNOSIS — R0602 Shortness of breath: Secondary | ICD-10-CM

## 2018-05-03 DIAGNOSIS — D649 Anemia, unspecified: Secondary | ICD-10-CM | POA: Diagnosis not present

## 2018-05-03 DIAGNOSIS — R0600 Dyspnea, unspecified: Secondary | ICD-10-CM | POA: Diagnosis not present

## 2018-05-03 DIAGNOSIS — D582 Other hemoglobinopathies: Secondary | ICD-10-CM

## 2018-05-03 DIAGNOSIS — G479 Sleep disorder, unspecified: Secondary | ICD-10-CM | POA: Diagnosis not present

## 2018-05-03 LAB — CBC WITH DIFFERENTIAL/PLATELET
Basophils Absolute: 0.1 10*3/uL (ref 0.0–0.1)
Basophils Relative: 0.9 % (ref 0.0–3.0)
Eosinophils Absolute: 0.2 10*3/uL (ref 0.0–0.7)
Eosinophils Relative: 3.5 % (ref 0.0–5.0)
HCT: 50.4 % (ref 39.0–52.0)
Hemoglobin: 17.3 g/dL — ABNORMAL HIGH (ref 13.0–17.0)
Lymphocytes Relative: 19.9 % (ref 12.0–46.0)
Lymphs Abs: 1.3 10*3/uL (ref 0.7–4.0)
MCHC: 34.3 g/dL (ref 30.0–36.0)
MCV: 89.7 fl (ref 78.0–100.0)
MONO ABS: 0.6 10*3/uL (ref 0.1–1.0)
Monocytes Relative: 9.9 % (ref 3.0–12.0)
NEUTROS PCT: 65.8 % (ref 43.0–77.0)
Neutro Abs: 4.2 10*3/uL (ref 1.4–7.7)
Platelets: 247 10*3/uL (ref 150.0–400.0)
RBC: 5.62 Mil/uL (ref 4.22–5.81)
RDW: 13.5 % (ref 11.5–15.5)
WBC: 6.4 10*3/uL (ref 4.0–10.5)

## 2018-05-03 NOTE — Progress Notes (Signed)
Bruce Ellison is a 38 y.o. male with the following history as recorded in [employer]:  Patient Active Problem List   Diagnosis Date Noted  . Foot cramps 04/18/2018  . Lightheadedness 04/18/2018  . Rash of groin 06/29/2017  . Intertriginous candidiasis 06/13/2017  . Family history of malignant neoplasm of prostate 05/18/2017  . Renal cyst 05/16/2017  . Tachycardia 04/03/2017  . Varicella vaccination 10/13/2016  . Spider veins of both lower extremities 09/12/2016  . Acute pharyngitis 08/19/2015  . Hyperlipidemia 03/10/2015  . Nonallopathic lesion-rib cage 12/31/2013  . Nonallopathic lesion of lumbosacral region 12/31/2013  . Sleep difficulties 12/28/2013  . Diarrhea 10/10/2013  . Cervical disc disorder with radiculopathy of cervical region 06/11/2013  . Acute thoracic back pain 05/28/2013  . Muscle spasm of back 03/24/2013  . Nonallopathic lesion of thoracic region 03/24/2013  . Finger wound, simple, open 12/08/2012  . Abdominal distention 12/06/2012  . Sinusitis nasal 06/04/2012  . Hyperglycemia 06/04/2012  . Weight gain 06/04/2012  . Severe episode of recurrent major depressive disorder ([place]) 05/28/2012  . Generalized anxiety disorder 05/28/2012  . Varicose veins of lower extremities with other complications 86/76/7209  . Sore throat 02/16/2012  . OSA (obstructive sleep apnea) 02/14/2011  . Polycythemia 12/14/2010  . Cerumen impaction 06/24/2010  . VITAMIN D DEFICIENCY 12/25/2008  . SEBORRHEIC DERMATITIS 07/23/2008  . FREQUENCY, URINARY 07/23/2008  . CELLULITIS/ABSCESS, FOOT 09/11/2007  . GLUCOSE INTOLERANCE 04/11/2007  . Allergic rhinitis 04/11/2007  . GERD 04/11/2007  . Backache 04/11/2007    Current Outpatient Medications  Medication Sig Dispense Refill  . ARIPiprazole (ABILIFY) 5 MG tablet Take 1 tablet (5 mg total) by mouth daily. 90 tablet 0  . Cholecalciferol (VITAMIN D3) 1.25 MG (50000 UT) CAPS Take 1 capsule by mouth once a week. 6 capsule 0  . Cholecalciferol  (VITAMIN D3) 50 MCG (2000 UT) capsule Take 1 capsule (2,000 Units total) by mouth daily. 100 capsule 3  . desonide (DESOWEN) 0.05 % cream     . hydrOXYzine (ATARAX/VISTARIL) 50 MG tablet Take 1 tablet every 8 hours as needed for anxiety or panic attacks 90 tablet 1  . loratadine (CLARITIN) 10 MG tablet Take 10 mg by mouth daily as needed for allergies.    . mirtazapine (REMERON) 15 MG tablet Take 1 tablet (15 mg total) by mouth at bedtime. 90 tablet 0  . Multiple Vitamin (MULTIVITAMIN WITH MINERALS) TABS Take 1 tablet by mouth every morning.    . venlafaxine XR (EFFEXOR-XR) 75 MG 24 hr capsule Take 1 capsule (75 mg total) by mouth daily with breakfast. 90 capsule 0   No current facility-administered medications for this visit.     Allergies: Honey bee treatment [bee venom]; Doxycycline; Morphine; Quetiapine; Penicillins; Sulfa antibiotics; and Sulfamethoxazole  Past Medical History:  Diagnosis Date  . Adenomatous colon polyp    tubular  . Allergic rhinitis   . Anxiety   . Depression   . GERD (gastroesophageal reflux disease)   . Glucose intolerance (impaired glucose tolerance)   . HTN (hypertension)    borderline  . Insomnia   . Obesity   . OCD (obsessive compulsive disorder)   . Personality disorder ([place])    with schizoid features  . Sleep apnea   . Tourette's    per [facility]  . Varicose veins of both lower extremities   . Vitamin D deficiency     Past Surgical History:  Procedure Laterality Date  . adnoidectomy  1992    Family History  Problem Relation Age of Onset  . Depression Mother   . Bipolar disorder Father   . Colon cancer Other   . Cholelithiasis Maternal Grandmother   . Diabetes Maternal Grandmother   . Prostate cancer Maternal Grandfather   . Colon polyps Maternal Grandfather   . Esophageal cancer Neg Hx   . Rectal cancer Neg Hx   . Stomach cancer Neg Hx     Social History   Tobacco Use  . Smoking status: Never Smoker  . Smokeless tobacco: Never  Used  Substance Use Topics  . Alcohol use: No    Alcohol/week: 0.0 standard drinks    Subjective:   Patient presents with concerns for persisting fatigue; was seen by his PCP 2 weeks ago with similar symptoms; extensive labs were done at that time which were normal except for low Vitamin D; accompanied by his mother today who helps provide the history; is actively exercising to lose weight- swimming at the [facility] 3 days per week; notes that stopped his MVI at the end of January and feels symptoms have worsened since stopping the MVI; no prior history of asthma/ seasonal allergies- but is adamant this is not related to his allergies; has been diagnosed with sleep apnea- did not find the CPAP beneficial;    Objective:  Vitals:   05/03/18 1557  BP: 120/70  Pulse: (!) 101  Temp: 98.6 F (37 C)  TempSrc: Oral  SpO2: 97%  Weight: 212 lb 1.3 oz (96.2 kg)  Height: 5\' 6"  (1.676 m)    General: Well developed, well nourished, in no acute distress  Skin : Warm and dry.  Head: Normocephalic and atraumatic  Eyes: Sclera and conjunctiva clear; pupils round and reactive to light; extraocular movements intact  Ears: External normal; canals clear; tympanic membranes normal  Oropharynx: Pink, supple. No suspicious lesions  Neck: Supple without thyromegaly, adenopathy  Lungs: Respirations unlabored; clear to auscultation bilaterally without wheeze, rales, rhonchi  CVS exam: normal rate and regular rhythm.  Neurologic: Alert and oriented; speech intact; face symmetrical; moves all extremities well; CNII-XII intact without focal deficit   Assessment:  1. Shortness of breath   2. Elevated hemoglobin (HCC)   3. Sleep difficulties   4. Sleep apnea, unspecified type     Plan:  Labs were done 2 weeks ago- reassuring; EKG done today- NSR; update CXR today; patient's mother is very concerned about his iron studies and will update his ferritin, TIBC, iron studies; will most likely need to refer for sleep  study- am suspicious that sleep apnea is source of symptoms;   No follow-ups on file.  Orders Placed This Encounter  Procedures  . DG Chest 2 View    Standing Status:   Future    Number of Occurrences:   1    Standing Expiration Date:   07/02/2019    Order Specific Question:   Reason for Exam (SYMPTOM  OR DIAGNOSIS REQUIRED)    Answer:   shortness of breath    Order Specific Question:   Preferred imaging location?    Answer:   [facility]    Order Specific Question:   Radiology Contrast Protocol - do NOT remove file path    Answer:   [email or web address]  . CBC with Differential/Platelet    Standing Status:   Future    Number of Occurrences:   1    Standing Expiration Date:   05/03/2019  . Iron, TIBC and Ferritin Panel    Standing Status:   Future  Number of Occurrences:   1    Standing Expiration Date:   05/04/2019  . EKG 12-Lead    Requested Prescriptions    No prescriptions requested or ordered in this encounter

## 2018-05-04 LAB — IRON,TIBC AND FERRITIN PANEL
%SAT: 18 % (calc) — ABNORMAL LOW (ref 20–48)
Ferritin: 96 ng/mL (ref 38–380)
Iron: 65 ug/dL (ref 50–180)
TIBC: 370 mcg/dL (calc) (ref 250–425)

## 2018-05-06 ENCOUNTER — Other Ambulatory Visit: Payer: Self-pay | Admitting: Family

## 2018-05-06 ENCOUNTER — Encounter (HOSPITAL_COMMUNITY): Payer: Self-pay | Admitting: Psychiatry

## 2018-05-06 ENCOUNTER — Ambulatory Visit (INDEPENDENT_AMBULATORY_CARE_PROVIDER_SITE_OTHER): Payer: Medicare Other | Admitting: Psychiatry

## 2018-05-06 DIAGNOSIS — F411 Generalized anxiety disorder: Secondary | ICD-10-CM

## 2018-05-06 DIAGNOSIS — F429 Obsessive-compulsive disorder, unspecified: Secondary | ICD-10-CM | POA: Diagnosis not present

## 2018-05-06 DIAGNOSIS — G4730 Sleep apnea, unspecified: Secondary | ICD-10-CM

## 2018-05-06 DIAGNOSIS — D751 Secondary polycythemia: Secondary | ICD-10-CM

## 2018-05-06 MED ORDER — MIRTAZAPINE 15 MG PO TABS: 15.0000 mg | ORAL_TABLET | Freq: Every day | ORAL | 0 refills | Status: DC

## 2018-05-06 MED ORDER — ARIPIPRAZOLE 5 MG PO TABS: 5.0000 mg | ORAL_TABLET | Freq: Every day | ORAL | 0 refills | Status: DC

## 2018-05-06 MED ORDER — VENLAFAXINE HCL ER 75 MG PO CP24: 75.0000 mg | ORAL_CAPSULE | Freq: Every day | ORAL | 0 refills | Status: DC

## 2018-05-06 NOTE — Progress Notes (Signed)
[place] MD/PA/NP OP Progress Note  05/06/2018 3:06 PM Bruce Ellison  MRN:  [id number]  Chief Complaint: Having dizzy spells.  I started since I am taking the vitamin D.  HPI: Patient came for his appointment.  He endorses depression is a stable but he noticed having dizzy spells.  He noticed since taking the vitamin D.  Patient told his PCP added vitamin D because he was having muscle cramps.  His vitamin D was low.  He endorses cramps are much better but now he is having dizziness.  Patient told he read the side effects of vitamin D and he is wondering if this is causing the dizziness.  Overall his depression and anxiety is stable.  His OCD is under control.  He does not want to change his medication.  He admitted some time feels fatigued but no other major concern.  He denies any paranoia, hallucination or any crying spells.  He denies any suicidal thoughts or homicidal thought.  He denies drinking or using any illegal substances.  He lives with his mother and his stepfather.  Visit Diagnosis:    ICD-10-CM   1. GAD (generalized anxiety disorder) F41.1 venlafaxine XR (EFFEXOR-XR) 75 MG 24 hr capsule    mirtazapine (REMERON) 15 MG tablet  2. Obsessive-compulsive disorder, unspecified type F42.9 mirtazapine (REMERON) 15 MG tablet    ARIPiprazole (ABILIFY) 5 MG tablet    Past Psychiatric History: Reviewed. H/O anxiety, Tic and depression since age 86.  Received treatment in [place].  Tried Geodon, Risperdal, Zoloft, Valium, Klonopin, Xanax, Prozac, Ambien, and Seroquel.  Saw psychiatrist at [place] and then in [place] and [place].  Tried Luvox but made him more irritable.  No history of suicidal attempt.  Past Medical History:  Past Medical History:  Diagnosis Date  . Adenomatous colon polyp    tubular  . Allergic rhinitis   . Anxiety   . Depression   . GERD (gastroesophageal reflux disease)   . Glucose intolerance (impaired glucose tolerance)   . HTN (hypertension)     borderline  . Insomnia   . Obesity   . OCD (obsessive compulsive disorder)   . Personality disorder ([place])    with schizoid features  . Sleep apnea   . Tourette's    per [facility]  . Varicose veins of both lower extremities   . Vitamin D deficiency     Past Surgical History:  Procedure Laterality Date  . adnoidectomy  1992    Family Psychiatric History: Reviewed.  Family History:  Family History  Problem Relation Age of Onset  . Depression Mother   . Bipolar disorder Father   . Colon cancer Other   . Cholelithiasis Maternal Grandmother   . Diabetes Maternal Grandmother   . Prostate cancer Maternal Grandfather   . Colon polyps Maternal Grandfather   . Esophageal cancer Neg Hx   . Rectal cancer Neg Hx   . Stomach cancer Neg Hx     Social History:  Social History   Socioeconomic History  . Marital status: Single    Spouse name: Not on file  . Number of children: 0  . Years of education: Not on file  . Highest education level: Not on file  Occupational History  . Occupation: unemployed  Social Needs  . Financial resource strain: Not on file  . Food insecurity:    Worry: Not on file    Inability: Not on file  . Transportation needs:    Medical: Not  on file    Non-medical: Not on file  Tobacco Use  . Smoking status: Never Smoker  . Smokeless tobacco: Never Used  Substance and Sexual Activity  . Alcohol use: No    Alcohol/week: 0.0 standard drinks  . Drug use: No  . Sexual activity: Not Currently  Lifestyle  . Physical activity:    Days per week: Not on file    Minutes per session: Not on file  . Stress: Not on file  Relationships  . Social connections:    Talks on phone: Not on file    Gets together: Not on file    Attends religious service: Not on file    Active member of club or organization: Not on file    Attends meetings of clubs or organizations: Not on file    Relationship status: Not on file  Other Topics Concern  . Not on file  Social  History Narrative   Lives with parents.   Regular exercise- No    Allergies:  Allergies  Allergen Reactions  . Honey Bee Treatment [Bee Venom] Anaphylaxis, Hives and Rash    Pt reports he does not go into anaphylaxis with bee stings, just honey  . Doxycycline   . Morphine Other (See Comments)    Pt doesn't like side effects  . Quetiapine Other (See Comments)    restlessness  . Penicillins Hives and Rash  . Sulfa Antibiotics Hives and Rash  . Sulfamethoxazole Rash and Hives    Metabolic Disorder Labs: Recent Results (from the past 2160 hour(s))  CBC with Differential/Platelet     Status: Abnormal   Collection Time: 03/26/18 11:17 AM  Result Value Ref Range   WBC 5.5 4.0 - 10.5 K/uL   RBC 5.62 4.22 - 5.81 Mil/uL   Hemoglobin 17.1 (H) 13.0 - 17.0 g/dL   HCT 50.1 39.0 - 52.0 %   MCV 89.2 78.0 - 100.0 fl   MCHC 34.1 30.0 - 36.0 g/dL   RDW 13.7 11.5 - 15.5 %   Platelets 266.0 150.0 - 400.0 K/uL   Neutrophils Relative % 60.6 43.0 - 77.0 %   Lymphocytes Relative 24.6 12.0 - 46.0 %   Monocytes Relative 11.5 3.0 - 12.0 %   Eosinophils Relative 2.6 0.0 - 5.0 %   Basophils Relative 0.7 0.0 - 3.0 %   Neutro Abs 3.4 1.4 - 7.7 K/uL   Lymphs Abs 1.4 0.7 - 4.0 K/uL   Monocytes Absolute 0.6 0.1 - 1.0 K/uL   Eosinophils Absolute 0.1 0.0 - 0.7 K/uL   Basophils Absolute 0.0 0.0 - 0.1 K/uL  Basic metabolic panel     Status: Abnormal   Collection Time: 03/26/18 11:17 AM  Result Value Ref Range   Sodium 139 135 - 145 mEq/L   Potassium 3.9 3.5 - 5.1 mEq/L   Chloride 104 96 - 112 mEq/L   CO2 25 19 - 32 mEq/L   Glucose, Bld 107 (H) 70 - 99 mg/dL   BUN 15 6 - 23 mg/dL   Creatinine, Ser 0.91 0.40 - 1.50 mg/dL   Calcium 9.6 8.4 - 10.5 mg/dL   GFR 93.44 >60.00 mL/min  TSH     Status: None   Collection Time: 03/26/18 11:17 AM  Result Value Ref Range   TSH 1.14 0.35 - 4.50 uIU/mL  Hemoglobin A1c     Status: None   Collection Time: 03/26/18 11:17 AM  Result Value Ref Range   Hgb A1c MFr  Bld 5.4 4.6 - 6.5 %  Comment: Glycemic Control Guidelines for People with Diabetes:Non Diabetic:  <6%Goal of Therapy: <7%Additional Action Suggested:  >8%   VITAMIN D 25 Hydroxy (Vit-D Deficiency, Fractures)     Status: Abnormal   Collection Time: 04/18/18 11:29 AM  Result Value Ref Range   VITD 28.84 (L) 30.00 - 100.00 ng/mL  Vitamin B12     Status: None   Collection Time: 04/18/18 11:29 AM  Result Value Ref Range   Vitamin B-12 385 211 - 911 pg/mL  Magnesium     Status: None   Collection Time: 04/18/18 11:29 AM  Result Value Ref Range   Magnesium 1.8 1.5 - 2.5 mg/dL  TSH     Status: None   Collection Time: 04/18/18 11:29 AM  Result Value Ref Range   TSH 1.50 0.35 - 4.50 uIU/mL  Basic metabolic panel     Status: Abnormal   Collection Time: 04/18/18 11:29 AM  Result Value Ref Range   Sodium 139 135 - 145 mEq/L   Potassium 4.0 3.5 - 5.1 mEq/L   Chloride 103 96 - 112 mEq/L   CO2 30 19 - 32 mEq/L   Glucose, Bld 100 (H) 70 - 99 mg/dL   BUN 11 6 - 23 mg/dL   Creatinine, Ser 0.98 0.40 - 1.50 mg/dL   Calcium 9.4 8.4 - 10.5 mg/dL   GFR 85.75 >60.00 mL/min  Iron, TIBC and Ferritin Panel     Status: Abnormal   Collection Time: 05/03/18  4:45 PM  Result Value Ref Range   Iron 65 50 - 180 mcg/dL   TIBC 370 250 - 425 mcg/dL (calc)   %SAT 18 (L) 20 - 48 % (calc)   Ferritin 96 38 - 380 ng/mL  CBC with Differential/Platelet     Status: Abnormal   Collection Time: 05/03/18  4:45 PM  Result Value Ref Range   WBC 6.4 4.0 - 10.5 K/uL   RBC 5.62 4.22 - 5.81 Mil/uL   Hemoglobin 17.3 (H) 13.0 - 17.0 g/dL   HCT 50.4 39.0 - 52.0 %   MCV 89.7 78.0 - 100.0 fl   MCHC 34.3 30.0 - 36.0 g/dL   RDW 13.5 11.5 - 15.5 %   Platelets 247.0 150.0 - 400.0 K/uL   Neutrophils Relative % 65.8 43.0 - 77.0 %   Lymphocytes Relative 19.9 12.0 - 46.0 %   Monocytes Relative 9.9 3.0 - 12.0 %   Eosinophils Relative 3.5 0.0 - 5.0 %   Basophils Relative 0.9 0.0 - 3.0 %   Neutro Abs 4.2 1.4 - 7.7 K/uL   Lymphs  Abs 1.3 0.7 - 4.0 K/uL   Monocytes Absolute 0.6 0.1 - 1.0 K/uL   Eosinophils Absolute 0.2 0.0 - 0.7 K/uL   Basophils Absolute 0.1 0.0 - 0.1 K/uL   Lab Results  Component Value Date   HGBA1C 5.4 03/26/2018   No results found for: PROLACTIN Lab Results  Component Value Date   CHOL 193 03/10/2015   TRIG 104.0 03/10/2015   HDL 42.40 03/10/2015   CHOLHDL 5 03/10/2015   VLDL 20.8 03/10/2015   LDLCALC 130 (H) 03/10/2015   LDLCALC 130 (H) 05/02/2006   Lab Results  Component Value Date   TSH 1.50 04/18/2018   TSH 1.14 03/26/2018    Therapeutic Level Labs: No results found for: LITHIUM No results found for: VALPROATE No components found for:  CBMZ  Current Medications: Current Outpatient Medications  Medication Sig Dispense Refill  . ARIPiprazole (ABILIFY) 5 MG tablet Take 1 tablet (5  mg total) by mouth daily. 90 tablet 0  . Cholecalciferol (VITAMIN D3) 1.25 MG (50000 UT) CAPS Take 1 capsule by mouth once a week. 6 capsule 0  . Cholecalciferol (VITAMIN D3) 50 MCG (2000 UT) capsule Take 1 capsule (2,000 Units total) by mouth daily. 100 capsule 3  . desonide (DESOWEN) 0.05 % cream     . mirtazapine (REMERON) 15 MG tablet Take 1 tablet (15 mg total) by mouth at bedtime. 90 tablet 0  . venlafaxine XR (EFFEXOR-XR) 75 MG 24 hr capsule Take 1 capsule (75 mg total) by mouth daily with breakfast. 90 capsule 0  . hydrOXYzine (ATARAX/VISTARIL) 50 MG tablet Take 1 tablet every 8 hours as needed for anxiety or panic attacks (Patient not taking: Reported on 05/06/2018) 90 tablet 1  . loratadine (CLARITIN) 10 MG tablet Take 10 mg by mouth daily as needed for allergies.    . Multiple Vitamin (MULTIVITAMIN WITH MINERALS) TABS Take 1 tablet by mouth every morning.     No current facility-administered medications for this visit.      Musculoskeletal: Strength & Muscle Tone: within normal limits Gait & Station: normal Patient leans: N/A  Psychiatric Specialty Exam: Review of Systems   Neurological: Positive for dizziness.    Blood pressure 119/77, pulse 82, height 5\' 6"  (1.676 m), weight 215 lb (97.5 kg), SpO2 97 %.Body mass index is 34.7 kg/m.  General Appearance: Fairly Groomed  Eye Contact:  Good  Speech:  Clear and Coherent  Volume:  Normal  Mood:  Euthymic  Affect:  Congruent  Thought Process:  Goal Directed  Orientation:  Full (Time, Place, and Person)  Thought Content: Logical   Suicidal Thoughts:  No  Homicidal Thoughts:  No  Memory:  Immediate;   Good Recent;   Good Remote;   Good  Judgement:  Good  Insight:  Good  Psychomotor Activity:  Normal  Concentration:  Concentration: Good and Attention Span: Fair  Recall:  Good  Fund of Knowledge: Good  Language: Good  Akathisia:  No  Handed:  Right  AIMS (if indicated): not done  Assets:  Communication Skills Desire for Improvement Housing Resilience Transportation  ADL's:  Intact  Cognition: WNL  Sleep:  Fair   Screenings:   Assessment and Plan: Generalized anxiety disorder.  Obsessive-compulsive disorder.  Recommended to have his PCP called about dizziness which he believes related to vitamin D.  Patient agreed with the plan.  He does not want to change his psychotropic medication.  I will continue Effexor 75 mg daily, Remeron 15 mg at bedtime and Abilify 5 mg daily.  He is not interested in therapy.  Recommended to call us back if is any question or any concern.  Follow-up in 3 months.   Kathlee Nations, MD 05/06/2018, 3:06 PM

## 2018-05-10 ENCOUNTER — Other Ambulatory Visit: Payer: Self-pay

## 2018-05-10 ENCOUNTER — Emergency Department (HOSPITAL_COMMUNITY)
Admission: EM | Admit: 2018-05-10 | Discharge: 2018-05-10 | Disposition: A | Discharge status: Home / Self Care | Payer: Medicare Other | Attending: Emergency Medicine | Admitting: Emergency Medicine

## 2018-05-10 ENCOUNTER — Encounter (HOSPITAL_COMMUNITY): Payer: Self-pay

## 2018-05-10 DIAGNOSIS — I10 Essential (primary) hypertension: Secondary | ICD-10-CM | POA: Insufficient documentation

## 2018-05-10 DIAGNOSIS — Z79899 Other long term (current) drug therapy: Secondary | ICD-10-CM | POA: Insufficient documentation

## 2018-05-10 DIAGNOSIS — R42 Dizziness and giddiness: Secondary | ICD-10-CM | POA: Diagnosis not present

## 2018-05-10 LAB — URINALYSIS, ROUTINE W REFLEX MICROSCOPIC
Bilirubin Urine: NEGATIVE
Glucose, UA: NEGATIVE mg/dL
Hgb urine dipstick: NEGATIVE
KETONES UR: NEGATIVE mg/dL
Leukocytes,Ua: NEGATIVE
Nitrite: NEGATIVE
Protein, ur: NEGATIVE mg/dL
Specific Gravity, Urine: 1.005 (ref 1.005–1.030)
pH: 9 — ABNORMAL HIGH (ref 5.0–8.0)

## 2018-05-10 LAB — CBC WITH DIFFERENTIAL/PLATELET
Abs Immature Granulocytes: 0.01 10*3/uL (ref 0.00–0.07)
Basophils Absolute: 0 10*3/uL (ref 0.0–0.1)
Basophils Relative: 1 %
Eosinophils Absolute: 0.2 10*3/uL (ref 0.0–0.5)
Eosinophils Relative: 4 %
HCT: 51.2 % (ref 39.0–52.0)
HEMOGLOBIN: 16.7 g/dL (ref 13.0–17.0)
Immature Granulocytes: 0 %
Lymphocytes Relative: 28 %
Lymphs Abs: 1.1 10*3/uL (ref 0.7–4.0)
MCH: 30.1 pg (ref 26.0–34.0)
MCHC: 32.6 g/dL (ref 30.0–36.0)
MCV: 92.4 fL (ref 80.0–100.0)
Monocytes Absolute: 0.5 10*3/uL (ref 0.1–1.0)
Monocytes Relative: 13 %
Neutro Abs: 2.2 10*3/uL (ref 1.7–7.7)
Neutrophils Relative %: 54 %
Platelets: 219 10*3/uL (ref 150–400)
RBC: 5.54 MIL/uL (ref 4.22–5.81)
RDW: 12.9 % (ref 11.5–15.5)
WBC: 4.1 10*3/uL (ref 4.0–10.5)
nRBC: 0 % (ref 0.0–0.2)

## 2018-05-10 LAB — COMPREHENSIVE METABOLIC PANEL
ALT: 38 U/L (ref 0–44)
AST: 27 U/L (ref 15–41)
Albumin: 4.4 g/dL (ref 3.5–5.0)
Alkaline Phosphatase: 68 U/L (ref 38–126)
Anion gap: 7 (ref 5–15)
BUN: 14 mg/dL (ref 6–20)
CO2: 25 mmol/L (ref 22–32)
Calcium: 9.4 mg/dL (ref 8.9–10.3)
Chloride: 105 mmol/L (ref 98–111)
Creatinine, Ser: 0.94 mg/dL (ref 0.61–1.24)
GFR calc Af Amer: >60 mL/min (ref >60)
GFR calc non Af Amer: >60 mL/min (ref >60)
Glucose, Bld: 121 mg/dL — ABNORMAL HIGH (ref 70–99)
Potassium: 3.8 mmol/L (ref 3.5–5.1)
SODIUM: 137 mmol/L (ref 135–145)
Total Bilirubin: 0.8 mg/dL (ref 0.3–1.2)
Total Protein: 7.4 g/dL (ref 6.5–8.1)

## 2018-05-10 MED ORDER — SODIUM CHLORIDE 0.9 % IV BOLUS
1000.0000 mL | Freq: Once | INTRAVENOUS | Status: AC
  Administered 2018-05-10: 1000 mL via INTRAVENOUS

## 2018-05-10 MED ORDER — MECLIZINE HCL 25 MG PO TABS
25.0000 mg | ORAL_TABLET | Freq: Once | ORAL | Status: AC
  Administered 2018-05-10: 25 mg via ORAL
  Filled 2018-05-10: qty 1

## 2018-05-10 MED ORDER — MECLIZINE HCL 12.5 MG PO TABS: 12.5000 mg | ORAL_TABLET | Freq: Three times a day (TID) | ORAL | 0 refills | Status: AC | PRN

## 2018-05-10 NOTE — Discharge Instructions (Addendum)
All your laboratory results were unremarkable today. I have prescribed medication to help with your symptoms. Please take this medication as directed. Please follow up with your primary care physician as needed.

## 2018-05-10 NOTE — ED Provider Notes (Signed)
[facility] DEPT Provider Note   CSN: [id number] Arrival date & time: 05/10/18  7829    History   Chief Complaint Chief Complaint  Patient presents with  . Dizziness    HPI Bruce Ellison is a 38 y.o. male.     38 y.o male with a PMH of HTN borderline, OCD, extensive psychiatric history presents to the ED with a chief complaint of dizziness x 3 weeks. Patient reports experiencing constant dizziness worse with movement and worse with turning of his head. He also endorses some head pain along the frontal region. Patient was seen at PCP office 1 week ago and had a negative chest xray, EKG and normal labs aside from low vitamin D. He was placed on vitamin D 50,000 daily reports the dizziness started shortly after. Patient's mother at the bedside reports he has been more fatigued and weak lately stating that he usually ambulates throughout the day but has decreased his activity.  Patient does have an appointment to follow-up with a hematologist on 05/21/2018.  He denies any shortness of breath, chest pain,or vomiting episodes.     Past Medical History:  Diagnosis Date  . Adenomatous colon polyp    tubular  . Allergic rhinitis   . Anxiety   . Depression   . GERD (gastroesophageal reflux disease)   . Glucose intolerance (impaired glucose tolerance)   . HTN (hypertension)    borderline  . Insomnia   . Obesity   . OCD (obsessive compulsive disorder)   . Personality disorder ([place])    with schizoid features  . Sleep apnea   . Tourette's    per [facility] neuro  . Varicose veins of both lower extremities   . Vitamin D deficiency     Patient Active Problem List   Diagnosis Date Noted  . Foot cramps 04/18/2018  . Lightheadedness 04/18/2018  . Rash of groin 06/29/2017  . Intertriginous candidiasis 06/13/2017  . Family history of malignant neoplasm of prostate 05/18/2017  . Renal cyst 05/16/2017  . Tachycardia 04/03/2017  . Varicella vaccination  10/13/2016  . Spider veins of both lower extremities 09/12/2016  . Acute pharyngitis 08/19/2015  . Hyperlipidemia 03/10/2015  . Nonallopathic lesion-rib cage 12/31/2013  . Nonallopathic lesion of lumbosacral region 12/31/2013  . Sleep difficulties 12/28/2013  . Diarrhea 10/10/2013  . Cervical disc disorder with radiculopathy of cervical region 06/11/2013  . Acute thoracic back pain 05/28/2013  . Muscle spasm of back 03/24/2013  . Nonallopathic lesion of thoracic region 03/24/2013  . Finger wound, simple, open 12/08/2012  . Abdominal distention 12/06/2012  . Sinusitis nasal 06/04/2012  . Hyperglycemia 06/04/2012  . Weight gain 06/04/2012  . Severe episode of recurrent major depressive disorder ([place]) 05/28/2012  . Generalized anxiety disorder 05/28/2012  . Varicose veins of lower extremities with other complications 56/21/3086  . Sore throat 02/16/2012  . OSA (obstructive sleep apnea) 02/14/2011  . Polycythemia 12/14/2010  . Cerumen impaction 06/24/2010  . VITAMIN D DEFICIENCY 12/25/2008  . SEBORRHEIC DERMATITIS 07/23/2008  . FREQUENCY, URINARY 07/23/2008  . CELLULITIS/ABSCESS, FOOT 09/11/2007  . GLUCOSE INTOLERANCE 04/11/2007  . Allergic rhinitis 04/11/2007  . GERD 04/11/2007  . Backache 04/11/2007    Past Surgical History:  Procedure Laterality Date  . adnoidectomy  1992        Home Medications    Prior to Admission medications   Medication Sig Start Date End Date Taking? Authorizing Provider  ARIPiprazole (ABILIFY) 5 MG tablet Take 1 tablet (5 mg total)  by mouth daily. 05/06/18  Yes [name], Arlyce Harman, MD  Cholecalciferol (VITAMIN D3) 50 MCG (2000 UT) capsule Take 1 capsule (2,000 Units total) by mouth daily. 04/21/18  Yes Plotnikov, Evie Lacks, MD  desonide (DESOWEN) 0.05 % cream Apply 1 application topically daily as needed (dermatitis).  03/29/13  Yes [provider]  mirtazapine (REMERON) 15 MG tablet Take 1 tablet (15 mg total) by mouth at bedtime. 05/06/18  Yes  [name], Arlyce Harman, MD  Multiple Vitamin (MULTIVITAMIN WITH MINERALS) TABS Take 1 tablet by mouth every morning.   Yes [provider]  venlafaxine XR (EFFEXOR-XR) 75 MG 24 hr capsule Take 1 capsule (75 mg total) by mouth daily with breakfast. 05/06/18  Yes [name], Arlyce Harman, MD  Cholecalciferol (VITAMIN D3) 1.25 MG (50000 UT) CAPS Take 1 capsule by mouth once a week. Patient not taking: Reported on 05/10/2018 04/21/18   Plotnikov, Evie Lacks, MD  meclizine (ANTIVERT) 12.5 MG tablet Take 1 tablet (12.5 mg total) by mouth 3 (three) times daily as needed for up to 7 days for dizziness. 05/10/18 05/17/18  Janeece Fitting, PA-C    Family History Family History  Problem Relation Age of Onset  . Depression Mother   . Bipolar disorder Father   . Colon cancer Other   . Cholelithiasis Maternal Grandmother   . Diabetes Maternal Grandmother   . Prostate cancer Maternal Grandfather   . Colon polyps Maternal Grandfather   . Esophageal cancer Neg Hx   . Rectal cancer Neg Hx   . Stomach cancer Neg Hx     Social History Social History   Tobacco Use  . Smoking status: Never Smoker  . Smokeless tobacco: Never Used  Substance Use Topics  . Alcohol use: No    Alcohol/week: 0.0 standard drinks  . Drug use: No     Allergies   Honey bee treatment [bee venom]; Doxycycline; Morphine; Quetiapine; Penicillins; Sulfa antibiotics; and Sulfamethoxazole   Review of Systems Review of Systems  Constitutional: Positive for fatigue. Negative for chills and fever.  HENT: Negative for ear pain and sore throat.   Eyes: Negative for pain and visual disturbance.  Respiratory: Negative for cough and shortness of breath.   Cardiovascular: Negative for chest pain and palpitations.  Gastrointestinal: Positive for nausea. Negative for abdominal pain, diarrhea and vomiting.  Genitourinary: Negative for dysuria and hematuria.  Musculoskeletal: Negative for arthralgias and back pain.  Skin: Negative for color change and  rash.  Neurological: Positive for dizziness. Negative for seizures and syncope.  All other systems reviewed and are negative.    Physical Exam Updated Vital Signs BP 120/76   Pulse 87   Temp 97.7 F (36.5 C) (Oral)   Resp 20   Ht 5\' 6"  (1.676 m)   Wt 96.6 kg   SpO2 97%   BMI 34.38 kg/m   Physical Exam Vitals signs and nursing note reviewed.  Constitutional:      Appearance: He is well-developed.  HENT:     Head: Normocephalic and atraumatic.  Eyes:     General: No scleral icterus.    Extraocular Movements:     Right eye: Nystagmus present.     Left eye: Nystagmus present.     Pupils: Pupils are equal, round, and reactive to light.     Comments: Horizontal nystagmus present on exam.   Neck:     Musculoskeletal: Normal range of motion.  Cardiovascular:     Heart sounds: Normal heart sounds.  Pulmonary:  Effort: Pulmonary effort is normal.     Breath sounds: Normal breath sounds. No wheezing.  Chest:     Chest wall: No tenderness.  Abdominal:     General: Bowel sounds are normal. There is no distension.     Palpations: Abdomen is soft.     Tenderness: There is no abdominal tenderness.  Musculoskeletal:        General: No tenderness or deformity.  Skin:    General: Skin is warm and dry.  Neurological:     Mental Status: He is alert and oriented to person, place, and time.     Comments: Alert, oriented, thought content appropriate. Speech fluent without evidence of aphasia. Able to follow 2 step commands without difficulty.  Cranial Nerves:  II:  Peripheral visual fields grossly normal, pupils, round, reactive to light III,IV, VI: ptosis not present, extra-ocular motions intact bilaterally  V,VII: smile symmetric, facial light touch sensation equal VIII: hearing grossly normal bilaterally  IX,X: midline uvula rise  XI: bilateral shoulder shrug equal and strong XII: midline tongue extension  Motor:  5/5 in upper and lower extremities bilaterally including  strong and equal grip strength and dorsiflexion/plantar flexion Sensory: light touch normal in all extremities.  Cerebellar: normal finger-to-nose with bilateral upper extremities, pronator drift negative Gait: normal gait and balance       ED Treatments / Results  Labs (all labs ordered are listed, but only abnormal results are displayed) Labs Reviewed  COMPREHENSIVE METABOLIC PANEL - Abnormal; Notable for the following components:      Result Value   Glucose, Bld 121 (*)    All other components within normal limits  URINALYSIS, ROUTINE W REFLEX MICROSCOPIC - Abnormal; Notable for the following components:   Color, Urine STRAW (*)    pH 9.0 (*)    All other components within normal limits  CBC WITH DIFFERENTIAL/PLATELET    EKG None  Radiology No results found.  Procedures Procedures (including critical care time)  Medications Ordered in ED Medications  meclizine (ANTIVERT) tablet 25 mg (25 mg Oral Given 05/10/18 0838)  sodium chloride 0.9 % bolus 1,000 mL (1,000 mLs Intravenous New Bag/Given 05/10/18 0851)     Initial Impression / Assessment and Plan / ED Course  I have reviewed the triage vital signs and the nursing notes.  Pertinent labs & imaging results that were available during my care of the patient were reviewed by me and considered in my medical decision making (see chart for details).      Patient with extensive psychiatric history currently on Remeron, Abilify, Venlafaxine presents to the ED with a chief complaint of dizziness.  In by his PCP recently approximately 1 week ago had normal EKG, chest x-ray, laboratory results aside from low vitamin D, he was placed on vitamin D reports since then his symptoms started, has decreased his dose of vitamin D and states dizziness continues.  States the dizziness is worse with movement along with turning of his head. Patient was told by his physician that an of his psychiatric medications will be causing his dizziness.   During evaluation patient has horizontal nystagmus present, will obtain blood work along with provide with fluids and Antivert to help with symptoms.  9:46 AM Patient reports improvement in symptoms with antivert, states while lying down the dizziness is at [place]. He was provided with bolus, orthostatics were reassuring.CMP showed no electrolyte abnormality, creatine level is unremarkable along with LFTs. CBC showed no leukocytosis, hemoglobin is normal today from his previous  being elevated. UA showed no nitrites, leukocytes, no WBCs he reports increased urination but state he has been drinking a lot of fluid lately.   EKG NSR, no QTc prolongation, no changes consistent with infarct. Neuro exam was unremarkable, low suspicion for any CNS abnormality.  No further imaging was warranted at this time.  Patient will be given Antivert to take at home and follow-up with PCP is encouraged.  Has an appointment already established with hematologist due to elevated hemoglobin during his last PCP visit.  He was informed that on today's lab work his hemoglobin was normal.  Vital signs stable, blood pressure is unremarkable at 120/60.  At this time will discharge patient with Antivert to help with his likely vertigo.  Return precautions provided to patient and mother at the bedside.     Final Clinical Impressions(s) / ED Diagnoses   Final diagnoses:  Dizziness    ED Discharge Orders         Ordered    meclizine (ANTIVERT) 12.5 MG tablet  3 times daily PRN     05/10/18 0954           Janeece Fitting, PA-C 05/10/18 0956    Tegeler, Gwenyth Allegra, MD 05/10/18 [phone]

## 2018-05-10 NOTE — ED Triage Notes (Signed)
Patient presents with dizziness, starting approximately 3 weeks ago. Patient reports standing up, exercising, and driving make his dizziness worse. Patient reports seeing his PCP and had a "normal EKG and normal lab work." Patient reports feeling "a constant pressure headache." Patient denies nausea/vomiting. Patient reports a history of taking anti-psychotic medication such as abilify, Remeron, and effexor, and his PCP mentioned dizziness is possible side effect of these medications, but patient reports taking them for "years" and just now is experiencing symptoms. Patient also reports "frequent urination, maybe every hour. Its clear." Patient and mother very anxious in triage.

## 2018-05-21 ENCOUNTER — Ambulatory Visit: Payer: Self-pay | Admitting: Hematology

## 2018-05-21 ENCOUNTER — Inpatient Hospital Stay: Payer: Medicare Other

## 2018-08-06 ENCOUNTER — Other Ambulatory Visit: Payer: Self-pay

## 2018-08-06 ENCOUNTER — Encounter (HOSPITAL_COMMUNITY): Payer: Self-pay | Admitting: Psychiatry

## 2018-08-06 ENCOUNTER — Ambulatory Visit (INDEPENDENT_AMBULATORY_CARE_PROVIDER_SITE_OTHER): Payer: Medicare Other | Admitting: Psychiatry

## 2018-08-06 DIAGNOSIS — F411 Generalized anxiety disorder: Secondary | ICD-10-CM

## 2018-08-06 DIAGNOSIS — F429 Obsessive-compulsive disorder, unspecified: Secondary | ICD-10-CM | POA: Diagnosis not present

## 2018-08-06 MED ORDER — VENLAFAXINE HCL ER 75 MG PO CP24: 75.0000 mg | ORAL_CAPSULE | Freq: Every day | ORAL | 0 refills | Status: DC

## 2018-08-06 MED ORDER — MIRTAZAPINE 15 MG PO TABS: 15.0000 mg | ORAL_TABLET | Freq: Every day | ORAL | 0 refills | Status: DC

## 2018-08-06 MED ORDER — ARIPIPRAZOLE 5 MG PO TABS: 5.0000 mg | ORAL_TABLET | Freq: Every day | ORAL | 0 refills | Status: DC

## 2018-08-06 NOTE — Progress Notes (Signed)
Virtual Visit via Telephone Note  I connected with Bruce Ellison on 08/06/18 at  3:00 PM EDT by telephone and verified that I am speaking with the correct person using two identifiers.   I discussed the limitations, risks, security and privacy concerns of performing an evaluation and management service by telephone and the availability of in person appointments. I also discussed with the patient that there may be a patient responsible charge related to this service. The patient expressed understanding and agreed to proceed.   History of Present Illness: Patient was evaluated by phone session.  He reported his OCD and anxiety is stable.  He still have sometimes rituals when he taps his foot but denies any obsession about washing and checking.  He admitted few pounds gain because not able to go to gym.  He has no longer dizziness and cramping as he is taking vitamin D and raking a lot of water.  Denies any agitation, irritability, anger or mania.  He does not want to change his medication as he feels the medicine is working very well.  He lives with his mother and stepfather.  He has mild tremors but it is chronic and does not interfere in his daily activities.  He denies any suicidal thoughts or homicidal thought.  His energy level is okay.  He is sleeping good.  Denies drinking or using any illegal substances.   Past Psychiatric History: Reviewed. H/O anxiety, Ticand depression since age 48. Received treatment in [place]. Tried Geodon, Risperdal,Zoloft, Valium, Klonopin, Xanax, Prozac, Ambien, and Seroquel. Saw psychiatrist at [facility] and then in [place] and [place]. Tried Luvox but made him more irritable. No history of suicidal attempt.   Psychiatric Specialty Exam: Physical Exam  ROS  There were no vitals taken for this visit.There is no height or weight on file to calculate BMI.  General Appearance: NA  Eye Contact:  NA  Speech:  Clear and Coherent  Volume:  Normal   Mood:  Anxious  Affect:  NA  Thought Process:  Goal Directed  Orientation:  Full (Time, Place, and Person)  Thought Content:  Logical  Suicidal Thoughts:  No  Homicidal Thoughts:  No  Memory:  Immediate;   Good Recent;   Good Remote;   Good  Judgement:  Good  Insight:  Good  Psychomotor Activity:  NA  Concentration:  Concentration: Fair and Attention Span: Fair  Recall:  Good  Fund of Knowledge:  Good  Language:  Good  Akathisia:  NA  Handed:  Right  AIMS (if indicated):     Assets:  Communication Skills Desire for Improvement Housing Resilience Social Support  ADL's:  Intact  Cognition:  WNL  Sleep:   good      Assessment and Plan: Generalized anxiety disorder.  Obsessive-compulsive disorder.  Patient is a stable on his current medication.  He does not want to change his medication.  Continue Effexor 75 mg daily, Remeron 15 mg at bedtime and Abilify 5 mg daily.  He is not interested in therapy and does not want Cogentin as his tremors are not interfering in his daily activities.  Recommended to call us back if is any question or any concern.  Follow-up in 3 months.  Follow Up Instructions:    I discussed the assessment and treatment plan with the patient. The patient was provided an opportunity to ask questions and all were answered. The patient agreed with the plan and demonstrated an understanding of the instructions.  The patient was advised to call back or seek an in-person evaluation if the symptoms worsen or if the condition fails to improve as anticipated.  I provided 15 minutes of non-face-to-face time during this encounter.   Kathlee Nations, MD

## 2018-08-09 ENCOUNTER — Other Ambulatory Visit: Payer: Self-pay | Admitting: Internal Medicine

## 2018-08-09 DIAGNOSIS — F429 Obsessive-compulsive disorder, unspecified: Secondary | ICD-10-CM

## 2018-08-09 MED ORDER — HYDROCORTISONE ACETATE 25 MG RE SUPP: 25.0000 mg | Freq: Two times a day (BID) | RECTAL | 1 refills | Status: AC

## 2018-10-04 ENCOUNTER — Encounter: Payer: Self-pay | Admitting: Internal Medicine

## 2018-10-31 ENCOUNTER — Other Ambulatory Visit (HOSPITAL_COMMUNITY): Payer: Self-pay | Admitting: Psychiatry

## 2018-10-31 DIAGNOSIS — F429 Obsessive-compulsive disorder, unspecified: Secondary | ICD-10-CM

## 2018-10-31 DIAGNOSIS — F411 Generalized anxiety disorder: Secondary | ICD-10-CM

## 2018-11-06 ENCOUNTER — Encounter (HOSPITAL_COMMUNITY): Payer: Self-pay | Admitting: Psychiatry

## 2018-11-06 ENCOUNTER — Other Ambulatory Visit: Payer: Self-pay

## 2018-11-06 ENCOUNTER — Ambulatory Visit (INDEPENDENT_AMBULATORY_CARE_PROVIDER_SITE_OTHER): Payer: Medicare Other | Admitting: Psychiatry

## 2018-11-06 DIAGNOSIS — F411 Generalized anxiety disorder: Secondary | ICD-10-CM | POA: Diagnosis not present

## 2018-11-06 DIAGNOSIS — F429 Obsessive-compulsive disorder, unspecified: Secondary | ICD-10-CM

## 2018-11-06 MED ORDER — MIRTAZAPINE 15 MG PO TABS: 15.0000 mg | ORAL_TABLET | Freq: Every day | ORAL | 0 refills | Status: DC

## 2018-11-06 MED ORDER — ARIPIPRAZOLE 5 MG PO TABS: 5.0000 mg | ORAL_TABLET | Freq: Every day | ORAL | 0 refills | Status: DC

## 2018-11-06 MED ORDER — VENLAFAXINE HCL ER 75 MG PO CP24: 75.0000 mg | ORAL_CAPSULE | Freq: Every day | ORAL | 0 refills | Status: DC

## 2018-11-06 NOTE — Progress Notes (Signed)
Virtual Visit via Telephone Note  I connected with Bruce Ellison on 11/06/18 at  3:20 PM EDT by telephone and verified that I am speaking with the correct person using two identifiers.   I discussed the limitations, risks, security and privacy concerns of performing an evaluation and management service by telephone and the availability of in person appointments. I also discussed with the patient that there may be a patient responsible charge related to this service. The patient expressed understanding and agreed to proceed.   History of Present Illness: Patient was evaluated by phone session.  He reported his OCD and anxiety is chronic but stable.  He still wash his hand multiple times in the day.  He also taps his foot frequently but it is not getting worse or out of control.  He sleeps okay.  Sometimes he has nightmares but most of the time he sleeps well.  He regret gaining weight 10 pounds since the last visit because he is not walking as much and not watching his calorie intake.  He feels the current medicine is working.  He lives with his mother and stepfather.  He has mild tremors and shakes but he does not want to take Cogentin since it does not interfere in his daily activities.  He denies any feeling of hopelessness or worthlessness.  He denies any crying spells.  Energy level is fair.  He denies drinking or using any illegal substances.  He denies any panic attack.  He wants to continue current medication.   Past Psychiatric History:Reviewed. H/Oanxiety, Ticand depression since age 47. Received treatment in [place]. Tried Geodon, Risperdal,Zoloft, Valium, Klonopin, Xanax, Prozac,Ambien,and Seroquel. Saw psychiatrist at [facility] and then in [place] and [place]. Tried Luvox but made him more irritable. No history of suicidal attempt.   Psychiatric Specialty Exam: Physical Exam  ROS  There were no vitals taken for this visit.There is no height or weight on file  to calculate BMI.  General Appearance: NA  Eye Contact:  NA  Speech:  Clear and Coherent and Slow  Volume:  Normal  Mood:  Anxious  Affect:  NA  Thought Process:  Goal Directed  Orientation:  Full (Time, Place, and Person)  Thought Content:  Obsessions  Suicidal Thoughts:  No  Homicidal Thoughts:  No  Memory:  Immediate;   Good Recent;   Good Remote;   Good  Judgement:  Good  Insight:  Good  Psychomotor Activity:  NA  Concentration:  Concentration: Good and Attention Span: Good  Recall:  Good  Fund of Knowledge:  Good  Language:  Good  Akathisia:  Mild tremors  Handed:  Right  AIMS (if indicated):     Assets:  Communication Skills Desire for Improvement Housing Social Support Transportation  ADL's:  Intact  Cognition:  WNL  Sleep:   fair      Assessment and Plan: Generalized anxiety disorder.  Obsessive-compulsive disorder.  Patient is a stable on his current medication.  He does not want to change the dose or reduce the dose.  Discussed medication side effects and benefits.  He like to continue Effexor 75 mg daily, Remeron 15 mg at bedtime and Abilify 5 mg daily.  He is not interested in therapy.  Discussed medication side effects and benefits.  Recommended to call us back if he has any question or any concern.  Encourage healthy lifestyle and watch his calorie intake which she agree.  Follow-up in 3 months.  Follow Up Instructions:  I discussed the assessment and treatment plan with the patient. The patient was provided an opportunity to ask questions and all were answered. The patient agreed with the plan and demonstrated an understanding of the instructions.   The patient was advised to call back or seek an in-person evaluation if the symptoms worsen or if the condition fails to improve as anticipated.  I provided 20 minutes of non-face-to-face time during this encounter.   Kathlee Nations, MD

## 2018-12-20 ENCOUNTER — Ambulatory Visit: Payer: Medicare Other | Admitting: Internal Medicine

## 2018-12-20 ENCOUNTER — Encounter: Payer: Self-pay | Admitting: Family

## 2018-12-20 ENCOUNTER — Other Ambulatory Visit: Payer: Self-pay

## 2018-12-20 ENCOUNTER — Ambulatory Visit (INDEPENDENT_AMBULATORY_CARE_PROVIDER_SITE_OTHER): Payer: Medicare Other | Admitting: Family

## 2018-12-20 DIAGNOSIS — B370 Candidal stomatitis: Secondary | ICD-10-CM

## 2018-12-20 MED ORDER — FLUCONAZOLE 100 MG PO TABS: 100.0000 mg | ORAL_TABLET | Freq: Every day | ORAL | 0 refills | Status: DC

## 2018-12-20 NOTE — Progress Notes (Signed)
Bruce Ellison is a 38 y.o. male with the following history as recorded in EpicCare:  Patient Active Problem List   Diagnosis Date Noted  . Foot cramps 04/18/2018  . Lightheadedness 04/18/2018  . Rash of groin 06/29/2017  . Intertriginous candidiasis 06/13/2017  . Family history of malignant neoplasm of prostate 05/18/2017  . Renal cyst 05/16/2017  . Tachycardia 04/03/2017  . Varicella vaccination 10/13/2016  . Spider veins of both lower extremities 09/12/2016  . Acute pharyngitis 08/19/2015  . Hyperlipidemia 03/10/2015  . Nonallopathic lesion-rib cage 12/31/2013  . Nonallopathic lesion of lumbosacral region 12/31/2013  . Sleep difficulties 12/28/2013  . Diarrhea 10/10/2013  . Cervical disc disorder with radiculopathy of cervical region 06/11/2013  . Acute thoracic back pain 05/28/2013  . Muscle spasm of back 03/24/2013  . Nonallopathic lesion of thoracic region 03/24/2013  . Finger wound, simple, open 12/08/2012  . Abdominal distention 12/06/2012  . Sinusitis nasal 06/04/2012  . Hyperglycemia 06/04/2012  . Weight gain 06/04/2012  . Severe episode of recurrent major depressive disorder ([place]) 05/28/2012  . Generalized anxiety disorder 05/28/2012  . Varicose veins of lower extremities with other complications [id number]  . Sore throat 02/16/2012  . OSA (obstructive sleep apnea) 02/14/2011  . Polycythemia 12/14/2010  . Cerumen impaction 06/24/2010  . VITAMIN D DEFICIENCY 12/25/2008  . SEBORRHEIC DERMATITIS 07/23/2008  . FREQUENCY, URINARY 07/23/2008  . CELLULITIS/ABSCESS, FOOT 09/11/2007  . GLUCOSE INTOLERANCE 04/11/2007  . Allergic rhinitis 04/11/2007  . GERD 04/11/2007  . Backache 04/11/2007    Current Outpatient Medications  Medication Sig Dispense Refill  . ARIPiprazole (ABILIFY) 5 MG tablet Take 1 tablet (5 mg total) by mouth daily. 90 tablet 0  . Cholecalciferol (VITAMIN D3) 1.25 MG (50000 UT) CAPS Take 1 capsule by mouth once a week. (Patient not taking: Reported on  05/10/2018) 6 capsule 0  . Cholecalciferol (VITAMIN D3) 50 MCG (2000 UT) capsule Take 1 capsule (2,000 Units total) by mouth daily. 100 capsule 3  . desonide (DESOWEN) 0.05 % cream Apply 1 application topically daily as needed (dermatitis).     . fluconazole (DIFLUCAN) 100 MG tablet Take 1 tablet (100 mg total) by mouth daily. 10 tablet 0  . hydrocortisone (ANUSOL-HC) 25 MG suppository Place 1 suppository (25 mg total) rectally 2 (two) times daily. 20 suppository 1  . mirtazapine (REMERON) 15 MG tablet Take 1 tablet (15 mg total) by mouth at bedtime. 90 tablet 0  . Multiple Vitamin (MULTIVITAMIN WITH MINERALS) TABS Take 1 tablet by mouth every morning.    . venlafaxine XR (EFFEXOR-XR) 75 MG 24 hr capsule Take 1 capsule (75 mg total) by mouth daily with breakfast. 90 capsule 0   No current facility-administered medications for this visit.     Allergies: Honey bee treatment [bee venom], Doxycycline, Morphine, Quetiapine, Penicillins, Sulfa antibiotics, and Sulfamethoxazole  Past Medical History:  Diagnosis Date  . Adenomatous colon polyp    tubular  . Allergic rhinitis   . Anxiety   . Depression   . GERD (gastroesophageal reflux disease)   . Glucose intolerance (impaired glucose tolerance)   . HTN (hypertension)    borderline  . Insomnia   . Obesity   . OCD (obsessive compulsive disorder)   . Personality disorder ([place])    with schizoid features  . Sleep apnea   . Tourette's    per [facility]  . Varicose veins of both lower extremities   . Vitamin D deficiency     Past Surgical History:  Procedure Laterality  Date  . adnoidectomy  29    Family History  Problem Relation Age of Onset  . Depression Mother   . Bipolar disorder Father   . Colon cancer Other   . Cholelithiasis Maternal Grandmother   . Diabetes Maternal Grandmother   . Prostate cancer Maternal Grandfather   . Colon polyps Maternal Grandfather   . Esophageal cancer Neg Hx   . Rectal cancer Neg Hx   . Stomach  cancer Neg Hx     Social History   Tobacco Use  . Smoking status: Never Smoker  . Smokeless tobacco: Never Used  Substance Use Topics  . Alcohol use: No    Alcohol/week: 0.0 standard drinks    Subjective:    I connected with [name] on 12/20/18 at 10:00 AM EDT by a video enabled telemedicine application and verified that I am speaking with the correct person using two identifiers. Patient, his mother and I are on the video call. Provider in office/ patient is at home.   I discussed the limitations of evaluation and management by telemedicine and the availability of in person appointments. The patient expressed understanding and agreed to proceed.  1 day history of thrush on tongue; has had sore throat recently and is concerned that could have thrush in her throat. Mother also asking for treatment for thrush; has not taken any antibiotics recently.       Objective:  There were no vitals filed for this visit.  General: Well developed, well nourished, in no acute distress  Head: Normocephalic and atraumatic  Oropharynx: Pink, supple. No suspicious lesions- limited visualization of tongue on screen today Lungs: Respirations unlabored; Neurologic: Alert and oriented; speech intact; face symmetrical;   Assessment:  1. Oral thrush     Plan:  Rx for Diflucan 100 mg qd x 10 days; follow-up in office if symptoms persist.  No follow-ups on file.  No orders of the defined types were placed in this encounter.   Requested Prescriptions   Signed Prescriptions Disp Refills  . fluconazole (DIFLUCAN) 100 MG tablet 10 tablet 0    Sig: Take 1 tablet (100 mg total) by mouth daily.

## 2018-12-23 ENCOUNTER — Telehealth: Payer: Self-pay | Admitting: *Deleted

## 2018-12-23 ENCOUNTER — Other Ambulatory Visit: Payer: Self-pay | Admitting: Family

## 2018-12-23 ENCOUNTER — Ambulatory Visit: Payer: Medicare Other | Admitting: Family

## 2018-12-23 ENCOUNTER — Other Ambulatory Visit: Payer: Self-pay

## 2018-12-23 MED ORDER — NYSTATIN 100000 UNIT/ML MT SUSP: 5.0000 mL | Freq: Four times a day (QID) | OROMUCOSAL | 0 refills | Status: DC

## 2018-12-23 NOTE — Progress Notes (Unsigned)
Bruce Ellison is a 38 y.o. male with the following history as recorded in EpicCare:  Patient Active Problem List   Diagnosis Date Noted  . Foot cramps 04/18/2018  . Lightheadedness 04/18/2018  . Rash of groin 06/29/2017  . Intertriginous candidiasis 06/13/2017  . Family history of malignant neoplasm of prostate 05/18/2017  . Renal cyst 05/16/2017  . Tachycardia 04/03/2017  . Varicella vaccination 10/13/2016  . Spider veins of both lower extremities 09/12/2016  . Acute pharyngitis 08/19/2015  . Hyperlipidemia 03/10/2015  . Nonallopathic lesion-rib cage 12/31/2013  . Nonallopathic lesion of lumbosacral region 12/31/2013  . Sleep difficulties 12/28/2013  . Diarrhea 10/10/2013  . Cervical disc disorder with radiculopathy of cervical region 06/11/2013  . Acute thoracic back pain 05/28/2013  . Muscle spasm of back 03/24/2013  . Nonallopathic lesion of thoracic region 03/24/2013  . Finger wound, simple, open 12/08/2012  . Abdominal distention 12/06/2012  . Sinusitis nasal 06/04/2012  . Hyperglycemia 06/04/2012  . Weight gain 06/04/2012  . Severe episode of recurrent major depressive disorder ([place]) 05/28/2012  . Generalized anxiety disorder 05/28/2012  . Varicose veins of lower extremities with other complications [id number]  . Sore throat 02/16/2012  . OSA (obstructive sleep apnea) 02/14/2011  . Polycythemia 12/14/2010  . Cerumen impaction 06/24/2010  . VITAMIN D DEFICIENCY 12/25/2008  . SEBORRHEIC DERMATITIS 07/23/2008  . FREQUENCY, URINARY 07/23/2008  . CELLULITIS/ABSCESS, FOOT 09/11/2007  . GLUCOSE INTOLERANCE 04/11/2007  . Allergic rhinitis 04/11/2007  . GERD 04/11/2007  . Backache 04/11/2007    Current Outpatient Medications  Medication Sig Dispense Refill  . ARIPiprazole (ABILIFY) 5 MG tablet Take 1 tablet (5 mg total) by mouth daily. 90 tablet 0  . Cholecalciferol (VITAMIN D3) 1.25 MG (50000 UT) CAPS Take 1 capsule by mouth once a week. (Patient not taking: Reported on  05/10/2018) 6 capsule 0  . Cholecalciferol (VITAMIN D3) 50 MCG (2000 UT) capsule Take 1 capsule (2,000 Units total) by mouth daily. 100 capsule 3  . desonide (DESOWEN) 0.05 % cream Apply 1 application topically daily as needed (dermatitis).     . fluconazole (DIFLUCAN) 100 MG tablet Take 1 tablet (100 mg total) by mouth daily. 10 tablet 0  . hydrocortisone (ANUSOL-HC) 25 MG suppository Place 1 suppository (25 mg total) rectally 2 (two) times daily. 20 suppository 1  . mirtazapine (REMERON) 15 MG tablet Take 1 tablet (15 mg total) by mouth at bedtime. 90 tablet 0  . Multiple Vitamin (MULTIVITAMIN WITH MINERALS) TABS Take 1 tablet by mouth every morning.    . venlafaxine XR (EFFEXOR-XR) 75 MG 24 hr capsule Take 1 capsule (75 mg total) by mouth daily with breakfast. 90 capsule 0   No current facility-administered medications for this visit.     Allergies: Honey bee treatment [bee venom], Doxycycline, Morphine, Quetiapine, Penicillins, Sulfa antibiotics, and Sulfamethoxazole  Past Medical History:  Diagnosis Date  . Adenomatous colon polyp    tubular  . Allergic rhinitis   . Anxiety   . Depression   . GERD (gastroesophageal reflux disease)   . Glucose intolerance (impaired glucose tolerance)   . HTN (hypertension)    borderline  . Insomnia   . Obesity   . OCD (obsessive compulsive disorder)   . Personality disorder ([place])    with schizoid features  . Sleep apnea   . Tourette's    per [facility]  . Varicose veins of both lower extremities   . Vitamin D deficiency     Past Surgical History:  Procedure Laterality  Date  . adnoidectomy  33    Family History  Problem Relation Age of Onset  . Depression Mother   . Bipolar disorder Father   . Colon cancer Other   . Cholelithiasis Maternal Grandmother   . Diabetes Maternal Grandmother   . Prostate cancer Maternal Grandfather   . Colon polyps Maternal Grandfather   . Esophageal cancer Neg Hx   . Rectal cancer Neg Hx   . Stomach  cancer Neg Hx     Social History   Tobacco Use  . Smoking status: Never Smoker  . Smokeless tobacco: Never Used  Substance Use Topics  . Alcohol use: No    Alcohol/week: 0.0 standard drinks    Subjective:     I connected with Bruce Ellison on 12/23/18 at 10:40 AM EDT by a video enabled telemedicine application and verified that I am speaking with the correct person using two identifiers.   I discussed the limitations of evaluation and management by telemedicine and the availability of in person appointments. The patient expressed understanding and agreed to proceed.    Objective:  There were no vitals filed for this visit.  General: Well developed, well nourished, in no acute distress  Skin : Warm and dry.  Head: Normocephalic and atraumatic  Eyes: Sclera and conjunctiva clear; pupils round and reactive to light; extraocular movements intact  Ears: External normal; canals clear; tympanic membranes normal  Oropharynx: Pink, supple. No suspicious lesions  Neck: Supple without thyromegaly, adenopathy  Lungs: Respirations unlabored; clear to auscultation bilaterally without wheeze, rales, rhonchi  [employer] exam: {heart exam:315510::"normal rate, regular rhythm, normal S1, S2, no murmurs, rubs, clicks or gallops"}.  Abdomen: Soft; nontender; nondistended; normoactive bowel sounds; no masses or hepatosplenomegaly  Musculoskeletal: No deformities; no active joint inflammation  Extremities: No edema, cyanosis, clubbing  Vessels: Symmetric bilaterally  Neurologic: Alert and oriented; speech intact; face symmetrical; moves all extremities well; CNII-XII intact without focal deficit  Assessment:  No diagnosis found.  Plan:  ***   No follow-ups on file.  No orders of the defined types were placed in this encounter.   Requested Prescriptions    No prescriptions requested or ordered in this encounter

## 2018-12-23 NOTE — Telephone Encounter (Signed)
I called pt since he missed his virtual visit with Beverely Risen., NP today.  He c/o HA, nausea and diarrhea since starting fluconazole. He took his last dose on Saturday. He is requesting something else.

## 2019-02-05 ENCOUNTER — Ambulatory Visit (INDEPENDENT_AMBULATORY_CARE_PROVIDER_SITE_OTHER): Payer: Medicare Other | Admitting: Psychiatry

## 2019-02-05 ENCOUNTER — Encounter (HOSPITAL_COMMUNITY): Payer: Self-pay | Admitting: Psychiatry

## 2019-02-05 ENCOUNTER — Other Ambulatory Visit: Payer: Self-pay

## 2019-02-05 DIAGNOSIS — F429 Obsessive-compulsive disorder, unspecified: Secondary | ICD-10-CM | POA: Diagnosis not present

## 2019-02-05 DIAGNOSIS — F411 Generalized anxiety disorder: Secondary | ICD-10-CM

## 2019-02-05 MED ORDER — ARIPIPRAZOLE 5 MG PO TABS: 5.0000 mg | ORAL_TABLET | Freq: Every day | ORAL | 1 refills | Status: DC

## 2019-02-05 MED ORDER — VENLAFAXINE HCL ER 75 MG PO CP24: 75.0000 mg | ORAL_CAPSULE | Freq: Every day | ORAL | 1 refills | Status: DC

## 2019-02-05 MED ORDER — MIRTAZAPINE 15 MG PO TABS: 15.0000 mg | ORAL_TABLET | Freq: Every day | ORAL | 1 refills | Status: DC

## 2019-02-05 NOTE — Progress Notes (Signed)
Virtual Visit via Telephone Note  I connected with Bruce Ellison on 02/05/19 at  3:20 PM EST by telephone and verified that I am speaking with the correct person using two identifiers.   I discussed the limitations, risks, security and privacy concerns of performing an evaluation and management service by telephone and the availability of in person appointments. I also discussed with the patient that there may be a patient responsible charge related to this service. The patient expressed understanding and agreed to proceed.   History of Present Illness: Patient was evaluated by phone session.  He is doing well on his current medication.  He had a quite Thanksgiving.  Patient lives with his mother.  His stepfather lives close by.  Patient reported his anxiety, OCD and depression is a stable on his medication.  He sleeps good.  He has mild tremors but does not interfere in his daily activities.  Denies any crying spells or any feeling of hopelessness or worthlessness.  He likes to keep his current medication.    Past Psychiatric History:Reviewed. H/Oanxiety, [place] depression since age 69. Received treatment in [place]. Tried Geodon, Risperdal,Zoloft, Valium, Klonopin, Xanax, Prozac,Ambien,and Seroquel. Saw psychiatrist at [facility] and then in [place] and [place]. Tried Luvox but made him more irritable. No history of suicidal attempt.   Psychiatric Specialty Exam: Physical Exam  ROS  Weight 217 lb (98.4 kg).Body mass index is 35.02 kg/m.  General Appearance: NA  Eye Contact:  NA  Speech:  Normal Rate  Volume:  Normal  Mood:  Euthymic  Affect:  NA  Thought Process:  Goal Directed  Orientation:  Full (Time, Place, and Person)  Thought Content:  WDL  Suicidal Thoughts:  No  Homicidal Thoughts:  No  Memory:  Immediate;   Good Recent;   Good Remote;   Good  Judgement:  Good  Insight:  Good  Psychomotor Activity:  NA  Concentration:  Concentration: Fair and  Attention Span: Fair  Recall:  Good  Fund of Knowledge:  Good  Language:  Good  Akathisia:  No  Handed:  Right  AIMS (if indicated):     Assets:  Communication Skills Desire for Improvement Housing Resilience Social Support  ADL's:  Intact  Cognition:  WNL  Sleep:   ok     Assessment and Plan: Generalized anxiety disorder.  Obsessive-compulsive disorder.  Patient is a stable on his current medication.  He does not want to change or reduce the dose.  Continue Remeron 15 mg at bedtime, Abilify 5 mg daily and Effexor 75 mg daily.  Discussed medication side effects and benefits.  Recommended to call us back if he has any question of any concern.  Follow-up in 6 months.  Follow Up Instructions:    I discussed the assessment and treatment plan with the patient. The patient was provided an opportunity to ask questions and all were answered. The patient agreed with the plan and demonstrated an understanding of the instructions.   The patient was advised to call back or seek an in-person evaluation if the symptoms worsen or if the condition fails to improve as anticipated.  I provided 20 minutes of non-face-to-face time during this encounter.   Kathlee Nations, MD

## 2019-05-27 ENCOUNTER — Ambulatory Visit (INDEPENDENT_AMBULATORY_CARE_PROVIDER_SITE_OTHER): Payer: Medicare Other | Admitting: Family

## 2019-05-27 DIAGNOSIS — J301 Allergic rhinitis due to pollen: Secondary | ICD-10-CM

## 2019-05-27 MED ORDER — FAMOTIDINE 20 MG PO TABS: 20.0000 mg | ORAL_TABLET | Freq: Two times a day (BID) | ORAL | 0 refills | Status: DC

## 2019-05-27 MED ORDER — CETIRIZINE HCL 10 MG PO TABS: 10.0000 mg | ORAL_TABLET | Freq: Every day | ORAL | 0 refills | Status: DC

## 2019-05-27 NOTE — Progress Notes (Signed)
Bruce Ellison is a 39 y.o. male with the following history as recorded in EpicCare:  Patient Active Problem List   Diagnosis Date Noted  . Foot cramps 04/18/2018  . Lightheadedness 04/18/2018  . Rash of groin 06/29/2017  . Intertriginous candidiasis 06/13/2017  . Family history of malignant neoplasm of prostate 05/18/2017  . Renal cyst 05/16/2017  . Tachycardia 04/03/2017  . Varicella vaccination 10/13/2016  . Spider veins of both lower extremities 09/12/2016  . Acute pharyngitis 08/19/2015  . Hyperlipidemia 03/10/2015  . Nonallopathic lesion-rib cage 12/31/2013  . Nonallopathic lesion of lumbosacral region 12/31/2013  . Sleep difficulties 12/28/2013  . Diarrhea 10/10/2013  . Cervical disc disorder with radiculopathy of cervical region 06/11/2013  . Acute thoracic back pain 05/28/2013  . Muscle spasm of back 03/24/2013  . Nonallopathic lesion of thoracic region 03/24/2013  . Finger wound, simple, open 12/08/2012  . Abdominal distention 12/06/2012  . Sinusitis nasal 06/04/2012  . Hyperglycemia 06/04/2012  . Weight gain 06/04/2012  . Severe episode of recurrent major depressive disorder ([place]) 05/28/2012  . Generalized anxiety disorder 05/28/2012  . Varicose veins of lower extremities with other complications [id number]  . Sore throat 02/16/2012  . OSA (obstructive sleep apnea) 02/14/2011  . Polycythemia 12/14/2010  . Cerumen impaction 06/24/2010  . VITAMIN D DEFICIENCY 12/25/2008  . SEBORRHEIC DERMATITIS 07/23/2008  . FREQUENCY, URINARY 07/23/2008  . CELLULITIS/ABSCESS, FOOT 09/11/2007  . GLUCOSE INTOLERANCE 04/11/2007  . Allergic rhinitis 04/11/2007  . GERD 04/11/2007  . Backache 04/11/2007    Current Outpatient Medications  Medication Sig Dispense Refill  . loratadine (CLARITIN) 10 MG tablet Take 10 mg by mouth daily.    . ARIPiprazole (ABILIFY) 5 MG tablet Take 1 tablet (5 mg total) by mouth daily. 90 tablet 1  . cetirizine (ZYRTEC) 10 MG tablet Take 1 tablet (10 mg  total) by mouth daily. 30 tablet 0  . Cholecalciferol (VITAMIN D3) 1.25 MG (50000 UT) CAPS Take 1 capsule by mouth once a week. (Patient not taking: Reported on 05/10/2018) 6 capsule 0  . Cholecalciferol (VITAMIN D3) 50 MCG (2000 UT) capsule Take 1 capsule (2,000 Units total) by mouth daily. 100 capsule 3  . desonide (DESOWEN) 0.05 % cream Apply 1 application topically daily as needed (dermatitis).     . famotidine (PEPCID) 20 MG tablet Take 1 tablet (20 mg total) by mouth 2 (two) times daily. 40 tablet 0  . hydrocortisone (ANUSOL-HC) 25 MG suppository Place 1 suppository (25 mg total) rectally 2 (two) times daily. 20 suppository 1  . mirtazapine (REMERON) 15 MG tablet Take 1 tablet (15 mg total) by mouth at bedtime. 90 tablet 1  . Multiple Vitamin (MULTIVITAMIN WITH MINERALS) TABS Take 1 tablet by mouth every morning.    . nystatin (MYCOSTATIN) 100000 UNIT/ML suspension Take 5 mLs (500,000 Units total) by mouth 4 (four) times daily. 120 mL 0  . venlafaxine XR (EFFEXOR-XR) 75 MG 24 hr capsule Take 1 capsule (75 mg total) by mouth daily with breakfast. 90 capsule 1   No current facility-administered medications for this visit.    Allergies: Honey bee treatment [bee venom], Doxycycline, Morphine, Quetiapine, Penicillins, Sulfa antibiotics, and Sulfamethoxazole  Past Medical History:  Diagnosis Date  . Adenomatous colon polyp    tubular  . Allergic rhinitis   . Anxiety   . Depression   . GERD (gastroesophageal reflux disease)   . Glucose intolerance (impaired glucose tolerance)   . HTN (hypertension)    borderline  . Insomnia   .  Obesity   . OCD (obsessive compulsive disorder)   . Personality disorder ([place])    with schizoid features  . Sleep apnea   . Tourette's    per [facility]  . Varicose veins of both lower extremities   . Vitamin D deficiency     Past Surgical History:  Procedure Laterality Date  . adnoidectomy  1992    Family History  Problem Relation Age of Onset  .  Depression Mother   . Bipolar disorder Father   . Colon cancer Other   . Cholelithiasis Maternal Grandmother   . Diabetes Maternal Grandmother   . Prostate cancer Maternal Grandfather   . Colon polyps Maternal Grandfather   . Esophageal cancer Neg Hx   . Rectal cancer Neg Hx   . Stomach cancer Neg Hx     Social History   Tobacco Use  . Smoking status: Never Smoker  . Smokeless tobacco: Never Used  Substance Use Topics  . Alcohol use: No    Alcohol/week: 0.0 standard drinks    Subjective:   I connected with [name] on 05/27/19 at 12:20 PM EDT by a telephone call and verified that I am speaking with the correct person using two identifiers.   I discussed the limitations of evaluation and management by telemedicine and the availability of in person appointments. The patient expressed understanding and agreed to proceed. Provider in office/ patient is at home; provider and patient are only 2 people on telephone call.   Feels that he has been having problems with his allergies- "notes that his face has been flushed." denies any new soaps, foods, detergents or medications. Has no concerns that his blood pressure has been elevated; symptoms were improved today; is prone to seasonal allergies- currently using Claritin; no shortness of breath or difficulty breathing; no concern for rash on trunk, extremities;  Per patient, he does not feel comfortable coming to office unless absolutely necessary and was unable to get his virtual visit to work successfully today;    Objective:  There were no vitals filed for this visit.  Lungs: Respirations unlabored;  Neurologic: Alert and oriented; speech intact;   Assessment:  1. Allergic rhinitis due to pollen, unspecified seasonality     Plan:  Hold Claritin- try using Zyrtec and Pepcid; he understands and agrees to in office visit if the symptoms persist; otherwise, he will follow-up as needed;  Time spent 12 minutes   No follow-ups on  file.  No orders of the defined types were placed in this encounter.   Requested Prescriptions   Signed Prescriptions Disp Refills  . cetirizine (ZYRTEC) 10 MG tablet 30 tablet 0    Sig: Take 1 tablet (10 mg total) by mouth daily.  . famotidine (PEPCID) 20 MG tablet 40 tablet 0    Sig: Take 1 tablet (20 mg total) by mouth 2 (two) times daily.

## 2019-06-09 DIAGNOSIS — N281 Cyst of kidney, acquired: Secondary | ICD-10-CM | POA: Diagnosis not present

## 2019-06-09 DIAGNOSIS — Z8042 Family history of malignant neoplasm of prostate: Secondary | ICD-10-CM | POA: Diagnosis not present

## 2019-07-31 ENCOUNTER — Other Ambulatory Visit: Payer: Self-pay

## 2019-07-31 ENCOUNTER — Encounter (HOSPITAL_COMMUNITY): Payer: Self-pay | Admitting: Psychiatry

## 2019-07-31 ENCOUNTER — Telehealth (INDEPENDENT_AMBULATORY_CARE_PROVIDER_SITE_OTHER): Payer: Medicare Other | Admitting: Psychiatry

## 2019-07-31 VITALS — Wt 225.0 lb

## 2019-07-31 DIAGNOSIS — F429 Obsessive-compulsive disorder, unspecified: Secondary | ICD-10-CM | POA: Diagnosis not present

## 2019-07-31 DIAGNOSIS — F411 Generalized anxiety disorder: Secondary | ICD-10-CM

## 2019-07-31 MED ORDER — ARIPIPRAZOLE 5 MG PO TABS: 5.0000 mg | ORAL_TABLET | Freq: Every day | ORAL | 1 refills | Status: DC

## 2019-07-31 MED ORDER — VENLAFAXINE HCL ER 75 MG PO CP24: 75.0000 mg | ORAL_CAPSULE | Freq: Every day | ORAL | 1 refills | Status: DC

## 2019-07-31 MED ORDER — TRAZODONE HCL 50 MG PO TABS: 50.0000 mg | ORAL_TABLET | Freq: Every evening | ORAL | 0 refills | Status: DC | PRN

## 2019-07-31 MED ORDER — MIRTAZAPINE 15 MG PO TABS: 15.0000 mg | ORAL_TABLET | Freq: Every day | ORAL | 1 refills | Status: DC

## 2019-07-31 NOTE — Progress Notes (Signed)
Virtual Visit via Telephone Note  I connected with Bruce Ellison on 07/31/19 at  3:00 PM EDT by telephone and verified that I am speaking with the correct person using two identifiers.   I discussed the limitations, risks, security and privacy concerns of performing an evaluation and management service by telephone and the availability of in person appointments. I also discussed with the patient that there may be a patient responsible charge related to this service. The patient expressed understanding and agreed to proceed.  Patient location; home Patient location; home office  History of Present Illness: Patient is evaluated by phone session.  He reported for past 1 month he has trouble sleeping.  He only sleeping 3 to 4 hours.  He also noticed sometimes feeling sad depressed and anxious but denies any suicidal thoughts or homicidal thoughts.  He is concerned about his weight.  He is trying to lose weight but so far he has not had good success.  He has mild tremors but does not interfere in his daily activities.  He denies any crying spells or any feeling of hopelessness or worthlessness.  He had tried Remeron higher doses but it caused urinary retention he does not want to go up on the dose.  He also had tried hydroxyzine, melatonin but that did not help him either.  He lives with his mother.  His stepfather lives close by.  He admitted his anxiety and OCD is there but stable and not worsening but his major issue is lack of sleep and racing thoughts.  Past Psychiatric History:Reviewed. H/Oanxiety, Ticand depression since age 36. Received treatment in [place]. Tried Geodon, Risperdal,Zoloft, Valium, Klonopin, Xanax, Prozac,Ambien,and Seroquel. Saw psychiatrist at [place] and then in [place] and [place]. Tried Luvox but made him more irritable. No history of suicidal attempt.   Psychiatric Specialty Exam: Physical Exam  Review of Systems  Weight 225 lb (102.1  kg).There is no height or weight on file to calculate BMI.  General Appearance: NA  Eye Contact:  NA  Speech:  Normal Rate  Volume:  Normal  Mood:  Anxious  Affect:  NA  Thought Process:  Goal Directed  Orientation:  Full (Time, Place, and Person)  Thought Content:  Obsessions  Suicidal Thoughts:  No  Homicidal Thoughts:  No  Memory:  Immediate;   Good Recent;   Good Remote;   Good  Judgement:  Intact  Insight:  Present  Psychomotor Activity:  NA  Concentration:  Concentration: Fair and Attention Span: Fair  Recall:  Good  Fund of Knowledge:  Good  Language:  NA  Akathisia:  mild tremors  Handed:  Right  AIMS (if indicated):     Assets:  Communication Skills Desire for Improvement Housing Resilience Social Support  ADL's:  Intact  Cognition:  WNL  Sleep:   fair. 3-4 hrs      Assessment and Plan: Generalized anxiety disorder.  Obsessive-compulsive disorder.  I reviewed his past history.  He had tried multiple medication including hydroxyzine, melatonin and increase Remeron to help sleep but so far did not help as much.  He has never tried trazodone I recommend to try low-dose trazodone however reminded it can cause dry mouth and has anticholinergic side effects and if he had any side effects and he can call us back.  I recommend to try 25 mg unless it does not work then he can take 50 mg.  Continue Abilify 5 mg daily, Effexor 75 mg daily and Remeron  15 mg at bedtime.  He is not interested in therapy.  Encouraged healthy lifestyle and watch his caloric intake.  Recommended to call us back if there is any question or any concern.  Follow-up in 6 months.  Follow Up Instructions:    I discussed the assessment and treatment plan with the patient. The patient was provided an opportunity to ask questions and all were answered. The patient agreed with the plan and demonstrated an understanding of the instructions.   The patient was advised to call back or seek an in-person  evaluation if the symptoms worsen or if the condition fails to improve as anticipated.  I provided 15 minutes of non-face-to-face time during this encounter.   Kathlee Nations, MD

## 2019-08-06 ENCOUNTER — Ambulatory Visit (HOSPITAL_COMMUNITY): Payer: Medicare Other | Admitting: Psychiatry

## 2019-09-17 ENCOUNTER — Ambulatory Visit: Payer: Medicare Other | Admitting: Internal Medicine

## 2019-10-30 ENCOUNTER — Telehealth: Payer: Medicare Other | Admitting: Nurse Practitioner

## 2019-10-30 DIAGNOSIS — J029 Acute pharyngitis, unspecified: Secondary | ICD-10-CM | POA: Diagnosis not present

## 2019-10-30 NOTE — Progress Notes (Signed)
We are sorry that you are not feeling well.  Here is how we plan to help!  Your symptoms indicate a likely viral infection (Pharyngitis).   Pharyngitis is inflammation in the back of the throat which can cause a sore throat, scratchiness and sometimes difficulty swallowing.   Pharyngitis is typically caused by a respiratory virus and will just run its course. It can sometimes involve blister like lesions on the throat  Please keep in mind that your symptoms could last up to 10 days.  For throat pain, we recommend over the counter oral pain relief medications such as acetaminophen or aspirin, or anti-inflammatory medications such as ibuprofen or naproxen sodium.  Topical treatments such as oral throat lozenges or sprays may be used as needed.  Avoid close contact with loved ones, especially the very young and elderly.  Remember to wash your hands thoroughly throughout the day as this is the number one way to prevent the spread of infection and wipe down door knobs and counters with disinfectant.  After careful review of your answers, I would not recommend and antibiotic for your condition.  Antibiotics should not be used to treat conditions that we suspect are caused by viruses like the virus that causes the common cold or flu. However, some people can have Strep with atypical symptoms. You may need formal testing in clinic or office to confirm if your symptoms continue or worsen.  Providers prescribe antibiotics to treat infections caused by bacteria. Antibiotics are very powerful in treating bacterial infections when they are used properly.  To maintain their effectiveness, they should be used only when necessary.  Overuse of antibiotics has resulted in the development of super bugs that are resistant to treatment!    Home Care:  Only take medications as instructed by your medical team.  Do not drink alcohol while taking these medications.  A steam or ultrasonic humidifier can help congestion.  You  can place a towel over your head and breathe in the steam from hot water coming from a faucet.  Avoid close contacts especially the very young and the elderly.  Cover your mouth when you cough or sneeze.  Always remember to wash your hands.  Get Help Right Away If:  You develop worsening fever or throat pain.  You develop a severe head ache or visual changes.  Your symptoms persist after you have completed your treatment plan.  Make sure you  Understand these instructions.  Will watch your condition.  Will get help right away if you are not doing well or get worse.  Your e-visit answers were reviewed by a board certified advanced clinical practitioner to complete your personal care plan.  Depending on the condition, your plan could have included both over the counter or prescription medications.  If there is a problem please reply  once you have received a response from your provider.  Your safety is important to [place].  If you have drug allergies check your prescription carefully.    You can use MyChart to ask questions about todays visit, request a non-urgent call back, or ask for a work or school excuse for 24 hours related to this e-Visit. If it has been greater than 24 hours you will need to follow up with your provider, or enter a new e-Visit to address those concerns.  You will get an e-mail in the next two days asking about your experience.  I hope that your e-visit has been valuable and will speed your recovery. Thank  you for using e-visits.   5-10 minutes spent reviewing and documenting in chart.

## 2019-12-11 ENCOUNTER — Other Ambulatory Visit: Payer: Self-pay

## 2019-12-11 ENCOUNTER — Ambulatory Visit (INDEPENDENT_AMBULATORY_CARE_PROVIDER_SITE_OTHER): Payer: Medicare Other | Admitting: Internal Medicine

## 2019-12-11 ENCOUNTER — Encounter: Payer: Self-pay | Admitting: Internal Medicine

## 2019-12-11 VITALS — BP 120/80 | HR 117 | Temp 98.5°F | Ht 66.0 in | Wt 216.0 lb

## 2019-12-11 DIAGNOSIS — E559 Vitamin D deficiency, unspecified: Secondary | ICD-10-CM

## 2019-12-11 DIAGNOSIS — D751 Secondary polycythemia: Secondary | ICD-10-CM | POA: Diagnosis not present

## 2019-12-11 DIAGNOSIS — E785 Hyperlipidemia, unspecified: Secondary | ICD-10-CM

## 2019-12-11 DIAGNOSIS — F332 Major depressive disorder, recurrent severe without psychotic features: Secondary | ICD-10-CM | POA: Diagnosis not present

## 2019-12-11 LAB — COMPREHENSIVE METABOLIC PANEL
ALT: 52 U/L (ref 0–53)
AST: 26 U/L (ref 0–37)
Albumin: 4.5 g/dL (ref 3.5–5.2)
Alkaline Phosphatase: 66 U/L (ref 39–117)
BUN: 11 mg/dL (ref 6–23)
CO2: 30 mEq/L (ref 19–32)
Calcium: 9.3 mg/dL (ref 8.4–10.5)
Chloride: 106 mEq/L (ref 96–112)
Creatinine, Ser: 0.96 mg/dL (ref 0.40–1.50)
GFR: 98.94 mL/min (ref >60.00)
Glucose, Bld: 128 mg/dL — ABNORMAL HIGH (ref 70–99)
Potassium: 4.1 mEq/L (ref 3.5–5.1)
Sodium: 140 mEq/L (ref 135–145)
Total Bilirubin: 0.9 mg/dL (ref 0.2–1.2)
Total Protein: 7.2 g/dL (ref 6.0–8.3)

## 2019-12-11 LAB — URINALYSIS
Bilirubin Urine: NEGATIVE
Hgb urine dipstick: NEGATIVE
Ketones, ur: NEGATIVE
Leukocytes,Ua: NEGATIVE
Nitrite: NEGATIVE
Specific Gravity, Urine: 1.02 (ref 1.000–1.030)
Total Protein, Urine: NEGATIVE
Urine Glucose: NEGATIVE
Urobilinogen, UA: 0.2 (ref 0.0–1.0)
pH: 7 (ref 5.0–8.0)

## 2019-12-11 LAB — CBC WITH DIFFERENTIAL/PLATELET
Basophils Absolute: 0 10*3/uL (ref 0.0–0.1)
Basophils Relative: 1 % (ref 0.0–3.0)
Eosinophils Absolute: 0.2 10*3/uL (ref 0.0–0.7)
Eosinophils Relative: 4.4 % (ref 0.0–5.0)
HCT: 49.2 % (ref 39.0–52.0)
Hemoglobin: 16.7 g/dL (ref 13.0–17.0)
Lymphocytes Relative: 27.8 % (ref 12.0–46.0)
Lymphs Abs: 1.2 10*3/uL (ref 0.7–4.0)
MCHC: 33.9 g/dL (ref 30.0–36.0)
MCV: 90.6 fl (ref 78.0–100.0)
Monocytes Absolute: 0.4 10*3/uL (ref 0.1–1.0)
Monocytes Relative: 9.6 % (ref 3.0–12.0)
Neutro Abs: 2.5 10*3/uL (ref 1.4–7.7)
Neutrophils Relative %: 57.2 % (ref 43.0–77.0)
Platelets: 242 10*3/uL (ref 150.0–400.0)
RBC: 5.43 Mil/uL (ref 4.22–5.81)
RDW: 13.5 % (ref 11.5–15.5)
WBC: 4.3 10*3/uL (ref 4.0–10.5)

## 2019-12-11 LAB — LIPID PANEL
Cholesterol: 169 mg/dL (ref 0–200)
HDL: 35.9 mg/dL — ABNORMAL LOW (ref >39.00)
LDL Cholesterol: 109 mg/dL — ABNORMAL HIGH (ref 0–99)
NonHDL: 133.27
Total CHOL/HDL Ratio: 5
Triglycerides: 120 mg/dL (ref 0.0–149.0)
VLDL: 24 mg/dL (ref 0.0–40.0)

## 2019-12-11 LAB — VITAMIN D 25 HYDROXY (VIT D DEFICIENCY, FRACTURES): VITD: 30.72 ng/mL (ref 30.00–100.00)

## 2019-12-11 LAB — TSH: TSH: 1.03 u[IU]/mL (ref 0.35–4.50)

## 2019-12-11 MED ORDER — LORATADINE 10 MG PO TABS: 10.0000 mg | ORAL_TABLET | Freq: Every day | ORAL | 3 refills | Status: DC

## 2019-12-11 MED ORDER — VITAMIN D3 50 MCG (2000 UT) PO CAPS: 2000.0000 [IU] | ORAL_CAPSULE | Freq: Every day | ORAL | 3 refills | Status: AC

## 2019-12-11 NOTE — Progress Notes (Signed)
Subjective:  Patient ID: Bruce Ellison, male    DOB: [birth year]  Age: 39 y.o. MRN: [id number]  CC: Annual Exam   HPI Bruce Ellison presents for OCD, depression, insomnia Pt was advised to get vaccinated w/JJ for COVID 19 due to obesity C/o fatigue  Outpatient Medications Prior to Visit  Medication Sig Dispense Refill  . ARIPiprazole (ABILIFY) 5 MG tablet Take 1 tablet (5 mg total) by mouth daily. 90 tablet 1  . cetirizine (ZYRTEC) 10 MG tablet Take 1 tablet (10 mg total) by mouth daily. 30 tablet 0  . Cholecalciferol (VITAMIN D3) 1.25 MG (50000 UT) CAPS Take 1 capsule by mouth once a week. 6 capsule 0  . Cholecalciferol (VITAMIN D3) 50 MCG (2000 UT) capsule Take 1 capsule (2,000 Units total) by mouth daily. 100 capsule 3  . desonide (DESOWEN) 0.05 % cream Apply 1 application topically daily as needed (dermatitis).     . famotidine (PEPCID) 20 MG tablet Take 1 tablet (20 mg total) by mouth 2 (two) times daily. 40 tablet 0  . loratadine (CLARITIN) 10 MG tablet Take 10 mg by mouth daily.    . mirtazapine (REMERON) 15 MG tablet Take 1 tablet (15 mg total) by mouth at bedtime. 90 tablet 1  . Multiple Vitamin (MULTIVITAMIN WITH MINERALS) TABS Take 1 tablet by mouth every morning.    . nystatin (MYCOSTATIN) 100000 UNIT/ML suspension Take 5 mLs (500,000 Units total) by mouth 4 (four) times daily. 120 mL 0  . traZODone (DESYREL) 50 MG tablet Take 1 tablet (50 mg total) by mouth at bedtime as needed for sleep. 90 tablet 0  . venlafaxine XR (EFFEXOR-XR) 75 MG 24 hr capsule Take 1 capsule (75 mg total) by mouth daily with breakfast. 90 capsule 1   No facility-administered medications prior to visit.    ROS: Review of Systems  Constitutional: Negative for appetite change, fatigue and unexpected weight change.  HENT: Negative for congestion, nosebleeds, sneezing, sore throat and trouble swallowing.   Eyes: Negative for itching and visual disturbance.  Respiratory: Negative for cough.    Cardiovascular: Negative for chest pain, palpitations and leg swelling.  Gastrointestinal: Negative for abdominal distention, blood in stool, diarrhea and nausea.  Genitourinary: Negative for frequency and hematuria.  Musculoskeletal: Negative for back pain, gait problem, joint swelling and neck pain.  Skin: Negative for rash.  Neurological: Negative for dizziness, tremors, speech difficulty and weakness.  Psychiatric/Behavioral: Positive for decreased concentration and dysphoric mood. Negative for agitation, sleep disturbance and suicidal ideas. The patient is not nervous/anxious.     Objective:  There were no vitals taken for this visit.  BP Readings from Last 3 Encounters:  05/10/18 125/76  05/03/18 120/70  04/18/18 116/68    Wt Readings from Last 3 Encounters:  05/10/18 213 lb (96.6 kg)  05/03/18 212 lb 1.3 oz (96.2 kg)  04/18/18 214 lb (97.1 kg)    Physical Exam Constitutional:      General: He is not in acute distress.    Appearance: He is well-developed. He is obese.     Comments: NAD  Eyes:     Conjunctiva/sclera: Conjunctivae normal.     Pupils: Pupils are equal, round, and reactive to light.  Neck:     Thyroid: No thyromegaly.     Vascular: No JVD.  Cardiovascular:     Rate and Rhythm: Normal rate and regular rhythm.     Heart sounds: Normal heart sounds. No murmur heard.  No friction rub. No gallop.  Pulmonary:     Effort: Pulmonary effort is normal. No respiratory distress.     Breath sounds: Normal breath sounds. No wheezing or rales.  Chest:     Chest wall: No tenderness.  Abdominal:     General: Bowel sounds are normal. There is no distension.     Palpations: Abdomen is soft. There is no mass.     Tenderness: There is no abdominal tenderness. There is no guarding or rebound.  Musculoskeletal:        General: No tenderness. Normal range of motion.     Cervical back: Normal range of motion.  Lymphadenopathy:     Cervical: No cervical adenopathy.   Skin:    General: Skin is warm and dry.     Findings: No rash.  Neurological:     Mental Status: He is alert and oriented to person, place, and time.     Cranial Nerves: No cranial nerve deficit.     Motor: No abnormal muscle tone.     Coordination: Coordination normal.     Gait: Gait normal.     Deep Tendon Reflexes: Reflexes are normal and symmetric.  Psychiatric:        Behavior: Behavior normal.        Thought Content: Thought content normal.        Judgment: Judgment normal.     Lab Results  Component Value Date   WBC 4.1 05/10/2018   HGB 16.7 05/10/2018   HCT 51.2 05/10/2018   PLT 219 05/10/2018   GLUCOSE 121 (H) 05/10/2018   CHOL 193 03/10/2015   TRIG 104.0 03/10/2015   HDL 42.40 03/10/2015   LDLDIRECT 136.9 08/30/2010   LDLCALC 130 (H) 03/10/2015   ALT 38 05/10/2018   AST 27 05/10/2018   NA 137 05/10/2018   K 3.8 05/10/2018   CL 105 05/10/2018   CREATININE 0.94 05/10/2018   BUN 14 05/10/2018   CO2 25 05/10/2018   TSH 1.50 04/18/2018   PSA 0.65 03/10/2015   HGBA1C 5.4 03/26/2018    No results found.  Assessment & Plan:    Walker Kehr, MD

## 2019-12-11 NOTE — Patient Instructions (Signed)
[facility] Mushroom 7 1,500 mg., 180 Vegetarian Capsules 

## 2019-12-11 NOTE — Assessment & Plan Note (Addendum)
F/u Dr Anne Hahn is on disability Abilify, Mirtazapine, Effexor

## 2019-12-11 NOTE — Assessment & Plan Note (Signed)
Labs Vit D 

## 2019-12-11 NOTE — Assessment & Plan Note (Signed)
CBC

## 2020-01-24 ENCOUNTER — Other Ambulatory Visit (HOSPITAL_COMMUNITY): Payer: Self-pay | Admitting: Psychiatry

## 2020-01-24 DIAGNOSIS — F429 Obsessive-compulsive disorder, unspecified: Secondary | ICD-10-CM

## 2020-01-24 DIAGNOSIS — F411 Generalized anxiety disorder: Secondary | ICD-10-CM

## 2020-02-02 ENCOUNTER — Ambulatory Visit (INDEPENDENT_AMBULATORY_CARE_PROVIDER_SITE_OTHER): Payer: Medicare Other | Admitting: Psychiatry

## 2020-02-02 ENCOUNTER — Other Ambulatory Visit: Payer: Self-pay

## 2020-02-02 ENCOUNTER — Encounter (HOSPITAL_COMMUNITY): Payer: Self-pay | Admitting: Psychiatry

## 2020-02-02 DIAGNOSIS — F429 Obsessive-compulsive disorder, unspecified: Secondary | ICD-10-CM | POA: Diagnosis not present

## 2020-02-02 DIAGNOSIS — F411 Generalized anxiety disorder: Secondary | ICD-10-CM

## 2020-02-02 MED ORDER — ARIPIPRAZOLE 5 MG PO TABS: 5.0000 mg | ORAL_TABLET | Freq: Every day | ORAL | 1 refills | Status: DC

## 2020-02-02 MED ORDER — MIRTAZAPINE 15 MG PO TABS: 15.0000 mg | ORAL_TABLET | Freq: Every day | ORAL | 1 refills | Status: DC

## 2020-02-02 MED ORDER — VENLAFAXINE HCL ER 75 MG PO CP24: 75.0000 mg | ORAL_CAPSULE | Freq: Every day | ORAL | 1 refills | Status: DC

## 2020-02-02 NOTE — Progress Notes (Signed)
Virtual Visit via Telephone Note  I connected with Bruce Ellison on 02/02/20 at  3:00 PM EST by telephone and verified that I am speaking with the correct person using two identifiers.  Location: Patient: Home Provider: Home Office   I discussed the limitations, risks, security and privacy concerns of performing an evaluation and management service by telephone and the availability of in person appointments. I also discussed with the patient that there may be a patient responsible charge related to this service. The patient expressed understanding and agreed to proceed.   History of Present Illness: Patient is evaluated by phone session.  He recently had blood work.  He has been doing well but for past 2 months he noticed having cold feet during the day.  Otherwise he has no concern.  He is not sure why he is having cold feet but recall his PCP increased his vitamin D dose.  He is taking 3000 mg instead of 2000.  His vitamin D was 30.2.  He denies any worsening of anxiety, crying spells, insomnia.  He stopped taking trazodone after 1 dose because he felt headache next day.  He does not feel he need trazodone because his sleep is okay.  He still have chronic obsessive thoughts and checking but also reported they are stable.  He has lost weight since the last visit.  He lives with his mother.  He has no tremors, shakes or any EPS.  He does not reported any other concern or situation that may have trigger the anxiety.  Past Psychiatric History: H/Oanxiety, Ticand depression since age 74. Received treatment in [place]. Tried Geodon, Risperdal,Zoloft, Valium, Klonopin, Xanax, Prozac,Ambien,and Seroquel. Saw psychiatrist at [place] and then in [place] and [place]. Tried Luvox but made him more irritable. No history of suicidal attempt.  Recent Results (from the past 2160 hour(s))  Comprehensive metabolic panel     Status: Abnormal   Collection Time: 12/11/19  8:17 AM   Result Value Ref Range   Sodium 140 135 - 145 mEq/L   Potassium 4.1 3.5 - 5.1 mEq/L   Chloride 106 96 - 112 mEq/L   CO2 30 19 - 32 mEq/L   Glucose, Bld 128 (H) 70 - 99 mg/dL   BUN 11 6 - 23 mg/dL   Creatinine, Ser 0.96 0.40 - 1.50 mg/dL   Total Bilirubin 0.9 0.2 - 1.2 mg/dL   Alkaline Phosphatase 66 39 - 117 U/L   AST 26 0 - 37 U/L   ALT 52 0 - 53 U/L   Total Protein 7.2 6.0 - 8.3 g/dL   Albumin 4.5 3.5 - 5.2 g/dL   GFR 98.94 >60.00 mL/min   Calcium 9.3 8.4 - 10.5 mg/dL  VITAMIN D 25 Hydroxy (Vit-D Deficiency, Fractures)     Status: None   Collection Time: 12/11/19  8:17 AM  Result Value Ref Range   VITD 30.72 30.00 - 100.00 ng/mL  Lipid panel     Status: Abnormal   Collection Time: 12/11/19  8:17 AM  Result Value Ref Range   Cholesterol 169 0 - 200 mg/dL    Comment: ATP III Classification       Desirable:  < 200 mg/dL               Borderline High:  200 - 239 mg/dL          High:  > = 240 mg/dL   Triglycerides 120.0 0 - 149 mg/dL    Comment: Normal:  <  150 mg/dLBorderline High:  150 - 199 mg/dL   HDL 35.90 (L) >39.00 mg/dL   VLDL 24.0 0.0 - 40.0 mg/dL   LDL Cholesterol 109 (H) 0 - 99 mg/dL   Total CHOL/HDL Ratio 5     Comment:                Men          Women1/2 Average Risk     3.4          3.3Average Risk          5.0          4.42X Average Risk          9.6          7.13X Average Risk          15.0          11.0                       NonHDL 133.27     Comment: NOTE:  Non-HDL goal should be 30 mg/dL higher than patient's LDL goal (i.e. LDL goal of < 70 mg/dL, would have non-HDL goal of < 100 mg/dL)  CBC with Differential/Platelet     Status: None   Collection Time: 12/11/19  8:17 AM  Result Value Ref Range   WBC 4.3 4.0 - 10.5 K/uL   RBC 5.43 4.22 - 5.81 Mil/uL   Hemoglobin 16.7 13.0 - 17.0 g/dL   HCT 49.2 39 - 52 %   MCV 90.6 78.0 - 100.0 fl   MCHC 33.9 30.0 - 36.0 g/dL   RDW 13.5 11.5 - 15.5 %   Platelets 242.0 150 - 400 K/uL   Neutrophils Relative % 57.2 43 - 77 %    Lymphocytes Relative 27.8 12 - 46 %   Monocytes Relative 9.6 3 - 12 %   Eosinophils Relative 4.4 0 - 5 %   Basophils Relative 1.0 0 - 3 %   Neutro Abs 2.5 1.4 - 7.7 K/uL   Lymphs Abs 1.2 0.7 - 4.0 K/uL   Monocytes Absolute 0.4 0.1 - 1.0 K/uL   Eosinophils Absolute 0.2 0.0 - 0.7 K/uL   Basophils Absolute 0.0 0.0 - 0.1 K/uL  Urinalysis     Status: None   Collection Time: 12/11/19  8:17 AM  Result Value Ref Range   Color, Urine YELLOW Yellow;Lt. Yellow;Straw;Dark Yellow;Amber;Green;Red;Brown   APPearance CLEAR Clear;Turbid;Slightly Cloudy;Cloudy   Specific Gravity, Urine 1.020 1.000 - 1.030   pH 7.0 5.0 - 8.0   Total Protein, Urine NEGATIVE Negative   Urine Glucose NEGATIVE Negative   Ketones, ur NEGATIVE Negative   Bilirubin Urine NEGATIVE Negative   Hgb urine dipstick NEGATIVE Negative   Urobilinogen, UA 0.2 0.0 - 1.0   Leukocytes,Ua NEGATIVE Negative   Nitrite NEGATIVE Negative  TSH     Status: None   Collection Time: 12/11/19  8:17 AM  Result Value Ref Range   TSH 1.03 0.35 - 4.50 uIU/mL    Psychiatric Specialty Exam: Physical Exam  Review of Systems  Weight 215 lb (97.5 kg).There is no height or weight on file to calculate BMI.  General Appearance: NA  Eye Contact:  NA  Speech:  Slow  Volume:  Normal  Mood:  Anxious  Affect:  NA  Thought Process:  Descriptions of Associations: Intact  Orientation:  Full (Time, Place, and Person)  Thought Content:  Obsessions and checking  Suicidal Thoughts:  No  Homicidal Thoughts:  No  Memory:  Immediate;   Good Recent;   Good Remote;   Good  Judgement:  Intact  Insight:  Present  Psychomotor Activity:  NA  Concentration:  Concentration: Fair and Attention Span: Fair  Recall:  Good  Fund of Knowledge:  Good  Language:  Good  Akathisia:  No  Handed:  Right  AIMS (if indicated):     Assets:  Communication Skills Desire for Improvement Housing Social Support  ADL's:  Intact  Cognition:  WNL  Sleep:   7 hours.       Assessment and Plan: Generalized anxiety disorder.  Obsessive-compulsive disorder.  I reviewed his blood work results.  He had stopped taking trazodone after having headache taking the first dose.  We discussed having cold feet but patient do not feel worsening of anxiety.  The only change he had done is increased dose of vitamin D prescribed by PCP.  I recommend that he can try to cut down the dose of vitamin D for 1 week to see if symptoms go away and if that does not help then he should call his PCP for further recommendation.  Patient does not want to change the medication since he feels his anxiety and obsessive thoughts are stable.  I will continue Abilify 5 mg daily, Effexor 75 mg daily and mirtazapine 15 mg at bedtime.  He is not interested in therapy.  Higher dose of mirtazapine has caused dry mouth and anticholinergic side effects.  I recommend to call us back if is any question or any concern.  I reviewed blood work results.  Follow-up in 6 months.  Follow Up Instructions:    I discussed the assessment and treatment plan with the patient. The patient was provided an opportunity to ask questions and all were answered. The patient agreed with the plan and demonstrated an understanding of the instructions.   The patient was advised to call back or seek an in-person evaluation if the symptoms worsen or if the condition fails to improve as anticipated.  I provided 18 minutes of non-face-to-face time during this encounter.   Kathlee Nations, MD

## 2020-06-30 ENCOUNTER — Other Ambulatory Visit: Payer: Self-pay | Admitting: Dermatology

## 2020-07-05 ENCOUNTER — Other Ambulatory Visit: Payer: Self-pay | Admitting: Internal Medicine

## 2020-07-05 MED ORDER — DESONIDE 0.05 % EX CREA: 1.0000 "application " | TOPICAL_CREAM | Freq: Every day | CUTANEOUS | 1 refills | Status: DC | PRN

## 2020-07-22 ENCOUNTER — Other Ambulatory Visit (HOSPITAL_COMMUNITY): Payer: Self-pay | Admitting: Psychiatry

## 2020-07-22 DIAGNOSIS — F411 Generalized anxiety disorder: Secondary | ICD-10-CM

## 2020-07-22 DIAGNOSIS — F429 Obsessive-compulsive disorder, unspecified: Secondary | ICD-10-CM

## 2020-07-26 ENCOUNTER — Telehealth (INDEPENDENT_AMBULATORY_CARE_PROVIDER_SITE_OTHER): Payer: Medicare Other | Admitting: Psychiatry

## 2020-07-26 ENCOUNTER — Other Ambulatory Visit: Payer: Self-pay

## 2020-07-26 ENCOUNTER — Encounter (HOSPITAL_COMMUNITY): Payer: Self-pay | Admitting: Psychiatry

## 2020-07-26 DIAGNOSIS — F429 Obsessive-compulsive disorder, unspecified: Secondary | ICD-10-CM

## 2020-07-26 DIAGNOSIS — F411 Generalized anxiety disorder: Secondary | ICD-10-CM | POA: Diagnosis not present

## 2020-07-26 MED ORDER — VENLAFAXINE HCL ER 75 MG PO CP24: 75.0000 mg | ORAL_CAPSULE | Freq: Every day | ORAL | 1 refills | Status: DC

## 2020-07-26 MED ORDER — MIRTAZAPINE 15 MG PO TABS: 15.0000 mg | ORAL_TABLET | Freq: Every day | ORAL | 1 refills | Status: DC

## 2020-07-26 MED ORDER — ARIPIPRAZOLE 5 MG PO TABS: 5.0000 mg | ORAL_TABLET | Freq: Every day | ORAL | 1 refills | Status: DC

## 2020-07-26 NOTE — Progress Notes (Signed)
Virtual Visit via Video Note  I connected with Bruce Ellison on 07/26/20 at  3:20 PM EDT by a video enabled telemedicine application and verified that I am speaking with the correct person using two identifiers.  Location: Patient: Home Provider: Office   I discussed the limitations of evaluation and management by telemedicine and the availability of in person appointments. The patient expressed understanding and agreed to proceed.  History of Present Illness: Patient is evaluated by video session.  He is stressed about his physical health.  He has chronic neuropathy pain and sometime he does not go outside for chronic pain.  For the same reason some nights he has difficulty sleeping.  He does not want to change the medication because he is afraid new medicine may cause side effects.  He has chronic obsessive thoughts which he described keep checking but they are stable.  He had gained weight from the past because not going outside due to pollens and not able to exercise because of chronic pain.  He has no tremors, shakes or any EPS.  He lives with his mother.  He tried to avoid crowded places which sometimes triggers anxiety.  He denies any paranoia, suicidal thoughts, hallucination.  Past Psychiatric History: H/Oanxiety, Ticand depression since age 89. Received treatment in [place]. Tried Geodon, Risperdal,Zoloft, Valium, Klonopin, Xanax, Prozac,Ambien,and Seroquel. Saw psychiatrist at [place] and then in [place] and [place]. Tried Luvox but made him more irritable. No history of suicidal attempt.   Psychiatric Specialty Exam: Physical Exam  Review of Systems  Weight 230 lb (104.3 kg).There is no height or weight on file to calculate BMI.  General Appearance: Casual  Eye Contact:  Fair  Speech:  Slow  Volume:  Decreased  Mood:  Dysphoric  Affect:  Constricted  Thought Process:  Goal Directed  Orientation:  Full (Time, Place, and Person)  Thought Content:   Rumination  Suicidal Thoughts:  No  Homicidal Thoughts:  No  Memory:  Immediate;   Good Recent;   Fair Remote;   Fair  Judgement:  Intact  Insight:  Shallow  Psychomotor Activity:  Decreased  Concentration:  Concentration: Fair and Attention Span: Fair  Recall:  [employer] of Knowledge:  Good  Language:  Fair  Akathisia:  No  Handed:  Right  AIMS (if indicated):     Assets:  Communication Skills Desire for Improvement Housing Resilience Social Support  ADL's:  Intact  Cognition:  WNL  Sleep:   fair      Assessment and Plan: Generalized anxiety disorder.  Obsessive-compulsive disorder.  I offered to try low-dose gabapentin but patient reluctant to try and refuse to add any more medication.  He is comfortable with his current medication which is Abilify 5 mg daily, Effexor 75 mg daily and mirtazapine 15 mg at bedtime.  He is also not interested in therapy.  He recalled higher dose of mirtazapine because dry mouth.  I explained gabapentin is not SSRI and least to cause anticholinergic side effects but patient refused to try.  Later he did reported that he may consider with his PCP if he wants to try gabapentin and then let us know.  He like to keep appointment in 6 months.  We will continue his current medication.  Recommended to call us back if there is any question or any concern.  Follow-up in 6 months.  Follow Up Instructions:    I discussed the assessment and treatment plan with the patient. The  patient was provided an opportunity to ask questions and all were answered. The patient agreed with the plan and demonstrated an understanding of the instructions.   The patient was advised to call back or seek an in-person evaluation if the symptoms worsen or if the condition fails to improve as anticipated.  I provided 16 minutes of non-face-to-face time during this encounter.   Kathlee Nations, MD

## 2020-11-10 ENCOUNTER — Telehealth: Payer: Self-pay

## 2020-11-10 NOTE — Telephone Encounter (Signed)
Called pt left VM to call back to schedule a telephone AWV.  KP

## 2021-01-18 ENCOUNTER — Other Ambulatory Visit (HOSPITAL_COMMUNITY): Payer: Self-pay | Admitting: Psychiatry

## 2021-01-18 ENCOUNTER — Ambulatory Visit: Payer: Medicare Other | Admitting: Internal Medicine

## 2021-01-18 DIAGNOSIS — F429 Obsessive-compulsive disorder, unspecified: Secondary | ICD-10-CM

## 2021-01-18 DIAGNOSIS — F411 Generalized anxiety disorder: Secondary | ICD-10-CM

## 2021-01-26 ENCOUNTER — Other Ambulatory Visit: Payer: Self-pay

## 2021-01-26 ENCOUNTER — Telehealth (HOSPITAL_BASED_OUTPATIENT_CLINIC_OR_DEPARTMENT_OTHER): Payer: Medicare Other | Admitting: Psychiatry

## 2021-01-26 ENCOUNTER — Telehealth (HOSPITAL_COMMUNITY): Payer: Medicare Other | Admitting: Psychiatry

## 2021-01-26 ENCOUNTER — Encounter (HOSPITAL_COMMUNITY): Payer: Self-pay | Admitting: Psychiatry

## 2021-01-26 DIAGNOSIS — F411 Generalized anxiety disorder: Secondary | ICD-10-CM

## 2021-01-26 DIAGNOSIS — F429 Obsessive-compulsive disorder, unspecified: Secondary | ICD-10-CM

## 2021-01-26 MED ORDER — ARIPIPRAZOLE 5 MG PO TABS: 5.0000 mg | ORAL_TABLET | Freq: Every day | ORAL | 1 refills | Status: DC

## 2021-01-26 MED ORDER — MIRTAZAPINE 15 MG PO TABS
15.0000 mg | ORAL_TABLET | Freq: Every day | ORAL | 1 refills | Status: DC
Start: 2021-01-26 — End: 2021-04-28

## 2021-01-26 MED ORDER — VENLAFAXINE HCL ER 75 MG PO CP24
75.0000 mg | ORAL_CAPSULE | Freq: Every day | ORAL | 1 refills | Status: DC
Start: 2021-01-26 — End: 2021-04-28

## 2021-01-26 NOTE — Progress Notes (Signed)
Virtual Visit via Video Note  I connected with [name] on 01/26/21 at  8:40 AM EST by a video enabled telemedicine application and verified that I am speaking with the correct person using two identifiers.  Location: Patient: Home Provider: Office   I discussed the limitations of evaluation and management by telemedicine and the availability of in person appointments. The patient expressed understanding and agreed to proceed.  History of Present Illness: Patient is evaluated by a video session.  He has residual obsessive thoughts when he do rituals as checking locks multiple times.  He still feels anxious and does not leave the house unless it is important and does not like going to crowded places.  He sleeps good.  Lately he noticed that his hands are cold but do not recall any panic attack or anxiety attack.  He is not sure why his hands sometimes cold and he cannot explain any triggers.  He denies any crying spells, feeling of hopelessness or worthlessness.  He is getting enough energy so he can do daily routine work.  Sometimes he has mild tremors but does not interfere in his daily activities.  His appetite is okay.  His weight is stable.  He has not seen his PCP in 1 year but like to have schedule very soon.  Patient is not interested in change or start new medication.  Like to keep on his current medication.  He has no paranoia, suicidal thoughts or any delusions.   Past Psychiatric History:  H/O anxiety, Tic and depression since age 31.  Received treatment in [place].  Tried Geodon, Risperdal, Zoloft, Valium, Klonopin, Xanax, Prozac, Ambien, and Seroquel.  Saw psychiatrist at [facility] and then in [place] and [place].  Tried Luvox but made him more irritable.  No history of suicidal attempt.  Psychiatric Specialty Exam: Physical Exam  Review of Systems  Weight 230 lb (104.3 kg).There is no height or weight on file to calculate BMI.  General Appearance: Casual   Eye Contact:  Fair  Speech:  Slow  Volume:  Decreased  Mood:  Anxious  Affect:  Congruent  Thought Process:  Goal Directed  Orientation:  Full (Time, Place, and Person)  Thought Content:  Obsessions  Suicidal Thoughts:  No  Homicidal Thoughts:  No  Memory:  Immediate;   Good Recent;   Good Remote;   Good  Judgement:  Intact  Insight:  Present  Psychomotor Activity:  Tremor  Concentration:  Concentration: Fair and Attention Span: Fair  Recall:  Good  Fund of Knowledge:  Good  Language:  Good  Akathisia:  No  Handed:  Right  AIMS (if indicated):     Assets:  Communication Skills Desire for Improvement Housing Transportation  ADL's:  Intact  Cognition:  WNL  Sleep:   8 hrs      Assessment and Plan: Obsessive-compulsive disorder.  Generalized anxiety disorder.  Patient is not interested to change his medication.  I encourage keep appointment with PCP to had a blood work.  He is not interested in therapy.  Continue Effexor 75 mg daily, mirtazapine 15 mg at bedtime and Abilify 5 mg daily.  Recommended to call us back if is any question or any concern.  Follow-up in 3 months.  Follow Up Instructions:    I discussed the assessment and treatment plan with the patient. The patient was provided an opportunity to ask questions and all were answered. The patient agreed with the plan and demonstrated an  understanding of the instructions.   The patient was advised to call back or seek an in-person evaluation if the symptoms worsen or if the condition fails to improve as anticipated.  I provided 19 minutes of non-face-to-face time during this encounter.   Kathlee Nations, MD

## 2021-02-10 DIAGNOSIS — Z8042 Family history of malignant neoplasm of prostate: Secondary | ICD-10-CM | POA: Diagnosis not present

## 2021-02-10 DIAGNOSIS — N281 Cyst of kidney, acquired: Secondary | ICD-10-CM | POA: Diagnosis not present

## 2021-03-22 ENCOUNTER — Encounter: Payer: Self-pay | Admitting: Internal Medicine

## 2021-03-23 ENCOUNTER — Encounter: Payer: Self-pay | Admitting: Internal Medicine

## 2021-03-24 NOTE — Telephone Encounter (Signed)
Patient checking status of letter request  Patient requesting letter mailed to home address  *see below*

## 2021-03-28 NOTE — Telephone Encounter (Signed)
Generated letter place in purple folder to sign.Marland KitchenJohny Ellison

## 2021-04-28 ENCOUNTER — Other Ambulatory Visit: Payer: Self-pay

## 2021-04-28 ENCOUNTER — Encounter (HOSPITAL_COMMUNITY): Payer: Self-pay | Admitting: Psychiatry

## 2021-04-28 ENCOUNTER — Telehealth (HOSPITAL_BASED_OUTPATIENT_CLINIC_OR_DEPARTMENT_OTHER): Payer: Medicare Other | Admitting: Psychiatry

## 2021-04-28 DIAGNOSIS — F429 Obsessive-compulsive disorder, unspecified: Secondary | ICD-10-CM | POA: Diagnosis not present

## 2021-04-28 DIAGNOSIS — F411 Generalized anxiety disorder: Secondary | ICD-10-CM | POA: Diagnosis not present

## 2021-04-28 MED ORDER — ARIPIPRAZOLE 5 MG PO TABS: 5.0000 mg | ORAL_TABLET | Freq: Every day | ORAL | 1 refills | Status: DC

## 2021-04-28 MED ORDER — VENLAFAXINE HCL ER 75 MG PO CP24: 75.0000 mg | ORAL_CAPSULE | Freq: Every day | ORAL | 1 refills | Status: DC

## 2021-04-28 MED ORDER — MIRTAZAPINE 15 MG PO TABS: 15.0000 mg | ORAL_TABLET | Freq: Every day | ORAL | 1 refills | Status: DC

## 2021-04-28 NOTE — Progress Notes (Signed)
Virtual Visit via Video Note  I connected with Bruce Ellison on 04/28/21 at  3:00 PM EST by a video enabled telemedicine application and verified that I am speaking with the correct person using two identifiers.  Location: Patient: Home Provider: Office   I discussed the limitations of evaluation and management by telemedicine and the availability of in person appointments. The patient expressed understanding and agreed to proceed.  History of Present Illness: Patient is evaluated by video session.  He is taking mirtazapine Abilify and Effexor.  He feels his symptoms are chronic but stable.  He has residual obsessive thoughts as some time he do check the locks and get anxious but manageable.  He started going outside and tried to walk every day.  He is still feel very anxious driving long distance and in crowded places.  However he denies any major panic attack.  His appetite is okay.  His weight is stable.  Recently he had a visit to urology and found to have renal cyst.  He does not want to change the medication since it is working well.  He denies any suicidal thoughts, homicidal thoughts.  He gets very anxious and sometimes have difficulty expressing and explaining his symptoms.  He has mild tremors but they are chronic and does not interfere in his daily activities.  He denies drinking or using any illegal substances.    Past Psychiatric History:  H/O anxiety, Tic and depression since age 26.  Received treatment in [place].  Tried Geodon, Risperdal, Zoloft, Valium, Klonopin, Xanax, Prozac, Ambien, and Seroquel.  Saw psychiatrist at [facility] and then in [place] and [place].  Tried Luvox but made him more irritable.  No history of suicidal attempt.   Psychiatric Specialty Exam: Physical Exam  Review of Systems  Weight 230 lb (104.3 kg).There is no height or weight on file to calculate BMI.  General Appearance: Casual  Eye Contact:  Fair  Speech:  Slow  Volume:  Decreased   Mood:  Anxious  Affect:  Congruent  Thought Process:  Goal Directed  Orientation:  Full (Time, Place, and Person)  Thought Content:  Obsessions and Rumination  Suicidal Thoughts:  No  Homicidal Thoughts:  No  Memory:  Immediate;   Good Recent;   Fair Remote;   Fair  Judgement:  Fair  Insight:  Shallow  Psychomotor Activity:   mild tremors  Concentration:  Concentration: Fair and Attention Span: Fair  Recall:  Good  Fund of Knowledge:  Fair  Language:  Fair  Akathisia:  No  Handed:  Right  AIMS (if indicated):     Assets:  Communication Skills Desire for Improvement Housing Social Support Transportation  ADL's:  Intact  Cognition:  WNL  Sleep:   fair     Assessment and Plan: Obsessive-compulsive disorder.  Generalized anxiety disorder.  Patient like to keep on his current medication.  He is not interested in therapy.  Continue Effexor 75 mg daily, mirtazapine 50 mg at bedtime and Abilify 5 mg daily.  Encourage regular exercise as he started recently and that is helping.  Recommended to call us back if is any question or any concern.  Follow-up in 3 months.  Follow Up Instructions:    I discussed the assessment and treatment plan with the patient. The patient was provided an opportunity to ask questions and all were answered. The patient agreed with the plan and demonstrated an understanding of the instructions.   The patient was advised to  call back or seek an in-person evaluation if the symptoms worsen or if the condition fails to improve as anticipated.  I provided 15 minutes of non-face-to-face time during this encounter.   Kathlee Nations, MD

## 2021-10-11 ENCOUNTER — Emergency Department (HOSPITAL_COMMUNITY): Payer: Medicare Other

## 2021-10-11 ENCOUNTER — Observation Stay (HOSPITAL_COMMUNITY)
Admission: EM | Admit: 2021-10-11 | Discharge: 2021-10-12 | Disposition: A | Discharge status: Home / Self Care | Payer: Medicare Other | Attending: Internal Medicine | Admitting: Internal Medicine

## 2021-10-11 ENCOUNTER — Encounter (HOSPITAL_COMMUNITY): Payer: Self-pay | Admitting: Emergency Medicine

## 2021-10-11 ENCOUNTER — Other Ambulatory Visit: Payer: Self-pay

## 2021-10-11 DIAGNOSIS — I10 Essential (primary) hypertension: Secondary | ICD-10-CM | POA: Diagnosis not present

## 2021-10-11 DIAGNOSIS — K219 Gastro-esophageal reflux disease without esophagitis: Secondary | ICD-10-CM | POA: Diagnosis present

## 2021-10-11 DIAGNOSIS — G4730 Sleep apnea, unspecified: Secondary | ICD-10-CM | POA: Insufficient documentation

## 2021-10-11 DIAGNOSIS — R0789 Other chest pain: Secondary | ICD-10-CM | POA: Diagnosis not present

## 2021-10-11 DIAGNOSIS — I214 Non-ST elevation (NSTEMI) myocardial infarction: Secondary | ICD-10-CM | POA: Insufficient documentation

## 2021-10-11 DIAGNOSIS — R739 Hyperglycemia, unspecified: Secondary | ICD-10-CM | POA: Insufficient documentation

## 2021-10-11 DIAGNOSIS — F419 Anxiety disorder, unspecified: Secondary | ICD-10-CM

## 2021-10-11 DIAGNOSIS — R079 Chest pain, unspecified: Secondary | ICD-10-CM | POA: Diagnosis not present

## 2021-10-11 DIAGNOSIS — R0602 Shortness of breath: Secondary | ICD-10-CM | POA: Diagnosis not present

## 2021-10-11 LAB — TROPONIN I (HIGH SENSITIVITY)
Troponin I (High Sensitivity): 5 ng/L (ref <18)
Troponin I (High Sensitivity): 23 ng/L — ABNORMAL HIGH (ref <18)
Troponin I (High Sensitivity): 18 ng/L — ABNORMAL HIGH (ref <18)

## 2021-10-11 LAB — BASIC METABOLIC PANEL
Anion gap: 6 (ref 5–15)
BUN: 18 mg/dL (ref 6–20)
CO2: 24 mmol/L (ref 22–32)
Calcium: 9.5 mg/dL (ref 8.9–10.3)
Chloride: 107 mmol/L (ref 98–111)
Creatinine, Ser: 0.97 mg/dL (ref 0.61–1.24)
GFR, Estimated: >60 mL/min (ref >60)
Glucose, Bld: 163 mg/dL — ABNORMAL HIGH (ref 70–99)
Potassium: 3.6 mmol/L (ref 3.5–5.1)
Sodium: 137 mmol/L (ref 135–145)

## 2021-10-11 LAB — CBC
HCT: 48 % (ref 39.0–52.0)
Hemoglobin: 17 g/dL (ref 13.0–17.0)
MCH: 30.9 pg (ref 26.0–34.0)
MCHC: 35.4 g/dL (ref 30.0–36.0)
MCV: 87.3 fL (ref 80.0–100.0)
Platelets: 223 10*3/uL (ref 150–400)
RBC: 5.5 MIL/uL (ref 4.22–5.81)
RDW: 12.8 % (ref 11.5–15.5)
WBC: 5.2 10*3/uL (ref 4.0–10.5)
nRBC: 0 % (ref 0.0–0.2)

## 2021-10-11 LAB — HEPATIC FUNCTION PANEL
ALT: 43 U/L (ref 0–44)
AST: 29 U/L (ref 15–41)
Albumin: 4.5 g/dL (ref 3.5–5.0)
Alkaline Phosphatase: 71 U/L (ref 38–126)
Bilirubin, Direct: 0.1 mg/dL (ref 0.0–0.2)
Indirect Bilirubin: 0.3 mg/dL (ref 0.3–0.9)
Total Bilirubin: 0.4 mg/dL (ref 0.3–1.2)
Total Protein: 7.4 g/dL (ref 6.5–8.1)

## 2021-10-11 LAB — LIPASE, BLOOD: Lipase: 25 U/L (ref 11–51)

## 2021-10-11 MED ORDER — ENOXAPARIN SODIUM 40 MG/0.4ML IJ SOSY
40.0000 mg | PREFILLED_SYRINGE | INTRAMUSCULAR | Status: DC
Start: 2021-10-11 — End: 2021-10-12
  Administered 2021-10-11: 40 mg via SUBCUTANEOUS
  Filled 2021-10-11: qty 0.4

## 2021-10-11 MED ORDER — IOHEXOL 350 MG/ML SOLN
80.0000 mL | Freq: Once | INTRAVENOUS | Status: AC | PRN
  Administered 2021-10-11: 75 mL via INTRAVENOUS

## 2021-10-11 MED ORDER — ARIPIPRAZOLE 5 MG PO TABS
5.0000 mg | ORAL_TABLET | Freq: Once | ORAL | Status: AC
Start: 2021-10-12 — End: 2021-10-11
  Administered 2021-10-11: 5 mg via ORAL
  Filled 2021-10-11: qty 1

## 2021-10-11 MED ORDER — ONDANSETRON HCL 4 MG/2ML IJ SOLN: 4.0000 mg | Freq: Four times a day (QID) | INTRAMUSCULAR | Status: DC | PRN

## 2021-10-11 MED ORDER — PANTOPRAZOLE SODIUM 40 MG PO TBEC
40.0000 mg | DELAYED_RELEASE_TABLET | Freq: Every day | ORAL | Status: DC
  Administered 2021-10-11: 40 mg via ORAL
  Filled 2021-10-11: qty 1

## 2021-10-11 MED ORDER — VENLAFAXINE HCL ER 75 MG PO CP24
75.0000 mg | ORAL_CAPSULE | Freq: Every day | ORAL | Status: AC
Start: 2021-10-12 — End: 2021-10-11
  Administered 2021-10-11: 75 mg via ORAL
  Filled 2021-10-11: qty 1

## 2021-10-11 MED ORDER — ALPRAZOLAM 0.25 MG PO TABS
0.2500 mg | ORAL_TABLET | Freq: Two times a day (BID) | ORAL | Status: DC | PRN
  Administered 2021-10-11: 0.25 mg via ORAL
  Filled 2021-10-11: qty 1

## 2021-10-11 MED ORDER — ACETAMINOPHEN 325 MG PO TABS
650.0000 mg | ORAL_TABLET | ORAL | Status: DC | PRN
  Administered 2021-10-11: 650 mg via ORAL
  Filled 2021-10-11: qty 2

## 2021-10-11 MED ORDER — ASPIRIN 81 MG PO TBEC
81.0000 mg | DELAYED_RELEASE_TABLET | Freq: Every day | ORAL | Status: DC
  Administered 2021-10-12: 81 mg via ORAL
  Filled 2021-10-11: qty 1

## 2021-10-11 MED ORDER — ASPIRIN 81 MG PO CHEW
324.0000 mg | CHEWABLE_TABLET | Freq: Once | ORAL | Status: AC
  Administered 2021-10-11: 324 mg via ORAL
  Filled 2021-10-11: qty 4

## 2021-10-11 MED ORDER — MIRTAZAPINE 15 MG PO TABS
15.0000 mg | ORAL_TABLET | Freq: Every day | ORAL | Status: AC
  Administered 2021-10-11: 15 mg via ORAL
  Filled 2021-10-11: qty 1

## 2021-10-11 NOTE — H&P (Signed)
History and Physical    Patient: Bruce Ellison DOB: 05-26-1980 DOA: 10/11/2021 DOS: the patient was seen and examined on 10/11/2021 PCP: Plotnikov, Evie Lacks, MD  Patient coming from: Home  Chief Complaint:  Chief Complaint  Patient presents with   Dizziness   Chest Pain   Shortness of Breath   HPI: Bruce Ellison is a 41 y.o. male with past medical history of anxiety, GERD. Presenting with left sided chest pain. It was a crampy pain that was constant for about 2.5-3 hours. It was located in the left chest/axilla and radiated to his back. He had dyspnea and palpitations. He was at rest when his symptoms started. He has not had symptoms like this before. He didn't try any medication to help. He became concerned and came to the ED for assistance. He denies any other aggravating or alleviating factors.     Review of Systems: As mentioned in the history of present illness. All other systems reviewed and are negative. Past Medical History:  Diagnosis Date   Adenomatous colon polyp    tubular   Allergic rhinitis    Anxiety    Depression    GERD (gastroesophageal reflux disease)    Glucose intolerance (impaired glucose tolerance)    HTN (hypertension)    borderline   Insomnia    Obesity    OCD (obsessive compulsive disorder)    Personality disorder (HCC)    with schizoid features   Sleep apnea    Tourette's    per [facility] neuro   Varicose veins of both lower extremities    Vitamin D deficiency    Past Surgical History:  Procedure Laterality Date   adnoidectomy  46   Social History:  reports that he has never smoked. He has never used smokeless tobacco. He reports that he does not drink alcohol and does not use drugs.  Allergies  Allergen Reactions   Honey Bee Treatment [Bee Venom] Anaphylaxis, Hives and Rash    Pt reports he does not go into anaphylaxis with bee stings, just honey   Doxycycline    Morphine Other (See Comments)    Pt doesn't like side effects    Quetiapine Other (See Comments)    restlessness   Penicillins Hives and Rash    Did it involve swelling of the face/tongue/throat, SOB, or low BP? no Did it involve sudden or severe rash/hives, skin peeling, or any reaction on the inside of your mouth or nose? yes Did you need to seek medical attention at a hospital or doctor's office? yes When did it last happen?      1995 If all above answers are "NO", may proceed with cephalosporin use.    Sulfa Antibiotics Hives and Rash   Sulfamethoxazole Rash and Hives    Family History  Problem Relation Age of Onset   Depression Mother    Bipolar disorder Father    Colon cancer Other    Cholelithiasis Maternal Grandmother    Diabetes Maternal Grandmother    Prostate cancer Maternal Grandfather    Colon polyps Maternal Grandfather    Esophageal cancer Neg Hx    Rectal cancer Neg Hx    Stomach cancer Neg Hx     Prior to Admission medications   Medication Sig Start Date End Date Taking? Authorizing Provider  ARIPiprazole (ABILIFY) 5 MG tablet Take 1 tablet (5 mg total) by mouth daily. 04/28/21   [name], Arlyce Harman, MD  Cholecalciferol (VITAMIN D3) 50 MCG (2000 UT) capsule Take  1 capsule (2,000 Units total) by mouth daily. 12/11/19   Plotnikov, Evie Lacks, MD  desonide (DESOWEN) 0.05 % cream Apply 1 application topically daily as needed (dermatitis). 07/05/20   Plotnikov, Evie Lacks, MD  famotidine (PEPCID) 20 MG tablet Take 1 tablet (20 mg total) by mouth 2 (two) times daily. Patient not taking: Reported on 01/26/2021 05/27/19   Marrian Salvage, FNP  loratadine (CLARITIN) 10 MG tablet Take 1 tablet (10 mg total) by mouth daily. 12/11/19   Plotnikov, Evie Lacks, MD  mirtazapine (REMERON) 15 MG tablet Take 1 tablet (15 mg total) by mouth at bedtime. 04/28/21   [name], Arlyce Harman, MD  Multiple Vitamin (MULTIVITAMIN WITH MINERALS) TABS Take 1 tablet by mouth every morning.    [provider]  nystatin (MYCOSTATIN) 100000 UNIT/ML suspension Take 5  mLs (500,000 Units total) by mouth 4 (four) times daily. 12/23/18   Marrian Salvage, FNP  traZODone (DESYREL) 50 MG tablet Take 1 tablet (50 mg total) by mouth at bedtime as needed for sleep. Patient not taking: Reported on 02/02/2020 07/31/19   Kathlee Nations, MD  venlafaxine XR (EFFEXOR-XR) 75 MG 24 hr capsule Take 1 capsule (75 mg total) by mouth daily with breakfast. 04/28/21   Kathlee Nations, MD    Physical Exam: Vitals:   10/11/21 1530 10/11/21 1627 10/11/21 1630 10/11/21 1740  BP: 129/79 124/76 129/78   Pulse: (!) 104 (!) 113 (!) 112   Resp: (!) 0 17 17   Temp:      TempSrc:      SpO2: 97% 97% 98%   Weight:    105 kg  Height:    '5\' 6"'$  (1.676 m)   General: 41 y.o. male resting in bed in NAD Eyes: PERRL, normal sclera ENMT: Nares patent w/o discharge, orophaynx clear, dentition normal, ears w/o discharge/lesions/ulcers Neck: Supple, trachea midline Cardiovascular: tachy, +S1, S2, no m/g/r, equal pulses throughout Respiratory: CTABL, no w/r/r, normal WOB GI: BS+, NDNT, no masses noted, no organomegaly noted MSK: No e/c/c Neuro: A&O x 3, no focal deficits Psyc: Appropriate interaction and affect, calm/cooperative  Data Reviewed:  Lab Results  Component Value Date   NA 137 10/11/2021   K 3.6 10/11/2021   CO2 24 10/11/2021   GLUCOSE 163 (H) 10/11/2021   BUN 18 10/11/2021   CREATININE 0.97 10/11/2021   CALCIUM 9.5 10/11/2021   GFRNONAA >60 10/11/2021   Lab Results  Component Value Date   WBC 5.2 10/11/2021   HGB 17.0 10/11/2021   HCT 48.0 10/11/2021   MCV 87.3 10/11/2021   PLT 223 10/11/2021   Trp 5 -> 23  CTA Chest IMPRESSION: Vascular: No evidence of pulmonary embolism or acute aortic syndrome. Non-Vascular: No acute intrathoracic abnormality.  EKG: sinus tach, no st elevations  Assessment and Plan: Chest pain     - notified by EDP that he has changed his mind again about leaving AMA     - place in obs, tele     - trp 5 -> 23; trend     - EKG  non-ischemic     - CTA chest negative     - check echo     - check lipid panel  Anxiety     - continue home regimen when confirmed  GERD     - PPI  Hyperglycemia     - check A1c  Advance Care Planning:   Code Status: FULL  Consults: None  Family Communication: w/ mother at bedside  Severity of Illness: The appropriate patient status for this patient is OBSERVATION. Observation status is judged to be reasonable and necessary in order to provide the required intensity of service to ensure the patient's safety. The patient's presenting symptoms, physical exam findings, and initial radiographic and laboratory data in the context of their medical condition is felt to place them at decreased risk for further clinical deterioration. Furthermore, it is anticipated that the patient will be medically stable for discharge from the hospital within 2 midnights of admission.   Author: Jonnie Finner, DO 10/11/2021 6:28 PM  For on call review [email or web address].

## 2021-10-11 NOTE — Discharge Instructions (Addendum)
You are leaving [facility].  Heart attack has not been ruled out completely.  Return to the ED if you wish to be reevaluated.

## 2021-10-11 NOTE — Consult Note (Addendum)
Initial Consultation Note   Patient: Bruce Ellison TLX:[id number] DOB: [birth year] PCP: Cassandria Anger, MD DOA: 10/11/2021 DOS: the patient was seen and examined on 10/11/2021 Primary service: Ezequiel Essex, MD  Referring physician: Dr. Wyvonnia Dusky Reason for consult: Chest pain  Assessment and Plan: Chest pain     - patient presenting with left side chest pain     - w/u thus far has been negative     - at end of interview and examination; he informed me that he did not want to be admitted     - I informed Dr. Wyvonnia Dusky; he was unable to successfully convince the patient to stay; thus the patient is leaving AMA.   TRH will sign off at present, please call us again when needed.  HPI: Bruce Ellison is a 41 y.o. male with past medical history of anxiety, GERD. Presenting with left sided chest pain. It was a crampy pain that was constant for about 2.5-3 hours. It was located in the left chest/axilla and radiated to his back. He had dyspnea and palpitations. He was at rest when his symptoms started. He has not had symptoms like this before. He didn't try any medication to help. He became concerned and came to the ED for assistance. He denies any other aggravating or alleviating factors.   Review of Systems: As mentioned in the history of present illness. All other systems reviewed and are negative. Past Medical History:  Diagnosis Date   Adenomatous colon polyp    tubular   Allergic rhinitis    Anxiety    Depression    GERD (gastroesophageal reflux disease)    Glucose intolerance (impaired glucose tolerance)    HTN (hypertension)    borderline   Insomnia    Obesity    OCD (obsessive compulsive disorder)    Personality disorder (HCC)    with schizoid features   Sleep apnea    Tourette's    per [facility] neuro   Varicose veins of both lower extremities    Vitamin D deficiency    Past Surgical History:  Procedure Laterality Date   adnoidectomy  50   Social History:  reports that he  has never smoked. He has never used smokeless tobacco. He reports that he does not drink alcohol and does not use drugs.  Allergies  Allergen Reactions   Honey Bee Treatment [Bee Venom] Anaphylaxis, Hives and Rash    Pt reports he does not go into anaphylaxis with bee stings, just honey   Doxycycline    Morphine Other (See Comments)    Pt doesn't like side effects   Quetiapine Other (See Comments)    restlessness   Penicillins Hives and Rash    Did it involve swelling of the face/tongue/throat, SOB, or low BP? no Did it involve sudden or severe rash/hives, skin peeling, or any reaction on the inside of your mouth or nose? yes Did you need to seek medical attention at a hospital or doctor's office? yes When did it last happen?      1995 If all above answers are "NO", may proceed with cephalosporin use.    Sulfa Antibiotics Hives and Rash   Sulfamethoxazole Rash and Hives    Family History  Problem Relation Age of Onset   Depression Mother    Bipolar disorder Father    Colon cancer Other    Cholelithiasis Maternal Grandmother    Diabetes Maternal Grandmother    Prostate cancer Maternal Grandfather    Colon polyps  Maternal Grandfather    Esophageal cancer Neg Hx    Rectal cancer Neg Hx    Stomach cancer Neg Hx     Prior to Admission medications   Medication Sig Start Date End Date Taking? Authorizing Provider  ARIPiprazole (ABILIFY) 5 MG tablet Take 1 tablet (5 mg total) by mouth daily. 04/28/21   [name], Arlyce Harman, MD  Cholecalciferol (VITAMIN D3) 50 MCG (2000 UT) capsule Take 1 capsule (2,000 Units total) by mouth daily. 12/11/19   Plotnikov, Evie Lacks, MD  desonide (DESOWEN) 0.05 % cream Apply 1 application topically daily as needed (dermatitis). 07/05/20   Plotnikov, Evie Lacks, MD  famotidine (PEPCID) 20 MG tablet Take 1 tablet (20 mg total) by mouth 2 (two) times daily. Patient not taking: Reported on 01/26/2021 05/27/19   Marrian Salvage, FNP  loratadine (CLARITIN) 10 MG  tablet Take 1 tablet (10 mg total) by mouth daily. 12/11/19   Plotnikov, Evie Lacks, MD  mirtazapine (REMERON) 15 MG tablet Take 1 tablet (15 mg total) by mouth at bedtime. 04/28/21   [name], Arlyce Harman, MD  Multiple Vitamin (MULTIVITAMIN WITH MINERALS) TABS Take 1 tablet by mouth every morning.    [provider]  nystatin (MYCOSTATIN) 100000 UNIT/ML suspension Take 5 mLs (500,000 Units total) by mouth 4 (four) times daily. 12/23/18   Marrian Salvage, FNP  traZODone (DESYREL) 50 MG tablet Take 1 tablet (50 mg total) by mouth at bedtime as needed for sleep. Patient not taking: Reported on 02/02/2020 07/31/19   Kathlee Nations, MD  venlafaxine XR (EFFEXOR-XR) 75 MG 24 hr capsule Take 1 capsule (75 mg total) by mouth daily with breakfast. 04/28/21   Kathlee Nations, MD    Physical Exam: Vitals:   10/11/21 1530 10/11/21 1627 10/11/21 1630 10/11/21 1740  BP: 129/79 124/76 129/78   Pulse: (!) 104 (!) 113 (!) 112   Resp: (!) 0 17 17   Temp:      TempSrc:      SpO2: 97% 97% 98%   Weight:    105 kg  Height:    '5\' 6"'$  (1.676 m)   General: 41 y.o. male resting in bed in NAD Eyes: PERRL, normal sclera ENMT: Nares patent w/o discharge, orophaynx clear, dentition normal, ears w/o discharge/lesions/ulcers Neck: Supple, trachea midline Cardiovascular: RRR, +S1, S2, no m/g/r, equal pulses throughout Respiratory: CTABL, no w/r/r, normal WOB GI: BS+, NDNT, no masses noted, no organomegaly noted MSK: No e/c/c Neuro: A&O x 3, no focal deficits Psyc: Appropriate interaction and affect, calm/cooperative  Data Reviewed:   Lab Results  Component Value Date   NA 137 10/11/2021   K 3.6 10/11/2021   CO2 24 10/11/2021   GLUCOSE 163 (H) 10/11/2021   BUN 18 10/11/2021   CREATININE 0.97 10/11/2021   CALCIUM 9.5 10/11/2021   GFRNONAA >60 10/11/2021   Lab Results  Component Value Date   WBC 5.2 10/11/2021   HGB 17.0 10/11/2021   HCT 48.0 10/11/2021   MCV 87.3 10/11/2021   PLT 223 10/11/2021        Family Communication: w/ mother at bedside Primary team communication: w/ Dr. Wyvonnia Dusky Thank you very much for involving [place] in the care of your patient.  Author: Jonnie Finner, DO 10/11/2021 5:45 PM  For on call review [email or web address].

## 2021-10-11 NOTE — ED Notes (Addendum)
Patient currently in the bathroom. He has visited the restroom ~ 5-6 times since being here. Went to room to check on patient and he is fully dressed, states the MD told him he would be leaving

## 2021-10-11 NOTE — ED Provider Notes (Signed)
Emergency Department Provider Note   I have reviewed the triage vital signs and the nursing notes.   HISTORY  Chief Complaint Dizziness, Chest Pain, and Shortness of Breath   HPI Bruce Ellison is a 41 y.o. male with PMH reviewed including HTN presents emergency room for evaluation of acute onset left chest pain radiating to the back.  He was seated at the time of onset.  He is feeling some heart palpitations, flushed feeling, shortness of breath.  Denies any recent travel.  No pain or swelling in the legs.  No prior history of DVT or PE. No cough or congestion.  Patient was walking back into the room and felt like discomfort in the left chest got slightly worse along with dyspnea.    Past Medical History:  Diagnosis Date   Adenomatous colon polyp    tubular   Allergic rhinitis    Anxiety    Depression    GERD (gastroesophageal reflux disease)    Glucose intolerance (impaired glucose tolerance)    HTN (hypertension)    borderline   Insomnia    Obesity    OCD (obsessive compulsive disorder)    Personality disorder (HCC)    with schizoid features   Sleep apnea    Tourette's    per [facility] neuro   Varicose veins of both lower extremities    Vitamin D deficiency     Review of Systems  Constitutional: No fever/chills Eyes: No visual changes. ENT: No sore throat. Cardiovascular: Positive chest pain and palpitations.  Respiratory: Positive shortness of breath. Gastrointestinal: No abdominal pain.  No nausea, no vomiting.  No diarrhea. Genitourinary: Negative for dysuria. Musculoskeletal: Negative for back pain. Skin: Negative for rash. Neurological: Negative for headaches, focal weakness or numbness.   ____________________________________________   PHYSICAL EXAM:  VITAL SIGNS: ED Triage Vitals [10/11/21 1437]  Enc Vitals Group     BP (!) 151/82     Pulse Rate (!) 114     Resp 16     Temp 98.2 F (36.8 C)     Temp Source Oral     SpO2 98 %   Constitutional:  Alert and oriented. Well appearing and in no acute distress. Eyes: Conjunctivae are normal. Head: Atraumatic. Nose: No congestion/rhinnorhea. Mouth/Throat: Mucous membranes are moist.   Neck: No stridor.   Cardiovascular: Tachycardia. Good peripheral circulation. Grossly normal heart sounds.   Respiratory: Normal respiratory effort.  No retractions. Lungs CTAB. Gastrointestinal: Soft and nontender. No distention.  Musculoskeletal: No lower extremity tenderness nor edema. No gross deformities of extremities. Neurologic:  Normal speech and language. No gross focal neurologic deficits are appreciated.  Skin:  Skin is warm, dry and intact. No rash noted.  ____________________________________________   LABS (all labs ordered are listed, but only abnormal results are displayed)  Labs Reviewed  CBC  BASIC METABOLIC PANEL  TROPONIN I (HIGH SENSITIVITY)   ____________________________________________  EKG   EKG Interpretation  Date/Time:  Tuesday October 11 2021 14:42:32 EDT Ventricular Rate:  115 PR Interval:  183 QRS Duration: 98 QT Interval:  315 QTC Calculation: 436 R Axis:   4 Text Interpretation: Sinus tachycardia 12 Lead; Mason-Likar Confirmed by Nanda Quinton [phone]) on 10/11/2021 2:44:55 PM        ____________________________________________  RADIOLOGY  DG Chest 2 View  Result Date: 10/11/2021 CLINICAL DATA:  Chest pain and shortness of breath EXAM: CHEST - 2 VIEW COMPARISON:  05/03/2018 FINDINGS: Artifact overlies the chest. Heart size is normal. Mediastinal shadows are normal.  The lungs are clear. The vascularity is normal. No effusions. No significant bone finding. IMPRESSION: No active cardiopulmonary disease. Electronically Signed   By: Nelson Chimes M.D.   On: 10/11/2021 15:10    ____________________________________________   PROCEDURES  Procedure(s) performed:   Procedures   ____________________________________________   INITIAL IMPRESSION / ASSESSMENT  AND PLAN / ED COURSE  Pertinent labs & imaging results that were available during my care of the patient were reviewed by me and considered in my medical decision making (see chart for details).   This patient is Presenting for Evaluation of CP, which does require a range of treatment options, and is a complaint that involves a high risk of morbidity and mortality.  The Differential Diagnoses includes but is not exclusive to acute coronary syndrome, aortic dissection, pulmonary embolism, cardiac tamponade, community-acquired pneumonia, pericarditis, musculoskeletal chest wall pain, etc.   Critical Interventions-    Medications - No data to display  Reassessment after intervention:     I *** Additional Historical Information from ***, as the patient is ***.  I decided to review pertinent External Data, and in summary no prior history of PE/DVT.   Clinical Laboratory Tests Ordered, included CBC without leukocytosis.   Radiologic Tests Ordered, included CXR and CTA PE. I independently interpreted the images and agree with radiology interpretation.   Cardiac Monitor Tracing which shows ***   Social Determinants of Health Risk ***  Consult complete with  Medical Decision Making: Summary: ***  Reevaluation with update and discussion with   ***Considered admission***  Disposition:   ____________________________________________  FINAL CLINICAL IMPRESSION(S) / ED DIAGNOSES  Final diagnoses:  None     NEW OUTPATIENT MEDICATIONS STARTED DURING THIS VISIT:  New Prescriptions   No medications on file    Note:  This document was prepared using Dragon voice recognition software and may include unintentional dictation errors.  Nanda Quinton, MD, [facility]

## 2021-10-11 NOTE — ED Provider Notes (Addendum)
Care assumed from Dr. Laverta Baltimore.  Patient with left-sided chest pain going to the back with shortness of breath, palpitations and shortness of breath while resting.  Now improved.  EKG without acute ischemia.  Troponin negative CT pulmonary embolism study pending.  CT negative for any pulmonary embolism or aortic pathology  Patient remains chest pain-free.  EKG is sinus tachycardia without acute ST changes.  Troponin negative x1.  Heart score is 3.  Patient agreeable to rule out observation overnight.  Discussed with Dr. Doree Albee, MD 10/11/21 1650  The patient has been seen by Dr. Marylyn Ishihara the hospitalist service.  Patient does not want to stay in the hospital wants to go home.  He is agreeable to obtain second troponin.  Patient now stating that he wants to leave and does not want to wait for second troponin.  He understands that heart attack has not been ruled out which can be life-threatening.  He appears to have capacity to leave [place] He understands a heart attack is not ruled out which can be life-threatening.  He appears to have capacity of leaving his medical advice.  Troponin was drawn but pending at time of patient leaving. Referral to cardiology given.   Ezequiel Essex, MD 10/11/21 1805  Troponin came back 2 3 from 5.  Discussed with patient that there is some damage to his heart and it is not recommend that he go home. He states he is going to "think about it".  Discussed with Dr. Doree Albee, MD 10/11/21 [phone]

## 2021-10-11 NOTE — ED Notes (Signed)
Pt refused vitals informed the nurse

## 2021-10-11 NOTE — ED Triage Notes (Signed)
Patient presents with intermittent left sided chest pain that radiates into his back.  Pain began about 3 hours ago while seated. He also has complaints of shortness of breath, heart racing, feeling hot and dizzy.

## 2021-10-12 ENCOUNTER — Observation Stay (HOSPITAL_BASED_OUTPATIENT_CLINIC_OR_DEPARTMENT_OTHER): Payer: Medicare Other

## 2021-10-12 DIAGNOSIS — R0789 Other chest pain: Secondary | ICD-10-CM | POA: Diagnosis not present

## 2021-10-12 DIAGNOSIS — R072 Precordial pain: Secondary | ICD-10-CM | POA: Diagnosis not present

## 2021-10-12 DIAGNOSIS — R079 Chest pain, unspecified: Secondary | ICD-10-CM | POA: Diagnosis not present

## 2021-10-12 LAB — LIPID PANEL
Cholesterol: 195 mg/dL (ref 0–200)
HDL: 33 mg/dL — ABNORMAL LOW (ref >40)
LDL Cholesterol: 142 mg/dL — ABNORMAL HIGH (ref 0–99)
Total CHOL/HDL Ratio: 5.9 RATIO
Triglycerides: 101 mg/dL (ref <150)
VLDL: 20 mg/dL (ref 0–40)

## 2021-10-12 LAB — HEMOGLOBIN A1C
Hgb A1c MFr Bld: 5.5 % (ref 4.8–5.6)
Mean Plasma Glucose: 111.15 mg/dL

## 2021-10-12 LAB — HIV ANTIBODY (ROUTINE TESTING W REFLEX): HIV Screen 4th Generation wRfx: NONREACTIVE

## 2021-10-12 LAB — ECHOCARDIOGRAM COMPLETE
Area-P 1/2: 3.77 cm2
Height: 66 in
S' Lateral: 3 cm
Weight: 3703.73 oz

## 2021-10-12 NOTE — Plan of Care (Signed)

## 2021-10-12 NOTE — Progress Notes (Signed)
  Transition of Care (TOC) Screening Note   Patient Details  Name: RIDLEY SCHEWE Date of Birth: 1980/06/07   Transition of Care [facility]) CM/SW Contact:    Roseanne Kaufman, RN Phone Number: 10/12/2021, 5:08 PM    Transition of Care Department [facility]) has reviewed patient and no TOC needs have been identified at this time. We will continue to monitor patient advancement through interdisciplinary progression rounds. If new patient transition needs arise, please place a TOC consult.

## 2021-10-12 NOTE — Progress Notes (Signed)
*  PRELIMINARY RESULTS* Echocardiogram 2D Echocardiogram has been performed.  Bruce Ellison 10/12/2021, 1:37 PM

## 2021-10-12 NOTE — Discharge Summary (Signed)
Physician Discharge Summary  Bruce Ellison KTG:[id number] DOB: 1980/09/26 DOA: 10/11/2021  PCP: Cassandria Anger, MD  Admit date: 10/11/2021 Discharge date: 10/12/2021  Admitted From: Home Disposition:  Home  Recommendations for Outpatient Follow-up:  Follow up with PCP in 1 week Follow up with Cardiology, referral placed.   Discharge Condition: Stable CODE STATUS: Full  Diet recommendation: Heart healthy   Brief/Interim Summary: From H&P by Dr. Marylyn Ishihara: "Bruce Ellison is a 41 y.o. male with past medical history of anxiety, GERD. Presenting with left sided chest pain. It was a crampy pain that was constant for about 2.5-3 hours. It was located in the left chest/axilla and radiated to his back. He had dyspnea and palpitations. He was at rest when his symptoms started. He has not had symptoms like this before. He didn't try any medication to help. He became concerned and came to the ED for assistance. He denies any other aggravating or alleviating factors."   Interim: Chest pain resolved last night spontaneously.  Remains chest pain-free this morning.  He has no symptoms of jaw pain, arm pain, back pain, nausea or vomiting.  He is feeling back to his baseline.  Echocardiogram as well as CTA chest, EKG were unremarkable.  Patient discharged home in stable condition and to follow-up with cardiology as outpatient, referral placed.  Discharge Diagnoses:   Principal Problem:   Chest pain Active Problems:   Anxiety   GERD   Hyperglycemia    Discharge Instructions  Discharge Instructions     Ambulatory referral to Cardiology   Complete by: As directed    Call MD for:  difficulty breathing, headache or visual disturbances   Complete by: As directed    Call MD for:  extreme fatigue   Complete by: As directed    Call MD for:  hives   Complete by: As directed    Call MD for:  persistant dizziness or light-headedness   Complete by: As directed    Call MD for:  persistant nausea and vomiting    Complete by: As directed    Call MD for:  severe uncontrolled pain   Complete by: As directed    Call MD for:  temperature >100.4   Complete by: As directed    Diet - low sodium heart healthy   Complete by: As directed    Discharge instructions   Complete by: As directed    You were cared for by a hospitalist during your hospital stay. If you have any questions about your discharge medications or the care you received while you were in the hospital after you are discharged, you can call the unit and ask to speak with the hospitalist on call if the hospitalist that took care of you is not available. Once you are discharged, your primary care physician will handle any further medical issues. Please note that NO REFILLS for any discharge medications will be authorized once you are discharged, as it is imperative that you return to your primary care physician (or establish a relationship with a primary care physician if you do not have one) for your aftercare needs so that they can reassess your need for medications and monitor your lab values.   Increase activity slowly   Complete by: As directed       Allergies as of 10/12/2021       Reactions   Honey Bee Treatment [bee Venom] Anaphylaxis, Hives, Rash   Pt reports he does not go into anaphylaxis with bee  stings, just honey   Penicillins Hives, Shortness Of Breath, Rash   Did it involve swelling of the face/tongue/throat, SOB, or low BP? no Did it involve sudden or severe rash/hives, skin peeling, or any reaction on the inside of your mouth or nose? yes Did you need to seek medical attention at a hospital or doctor's office? yes When did it last happen?      1995 If all above answers are "NO", may proceed with cephalosporin use.   Sulfa Antibiotics Hives, Shortness Of Breath, Rash, Other (See Comments)   "Feels flushed"   Doxycycline Other (See Comments)   dizziness   Morphine Other (See Comments)   Pt doesn't like side effects    Quetiapine Hives, Rash, Other (See Comments)   restlessness   Sulfamethoxazole Rash, Hives        Medication List     STOP taking these medications    famotidine 20 MG tablet Commonly known as: Pepcid   loratadine 10 MG tablet Commonly known as: CLARITIN   nystatin 100000 UNIT/ML suspension Commonly known as: MYCOSTATIN   traZODone 50 MG tablet Commonly known as: DESYREL       TAKE these medications    ARIPiprazole 5 MG tablet Commonly known as: ABILIFY Take 1 tablet (5 mg total) by mouth daily.   desonide 0.05 % cream Commonly known as: DESOWEN Apply 1 application topically daily as needed (dermatitis).   mirtazapine 15 MG tablet Commonly known as: REMERON Take 1 tablet (15 mg total) by mouth at bedtime.   multivitamin with minerals Tabs tablet Take 1 tablet by mouth every morning.   venlafaxine XR 75 MG 24 hr capsule Commonly known as: EFFEXOR-XR Take 1 capsule (75 mg total) by mouth daily with breakfast.   Vitamin D3 50 MCG (2000 UT) capsule Take 1 capsule (2,000 Units total) by mouth daily.        Follow-up Information     Plotnikov, Evie Lacks, MD. Schedule an appointment as soon as possible for a visit in 2 days.   Specialty: Internal Medicine Contact information: [place] [address] [phone]         [facility] Follow up.   Specialty: Cardiology Why: Referral placed to follow up Contact information: [address], [place] [address] [phone]               Allergies  Allergen Reactions   Honey Bee Treatment [Bee Venom] Anaphylaxis, Hives and Rash    Pt reports he does not go into anaphylaxis with bee stings, just honey   Penicillins Hives, Shortness Of Breath and Rash    Did it involve swelling of the face/tongue/throat, SOB, or low BP? no Did it involve sudden or severe rash/hives, skin peeling, or any reaction on the inside of your mouth or  nose? yes Did you need to seek medical attention at a hospital or doctor's office? yes When did it last happen?      1995 If all above answers are "NO", may proceed with cephalosporin use.    Sulfa Antibiotics Hives, Shortness Of Breath, Rash and Other (See Comments)    "Feels flushed"   Doxycycline Other (See Comments)    dizziness   Morphine Other (See Comments)    Pt doesn't like side effects   Quetiapine Hives, Rash and Other (See Comments)    restlessness   Sulfamethoxazole Rash and Hives    Consultations: None    Procedures/Studies: ECHOCARDIOGRAM  COMPLETE  Result Date: 10/12/2021    ECHOCARDIOGRAM REPORT   Patient Name:   SANTO ZAHRADNIK Date of Exam: 10/12/2021 Medical Rec #:  [id number]    Height:       66.0 in Accession #:    [id number]   Weight:       231.5 lb Date of Birth:  07-03-1980    BSA:          2.128 m Patient Age:    2 years     BP:           120/70 mmHg Patient Gender: M            HR:           82 bpm. Exam Location:  Inpatient Procedure: 2D Echo, Cardiac Doppler and Color Doppler Indications:    R07.9 Chest Pain  History:        Patient has no prior history of Echocardiogram examinations.                 Risk Factors:Hypertension and Dyslipidemia. Sleep apnea,                 Tourette's & OCD (obsessive compulsive disorder) (From Hx).  Sonographer:    Alvino Chapel RCS Referring Phys: [id number] [place]  1. Left ventricular ejection fraction, by estimation, is 60 to 65%. The left ventricle has normal function. The left ventricle has no regional wall motion abnormalities. Left ventricular diastolic parameters were normal.  2. Right ventricular systolic function is normal. The right ventricular size is normal.  3. The mitral valve is normal in structure. Trivial mitral valve regurgitation. No evidence of mitral stenosis.  4. The aortic valve is normal in structure. Aortic valve regurgitation is not visualized. No aortic stenosis is present.  5. The inferior vena  cava is normal in size with greater than 50% respiratory variability, suggesting right atrial pressure of 3 mmHg. FINDINGS  Left Ventricle: Left ventricular ejection fraction, by estimation, is 60 to 65%. The left ventricle has normal function. The left ventricle has no regional wall motion abnormalities. The left ventricular internal cavity size was normal in size. There is  no left ventricular hypertrophy. Left ventricular diastolic parameters were normal. Right Ventricle: The right ventricular size is normal. No increase in right ventricular wall thickness. Right ventricular systolic function is normal. Left Atrium: Left atrial size was normal in size. Right Atrium: Right atrial size was normal in size. Pericardium: There is no evidence of pericardial effusion. Mitral Valve: The mitral valve is normal in structure. Trivial mitral valve regurgitation. No evidence of mitral valve stenosis. Tricuspid Valve: The tricuspid valve is normal in structure. Tricuspid valve regurgitation is trivial. No evidence of tricuspid stenosis. Aortic Valve: The aortic valve is normal in structure. Aortic valve regurgitation is not visualized. No aortic stenosis is present. Pulmonic Valve: The pulmonic valve was not well visualized. Pulmonic valve regurgitation is trivial. No evidence of pulmonic stenosis. Aorta: The aortic root is normal in size and structure. Venous: The inferior vena cava is normal in size with greater than 50% respiratory variability, suggesting right atrial pressure of 3 mmHg. IAS/Shunts: No atrial level shunt detected by color flow Doppler.  LEFT VENTRICLE PLAX 2D LVIDd:         4.50 cm   Diastology LVIDs:         3.00 cm   LV e' medial:    9.48 cm/s LV PW:  0.90 cm   LV E/e' medial:  9.3 LV IVS:        0.90 cm   LV e' lateral:   17.30 cm/s LVOT diam:     2.20 cm   LV E/e' lateral: 5.1 LV SV:         75 LV SV Index:   35 LVOT Area:     3.80 cm  RIGHT VENTRICLE RV S prime:     14.90 cm/s TAPSE (M-mode):  2.7 cm LEFT ATRIUM             Index        RIGHT ATRIUM           Index LA diam:        4.00 cm 1.88 cm/m   RA Area:     15.40 cm LA Vol (A2C):   56.5 ml 26.55 ml/m  RA Volume:   34.60 ml  16.26 ml/m LA Vol (A4C):   46.1 ml 21.66 ml/m LA Biplane Vol: 51.6 ml 24.25 ml/m  AORTIC VALVE LVOT Vmax:   117.00 cm/s LVOT Vmean:  72.600 cm/s LVOT VTI:    0.198 m  AORTA Ao Root diam: 3.40 cm MITRAL VALVE MV Area (PHT): 3.77 cm    SHUNTS MV Decel Time: 201 msec    Systemic VTI:  0.20 m MV E velocity: 87.70 cm/s  Systemic Diam: 2.20 cm MV A velocity: 58.60 cm/s MV E/A ratio:  1.50 Kardie Tobb DO Electronically signed by Berniece Salines DO Signature Date/Time: 10/12/2021/2:22:37 PM    Final    CT Angio Chest PE W and/or Wo Contrast  Result Date: 10/11/2021 CLINICAL DATA:  41 year old male with left-sided chest pain radiating to the back. EXAM: CT ANGIOGRAPHY CHEST WITH CONTRAST TECHNIQUE: Multidetector CT imaging of the chest was performed using the standard protocol during bolus administration of intravenous contrast. Multiplanar CT image reconstructions and MIPs were obtained to evaluate the vascular anatomy. RADIATION DOSE REDUCTION: This exam was performed according to the departmental dose-optimization program which includes automated exposure control, adjustment of the mA and/or kV according to patient size and/or use of iterative reconstruction technique. CONTRAST:  Seventy-five mL Omnipaque 350, intravenous COMPARISON:  None Available. FINDINGS: Cardiovascular: Satisfactory opacification of the pulmonary arteries to the segmental level. No evidence of pulmonary embolism. Normal heart size. No pericardial effusion. Evidence of acute aortic abnormality. Mediastinum/Nodes: No enlarged mediastinal, hilar, or axillary lymph nodes. Thyroid gland, trachea, and esophagus demonstrate no significant findings. Lungs/Pleura: No focal consolidations. No suspicious pulmonary nodules. No pleural effusion or pneumothorax. Upper  Abdomen: The visualized upper abdomen is within normal limits. Musculoskeletal: No chest wall abnormality. No acute or significant osseous findings. Review of the MIP images confirms the above findings. IMPRESSION: Vascular: No evidence of pulmonary embolism or acute aortic syndrome. Non-Vascular: No acute intrathoracic abnormality. Ruthann Cancer, MD [facility] Electronically Signed   By: Ruthann Cancer M.D.   On: 10/11/2021 16:30   DG Chest 2 View  Result Date: 10/11/2021 CLINICAL DATA:  Chest pain and shortness of breath EXAM: CHEST - 2 VIEW COMPARISON:  05/03/2018 FINDINGS: Artifact overlies the chest. Heart size is normal. Mediastinal shadows are normal. The lungs are clear. The vascularity is normal. No effusions. No significant bone finding. IMPRESSION: No active cardiopulmonary disease. Electronically Signed   By: Nelson Chimes M.D.   On: 10/11/2021 15:10       Discharge Exam: Vitals:   10/12/21 0909 10/12/21 1144  BP: 130/71 120/70  Pulse: (!) 103 95  Resp: 20 18  Temp: 99.3 F (37.4 C) 99.1 F (37.3 C)  SpO2: 95% 93%    General: Pt is alert, awake, not in acute distress Cardiovascular: RRR, S1/S2 +, no edema Respiratory: CTA bilaterally, no wheezing, no rhonchi, no respiratory distress, no conversational dyspnea  Abdominal: Soft, NT, ND, bowel sounds + Extremities: no edema, no cyanosis Psych: Normal mood and affect, stable judgement and insight     The results of significant diagnostics from this hospitalization (including imaging, microbiology, ancillary and laboratory) are listed below for reference.     Microbiology: No results found for this or any previous visit (from the past 240 hour(s)).   Labs: BNP (last 3 results) No results for input(s): "BNP" in the last 8760 hours. Basic Metabolic Panel: Recent Labs  Lab 10/11/21 1440  NA 137  K 3.6  CL 107  CO2 24  GLUCOSE 163*  BUN 18  CREATININE 0.97   CALCIUM 9.5   Liver Function Tests: Recent Labs  Lab 10/11/21 1440  AST 29  ALT 43  ALKPHOS 71  BILITOT 0.4  PROT 7.4  ALBUMIN 4.5   Recent Labs  Lab 10/11/21 1440  LIPASE 25   No results for input(s): "AMMONIA" in the last 168 hours. CBC: Recent Labs  Lab 10/11/21 1440  WBC 5.2  HGB 17.0  HCT 48.0  MCV 87.3  PLT 223   Cardiac Enzymes: No results for input(s): "CKTOTAL", "CKMB", "CKMBINDEX", "TROPONINI" in the last 168 hours. BNP: Invalid input(s): "POCBNP" CBG: No results for input(s): "GLUCAP" in the last 168 hours. D-Dimer No results for input(s): "DDIMER" in the last 72 hours. Hgb A1c Recent Labs    10/11/21 2125  HGBA1C 5.5   Lipid Profile Recent Labs    10/12/21 0402  CHOL 195  HDL 33*  LDLCALC 142*  TRIG 101  CHOLHDL 5.9   Thyroid function studies No results for input(s): "TSH", "T4TOTAL", "T3FREE", "THYROIDAB" in the last 72 hours.  Invalid input(s): "FREET3" Anemia work up No results for input(s): "VITAMINB12", "FOLATE", "FERRITIN", "TIBC", "IRON", "RETICCTPCT" in the last 72 hours. Urinalysis    Component Value Date/Time   COLORURINE YELLOW 12/11/2019 [place] 12/11/2019 0817   LABSPEC 1.020 12/11/2019 0817   PHURINE 7.0 12/11/2019 0817   GLUCOSEU NEGATIVE 12/11/2019 0817   HGBUR NEGATIVE 12/11/2019 0817   HGBUR negative 02/15/2009 1400   BILIRUBINUR NEGATIVE 12/11/2019 0817   KETONESUR NEGATIVE 12/11/2019 0817   PROTEINUR NEGATIVE 05/10/2018 0835   UROBILINOGEN 0.2 12/11/2019 0817   NITRITE NEGATIVE 12/11/2019 0817   LEUKOCYTESUR NEGATIVE 12/11/2019 0817   Sepsis Labs Recent Labs  Lab 10/11/21 1440  WBC 5.2   Microbiology No results found for this or any previous visit (from the past 240 hour(s)).   Patient was seen and examined on the day of discharge and was found to be in stable condition. Time coordinating discharge: 35 minutes including assessment and coordination of care, as well as examination  of the patient.   SIGNED:  Dessa Phi, DO [facility] 10/12/2021, 2:37 PM

## 2021-10-27 ENCOUNTER — Encounter (HOSPITAL_COMMUNITY): Payer: Self-pay | Admitting: Psychiatry

## 2021-10-27 ENCOUNTER — Telehealth (HOSPITAL_BASED_OUTPATIENT_CLINIC_OR_DEPARTMENT_OTHER): Payer: Medicare Other | Admitting: Psychiatry

## 2021-10-27 DIAGNOSIS — F411 Generalized anxiety disorder: Secondary | ICD-10-CM | POA: Diagnosis not present

## 2021-10-27 DIAGNOSIS — F429 Obsessive-compulsive disorder, unspecified: Secondary | ICD-10-CM | POA: Diagnosis not present

## 2021-10-27 MED ORDER — VENLAFAXINE HCL ER 75 MG PO CP24: 75.0000 mg | ORAL_CAPSULE | Freq: Every day | ORAL | 1 refills | Status: DC

## 2021-10-27 MED ORDER — MIRTAZAPINE 15 MG PO TABS
15.0000 mg | ORAL_TABLET | Freq: Every day | ORAL | 1 refills | Status: DC
Start: 2021-10-27 — End: 2022-04-27

## 2021-10-27 MED ORDER — ARIPIPRAZOLE 5 MG PO TABS: 5.0000 mg | ORAL_TABLET | Freq: Every day | ORAL | 1 refills | Status: DC

## 2021-10-27 NOTE — Progress Notes (Signed)
Virtual Visit via Video Note  I connected with Bruce Ellison on 10/27/21 at  3:00 PM EDT by a video enabled telemedicine application and verified that I am speaking with the correct person using two identifiers.  Location: Patient: Home Provider: Home Office   I discussed the limitations of evaluation and management by telemedicine and the availability of in person appointments. The patient expressed understanding and agreed to proceed.  History of Present Illness: Patient is evaluated by video session.  He was recently admitted on the medical floor because of chest pain.  He is doing better.  He is not sure what triggered but he was having an dizziness, shortness of breath but he is pleased he is doing much better now his anxiety, nervousness are chronic but stable.  Thoughts are also stable.  He is sleeping better.  He does not go outside because of anxiety manageable.  Denies any irritability, anger, mania.  He does not like crowded places and does not like driving long distance.  His appetite is okay.  His weight is stable.  Past Psychiatric History:  H/O anxiety, Tic and depression since age 46.  Received treatment in [place].  Tried Geodon, Risperdal, Zoloft, Valium, Klonopin, Xanax, Prozac, Ambien, and Seroquel.  Saw psychiatrist at [place] and then in [place] and [place].  Tried Luvox but made him more irritable.  No history of suicidal attempt.     Psychiatric Specialty Exam: Physical Exam  Review of Systems  Weight 230 lb (104.3 kg).There is no height or weight on file to calculate BMI.  General Appearance: Casual  Eye Contact:  Fair  Speech:  Slow  Volume:  Decreased  Mood:  Euthymic  Affect:  Constricted  Thought Process:  Goal Directed  Orientation:  Full (Time, Place, and Person)  Thought Content:  Logical  Suicidal Thoughts:  No  Homicidal Thoughts:  No  Memory:  Immediate;   Good Recent;   Fair Remote;   Fair  Judgement:  Intact  Insight:   Present  Psychomotor Activity:  Decreased  Concentration:  Concentration: Fair and Attention Span: Fair  Recall:  [employer] of Knowledge:  Good  Language:  Good  Akathisia:  No  Handed:  Right  AIMS (if indicated):     Assets:  Communication Skills Desire for Improvement Housing Transportation  ADL's:  Intact  Cognition:  WNL  Sleep:   ok      Assessment and Plan:  Obsessive compulsive disorder.  Generalized anxiety disorder.  I reviewed blood work results from recent hospitalization.  Patient feeling better now.  He wants to keep his current medication.  Continue mirtazapine 15 mg at bedtime, Abilify 5 mg daily and Effexor 75 mg daily.  Recommended to call us back if he has any question or any concern.  Follow-up in 3 months.  Follow Up Instructions:    I discussed the assessment and treatment plan with the patient. The patient was provided an opportunity to ask questions and all were answered. The patient agreed with the plan and demonstrated an understanding of the instructions.   The patient was advised to call back or seek an in-person evaluation if the symptoms worsen or if the condition fails to improve as anticipated.  Collaboration of Care: Other provider involved in patient's care AEB notes are available in epic to review.  Patient/Guardian was advised Release of Information must be obtained prior to any record release in order to collaborate their care with an  outside provider. Patient/Guardian was advised if they have not already done so to contact the registration department to sign all necessary forms in order for [place] to release information regarding their care.   Consent: Patient/Guardian gives verbal consent for treatment and assignment of benefits for services provided during this visit. Patient/Guardian expressed understanding and agreed to proceed.    I provided 15 minutes of non-face-to-face time during this encounter.   Kathlee Nations, MD

## 2021-11-03 ENCOUNTER — Encounter: Payer: Self-pay | Admitting: Internal Medicine

## 2021-11-03 NOTE — Telephone Encounter (Signed)
Pt has Medicare for primary.Marland KitchenJohny Ellison

## 2021-11-06 ENCOUNTER — Other Ambulatory Visit: Payer: Self-pay | Admitting: Internal Medicine

## 2021-11-06 DIAGNOSIS — R5383 Other fatigue: Secondary | ICD-10-CM

## 2021-11-08 ENCOUNTER — Other Ambulatory Visit (INDEPENDENT_AMBULATORY_CARE_PROVIDER_SITE_OTHER): Payer: Medicare Other

## 2021-11-08 DIAGNOSIS — R5383 Other fatigue: Secondary | ICD-10-CM | POA: Diagnosis not present

## 2021-11-08 LAB — COMPREHENSIVE METABOLIC PANEL
ALT: 48 U/L (ref 0–53)
AST: 26 U/L (ref 0–37)
Albumin: 4.4 g/dL (ref 3.5–5.2)
Alkaline Phosphatase: 70 U/L (ref 39–117)
BUN: 12 mg/dL (ref 6–23)
CO2: 27 mEq/L (ref 19–32)
Calcium: 9.3 mg/dL (ref 8.4–10.5)
Chloride: 104 mEq/L (ref 96–112)
Creatinine, Ser: 0.96 mg/dL (ref 0.40–1.50)
GFR: 98.37 mL/min (ref >60.00)
Glucose, Bld: 99 mg/dL (ref 70–99)
Potassium: 3.8 mEq/L (ref 3.5–5.1)
Sodium: 139 mEq/L (ref 135–145)
Total Bilirubin: 0.7 mg/dL (ref 0.2–1.2)
Total Protein: 7.6 g/dL (ref 6.0–8.3)

## 2021-11-08 LAB — TSH: TSH: 0.97 u[IU]/mL (ref 0.35–5.50)

## 2021-11-22 ENCOUNTER — Ambulatory Visit: Payer: Medicare Other | Admitting: Internal Medicine

## 2021-11-22 ENCOUNTER — Encounter: Payer: Self-pay | Admitting: Internal Medicine

## 2021-11-22 ENCOUNTER — Ambulatory Visit (INDEPENDENT_AMBULATORY_CARE_PROVIDER_SITE_OTHER): Payer: Medicare Other | Admitting: Internal Medicine

## 2021-11-22 DIAGNOSIS — F419 Anxiety disorder, unspecified: Secondary | ICD-10-CM

## 2021-11-22 DIAGNOSIS — F332 Major depressive disorder, recurrent severe without psychotic features: Secondary | ICD-10-CM | POA: Diagnosis not present

## 2021-11-22 DIAGNOSIS — E559 Vitamin D deficiency, unspecified: Secondary | ICD-10-CM

## 2021-11-22 DIAGNOSIS — R0789 Other chest pain: Secondary | ICD-10-CM | POA: Diagnosis not present

## 2021-11-22 DIAGNOSIS — R739 Hyperglycemia, unspecified: Secondary | ICD-10-CM | POA: Diagnosis not present

## 2021-11-22 LAB — COMPREHENSIVE METABOLIC PANEL
ALT: 43 U/L (ref 0–53)
AST: 28 U/L (ref 0–37)
Albumin: 4.4 g/dL (ref 3.5–5.2)
Alkaline Phosphatase: 72 U/L (ref 39–117)
BUN: 15 mg/dL (ref 6–23)
CO2: 27 mEq/L (ref 19–32)
Calcium: 9.9 mg/dL (ref 8.4–10.5)
Chloride: 103 mEq/L (ref 96–112)
Creatinine, Ser: 1.04 mg/dL (ref 0.40–1.50)
GFR: 89.34 mL/min (ref >60.00)
Glucose, Bld: 119 mg/dL — ABNORMAL HIGH (ref 70–99)
Potassium: 3.8 mEq/L (ref 3.5–5.1)
Sodium: 137 mEq/L (ref 135–145)
Total Bilirubin: 0.8 mg/dL (ref 0.2–1.2)
Total Protein: 7.7 g/dL (ref 6.0–8.3)

## 2021-11-22 LAB — HEMOGLOBIN A1C: Hgb A1c MFr Bld: 5.7 % (ref 4.6–6.5)

## 2021-11-22 MED ORDER — DESONIDE 0.05 % EX CREA: 1.0000 | TOPICAL_CREAM | Freq: Every day | CUTANEOUS | 1 refills | Status: DC | PRN

## 2021-11-22 NOTE — Assessment & Plan Note (Signed)
F/u w/Dr Anne Hahn is on disability Cont on Abilify, Mirtazapine, Effexor

## 2021-11-22 NOTE — Assessment & Plan Note (Signed)
Check A1c. Loose wt

## 2021-11-22 NOTE — Progress Notes (Signed)
Subjective:  Patient ID: Bruce Ellison, male    DOB: Mar 05, 1981  Age: 41 y.o. MRN: [id number]  CC: Annual Exam   HPI Bruce Ellison presents for neck pain on B sides x long time, depressive disorder, polycythemia, SD  Outpatient Medications Prior to Visit  Medication Sig Dispense Refill   ARIPiprazole (ABILIFY) 5 MG tablet Take 1 tablet (5 mg total) by mouth daily. 90 tablet 1   Cholecalciferol (VITAMIN D3) 50 MCG (2000 UT) capsule Take 1 capsule (2,000 Units total) by mouth daily. 100 capsule 3   mirtazapine (REMERON) 15 MG tablet Take 1 tablet (15 mg total) by mouth at bedtime. 90 tablet 1   Multiple Vitamin (MULTIVITAMIN WITH MINERALS) TABS Take 1 tablet by mouth every morning.     venlafaxine XR (EFFEXOR-XR) 75 MG 24 hr capsule Take 1 capsule (75 mg total) by mouth daily with breakfast. 90 capsule 1   desonide (DESOWEN) 0.05 % cream Apply 1 application topically daily as needed (dermatitis). 60 g 1   No facility-administered medications prior to visit.    ROS: Review of Systems  Constitutional:  Negative for appetite change, fatigue and unexpected weight change.  HENT:  Negative for congestion, nosebleeds, sneezing, sore throat and trouble swallowing.   Eyes:  Negative for itching and visual disturbance.  Respiratory:  Negative for cough.   Cardiovascular:  Negative for chest pain, palpitations and leg swelling.  Gastrointestinal:  Negative for abdominal distention, blood in stool, diarrhea and nausea.  Genitourinary:  Negative for frequency and hematuria.  Musculoskeletal:  Positive for neck pain and neck stiffness. Negative for back pain, gait problem and joint swelling.  Skin:  Positive for rash.  Neurological:  Negative for dizziness, tremors, speech difficulty and weakness.  Psychiatric/Behavioral:  Negative for agitation, dysphoric mood, sleep disturbance and suicidal ideas. The patient is not nervous/anxious.     Objective:  BP 122/80 (BP Location: Left Arm)   Pulse 96    Temp 99.3 F (37.4 C) (Oral)   Ht '5\' 6"'$  (1.676 m)   Wt 235 lb 12.8 oz (107 kg)   SpO2 97%   BMI 38.06 kg/m   BP Readings from Last 3 Encounters:  11/22/21 122/80  10/12/21 120/70  12/11/19 120/80    Wt Readings from Last 3 Encounters:  11/22/21 235 lb 12.8 oz (107 kg)  10/11/21 231 lb 7.7 oz (105 kg)  12/11/19 216 lb (98 kg)    Physical Exam Constitutional:      General: He is not in acute distress.    Appearance: He is well-developed. He is obese.     Comments: NAD  Eyes:     Conjunctiva/sclera: Conjunctivae normal.     Pupils: Pupils are equal, round, and reactive to light.  Neck:     Thyroid: No thyromegaly.     Vascular: No JVD.  Cardiovascular:     Rate and Rhythm: Normal rate and regular rhythm.     Heart sounds: Normal heart sounds. No murmur heard.    No friction rub. No gallop.  Pulmonary:     Effort: Pulmonary effort is normal. No respiratory distress.     Breath sounds: Normal breath sounds. No wheezing or rales.  Chest:     Chest wall: No tenderness.  Abdominal:     General: Bowel sounds are normal. There is no distension.     Palpations: Abdomen is soft. There is no mass.     Tenderness: There is no abdominal tenderness. There is no guarding  or rebound.  Musculoskeletal:        General: No tenderness. Normal range of motion.     Cervical back: Normal range of motion.  Lymphadenopathy:     Cervical: No cervical adenopathy.  Skin:    General: Skin is warm and dry.     Findings: No rash.  Neurological:     Mental Status: He is alert and oriented to person, place, and time.     Cranial Nerves: No cranial nerve deficit.     Motor: No abnormal muscle tone.     Coordination: Coordination normal.     Gait: Gait normal.     Deep Tendon Reflexes: Reflexes are normal and symmetric.  Psychiatric:        Behavior: Behavior normal.        Thought Content: Thought content normal.        Judgment: Judgment normal.     Lab Results  Component Value  Date   WBC 5.2 10/11/2021   HGB 17.0 10/11/2021   HCT 48.0 10/11/2021   PLT 223 10/11/2021   GLUCOSE 99 11/08/2021   CHOL 195 10/12/2021   TRIG 101 10/12/2021   HDL 33 (L) 10/12/2021   LDLDIRECT 136.9 08/30/2010   LDLCALC 142 (H) 10/12/2021   ALT 48 11/08/2021   AST 26 11/08/2021   NA 139 11/08/2021   K 3.8 11/08/2021   CL 104 11/08/2021   CREATININE 0.96 11/08/2021   BUN 12 11/08/2021   CO2 27 11/08/2021   TSH 0.97 11/08/2021   PSA 0.65 03/10/2015   HGBA1C 5.5 10/11/2021    ECHOCARDIOGRAM COMPLETE  Result Date: 10/12/2021    ECHOCARDIOGRAM REPORT   Patient Name:   Bruce Ellison Date of Exam: 10/12/2021 Medical Rec #:  [id number]    Height:       66.0 in Accession #:    [id number]   Weight:       231.5 lb Date of Birth:  November 21, 1980    BSA:          2.128 m Patient Age:    31 years     BP:           120/70 mmHg Patient Gender: M            HR:           82 bpm. Exam Location:  Inpatient Procedure: 2D Echo, Cardiac Doppler and Color Doppler Indications:    R07.9 Chest Pain  History:        Patient has no prior history of Echocardiogram examinations.                 Risk Factors:Hypertension and Dyslipidemia. Sleep apnea,                 Tourette's & OCD (obsessive compulsive disorder) (From Hx).  Sonographer:    Alvino Chapel RCS Referring Phys: [id number] [place]  1. Left ventricular ejection fraction, by estimation, is 60 to 65%. The left ventricle has normal function. The left ventricle has no regional wall motion abnormalities. Left ventricular diastolic parameters were normal.  2. Right ventricular systolic function is normal. The right ventricular size is normal.  3. The mitral valve is normal in structure. Trivial mitral valve regurgitation. No evidence of mitral stenosis.  4. The aortic valve is normal in structure. Aortic valve regurgitation is not visualized. No aortic stenosis is present.  5. The inferior vena cava is normal in size with greater than 50% respiratory  variability, suggesting right atrial pressure of 3 mmHg. FINDINGS  Left Ventricle: Left ventricular ejection fraction, by estimation, is 60 to 65%. The left ventricle has normal function. The left ventricle has no regional wall motion abnormalities. The left ventricular internal cavity size was normal in size. There is  no left ventricular hypertrophy. Left ventricular diastolic parameters were normal. Right Ventricle: The right ventricular size is normal. No increase in right ventricular wall thickness. Right ventricular systolic function is normal. Left Atrium: Left atrial size was normal in size. Right Atrium: Right atrial size was normal in size. Pericardium: There is no evidence of pericardial effusion. Mitral Valve: The mitral valve is normal in structure. Trivial mitral valve regurgitation. No evidence of mitral valve stenosis. Tricuspid Valve: The tricuspid valve is normal in structure. Tricuspid valve regurgitation is trivial. No evidence of tricuspid stenosis. Aortic Valve: The aortic valve is normal in structure. Aortic valve regurgitation is not visualized. No aortic stenosis is present. Pulmonic Valve: The pulmonic valve was not well visualized. Pulmonic valve regurgitation is trivial. No evidence of pulmonic stenosis. Aorta: The aortic root is normal in size and structure. Venous: The inferior vena cava is normal in size with greater than 50% respiratory variability, suggesting right atrial pressure of 3 mmHg. IAS/Shunts: No atrial level shunt detected by color flow Doppler.  LEFT VENTRICLE PLAX 2D LVIDd:         4.50 cm   Diastology LVIDs:         3.00 cm   LV e' medial:    9.48 cm/s LV PW:         0.90 cm   LV E/e' medial:  9.3 LV IVS:        0.90 cm   LV e' lateral:   17.30 cm/s LVOT diam:     2.20 cm   LV E/e' lateral: 5.1 LV SV:         75 LV SV Index:   35 LVOT Area:     3.80 cm  RIGHT VENTRICLE RV S prime:     14.90 cm/s TAPSE (M-mode): 2.7 cm LEFT ATRIUM             Index        RIGHT ATRIUM            Index LA diam:        4.00 cm 1.88 cm/m   RA Area:     15.40 cm LA Vol (A2C):   56.5 ml 26.55 ml/m  RA Volume:   34.60 ml  16.26 ml/m LA Vol (A4C):   46.1 ml 21.66 ml/m LA Biplane Vol: 51.6 ml 24.25 ml/m  AORTIC VALVE LVOT Vmax:   117.00 cm/s LVOT Vmean:  72.600 cm/s LVOT VTI:    0.198 m  AORTA Ao Root diam: 3.40 cm MITRAL VALVE MV Area (PHT): 3.77 cm    SHUNTS MV Decel Time: 201 msec    Systemic VTI:  0.20 m MV E velocity: 87.70 cm/s  Systemic Diam: 2.20 cm MV A velocity: 58.60 cm/s MV E/A ratio:  1.50 Kardie Tobb DO Electronically signed by Berniece Salines DO Signature Date/Time: 10/12/2021/2:22:37 PM    Final    CT Angio Chest PE W and/or Wo Contrast  Result Date: 10/11/2021 CLINICAL DATA:  41 year old male with left-sided chest pain radiating to the back. EXAM: CT ANGIOGRAPHY CHEST WITH CONTRAST TECHNIQUE: Multidetector CT imaging of the chest was performed using the standard protocol during bolus administration of intravenous contrast. Multiplanar CT image reconstructions  and MIPs were obtained to evaluate the vascular anatomy. RADIATION DOSE REDUCTION: This exam was performed according to the departmental dose-optimization program which includes automated exposure control, adjustment of the mA and/or kV according to patient size and/or use of iterative reconstruction technique. CONTRAST:  Seventy-five mL Omnipaque 350, intravenous COMPARISON:  None Available. FINDINGS: Cardiovascular: Satisfactory opacification of the pulmonary arteries to the segmental level. No evidence of pulmonary embolism. Normal heart size. No pericardial effusion. Evidence of acute aortic abnormality. Mediastinum/Nodes: No enlarged mediastinal, hilar, or axillary lymph nodes. Thyroid gland, trachea, and esophagus demonstrate no significant findings. Lungs/Pleura: No focal consolidations. No suspicious pulmonary nodules. No pleural effusion or pneumothorax. Upper Abdomen: The visualized upper abdomen is within normal limits.  Musculoskeletal: No chest wall abnormality. No acute or significant osseous findings. Review of the MIP images confirms the above findings. IMPRESSION: Vascular: No evidence of pulmonary embolism or acute aortic syndrome. Non-Vascular: No acute intrathoracic abnormality. Ruthann Cancer, MD [facility] Electronically Signed   By: Ruthann Cancer M.D.   On: 10/11/2021 16:30   DG Chest 2 View  Result Date: 10/11/2021 CLINICAL DATA:  Chest pain and shortness of breath EXAM: CHEST - 2 VIEW COMPARISON:  05/03/2018 FINDINGS: Artifact overlies the chest. Heart size is normal. Mediastinal shadows are normal. The lungs are clear. The vascularity is normal. No effusions. No significant bone finding. IMPRESSION: No active cardiopulmonary disease. Electronically Signed   By: Nelson Chimes M.D.   On: 10/11/2021 15:10    Assessment & Plan:   Problem List Items Addressed This Visit     Anxiety    F/u w/Dr Anne Hahn is on disability Cont on Abilify, Mirtazapine, Effexor      Chest pain, atypical    Recurrent, last in 10/2021: Chest CT angio and ECHO - [place]      Hyperglycemia    Check A1c. Loose wt      Relevant Orders   Comprehensive metabolic panel   Hemoglobin A1c   Severe episode of recurrent major depressive disorder (HCC)    F/u w/Dr Anne Hahn is on disability Cont on Abilify, Mirtazapine, Effexor      Vitamin D deficiency    Cont on Vit D         Meds ordered this encounter  Medications   desonide (DESOWEN) 0.05 % cream    Sig: Apply 1 Application topically daily as needed (dermatitis).    Dispense:  30 g    Refill:  1      Follow-up: Return in about 6 months (around 05/23/2022) for a follow-up visit.  Walker Kehr, MD

## 2021-11-22 NOTE — Assessment & Plan Note (Signed)
Cont on Vit D 

## 2021-11-22 NOTE — Assessment & Plan Note (Signed)
Recurrent, last in 10/2021: Chest CT angio and ECHO - [place]

## 2021-11-24 ENCOUNTER — Encounter: Payer: Self-pay | Admitting: Internal Medicine

## 2021-11-25 NOTE — Telephone Encounter (Signed)
Rec'd determination med was DENIED. This drug is not covered on the formulary. Alternatives are : desonide external ointment 0.05%, Betamethasone Dipropionate aug external (cream, gel, lotion, and ointment) 0.05 %, Betamethasone Dipropionate external (lotion, ointment, and cream) 0.05 %, Betamethasone Valerate external (cream, lotion, and ointment) 0.1 %, clobetasol propionate external cream 0.05%.

## 2021-11-25 NOTE — Telephone Encounter (Signed)
Submitted PA for desonide cream w/ (Key: [id number]) Rec'd msg Your information has been sent to [facility]...Johny Chess

## 2021-11-28 ENCOUNTER — Other Ambulatory Visit: Payer: Self-pay | Admitting: Internal Medicine

## 2021-11-28 MED ORDER — BETAMETHASONE DIPROPIONATE 0.05 % EX CREA: TOPICAL_CREAM | Freq: Two times a day (BID) | CUTANEOUS | 1 refills | Status: AC | PRN

## 2021-11-28 NOTE — Progress Notes (Signed)
Prescription change

## 2021-11-30 ENCOUNTER — Ambulatory Visit: Payer: Medicare Other | Admitting: Internal Medicine

## 2022-02-17 ENCOUNTER — Ambulatory Visit: Payer: Medicare Other | Admitting: Nurse Practitioner

## 2022-04-18 ENCOUNTER — Ambulatory Visit (INDEPENDENT_AMBULATORY_CARE_PROVIDER_SITE_OTHER): Payer: Medicare Other | Admitting: Internal Medicine

## 2022-04-18 ENCOUNTER — Encounter: Payer: Self-pay | Admitting: Internal Medicine

## 2022-04-18 VITALS — BP 124/62 | HR 85 | Ht 66.0 in | Wt 232.0 lb

## 2022-04-18 DIAGNOSIS — Z86010 Personal history of colonic polyps: Secondary | ICD-10-CM | POA: Diagnosis not present

## 2022-04-18 DIAGNOSIS — R195 Other fecal abnormalities: Secondary | ICD-10-CM

## 2022-04-18 DIAGNOSIS — K602 Anal fissure, unspecified: Secondary | ICD-10-CM | POA: Diagnosis not present

## 2022-04-18 MED ORDER — DILTIAZEM GEL 2 %: 1.0000 | Freq: Two times a day (BID) | CUTANEOUS | 0 refills | Status: AC

## 2022-04-18 NOTE — Patient Instructions (Addendum)
We have sent a prescription for Diltiazem gel to [facility]. Apply a pea size amount 1/2 to 1 inch inside rectum 2  times daily.  [facility]'s information is below: Address: [address], [place], [place] [address]  [phone]  *Please DO NOT go directly from our office to pick up this medication! Give the pharmacy 1 day to process the prescription as this is compounded and takes time to make.   It has been recommended to you by your physician that you have a(n) Colonoscopy completed. Per your request, we did not schedule the procedure(s) today. Please contact our office at [phone] should you decide to have the procedure completed. You will be scheduled for a pre-visit and procedure at that time.  _______________________________________________________  If your blood pressure at your visit was 140/90 or greater, please contact your primary care physician to follow up on this.  _______________________________________________________  If you are age 39 or older, your body mass index should be between 23-30. Your Body mass index is 37.45 kg/m. If this is out of the aforementioned range listed, please consider follow up with your Primary Care Provider.  If you are age 27 or younger, your body mass index should be between 19-25. Your Body mass index is 37.45 kg/m. If this is out of the aformentioned range listed, please consider follow up with your Primary Care Provider.   ________________________________________________________  The [facility] providers would like to encourage you to use [facility] to communicate with providers for non-urgent requests or questions.  Due to long hold times on the telephone, sending your provider a message by [facility] may be a faster and more efficient way to get a response.  Please allow 48 business hours for a response.  Please remember that this is for non-urgent requests.   _______________________________________________________  Thank you for choosing me and [facility].  Dr.Jay Pyrtle

## 2022-04-18 NOTE — Progress Notes (Signed)
HPI: Bruce Ellison is a 42 year old male with a history of adenomatous colon polyp, chronic diarrhea, hypertension, sleep apnea, OCD and anxiety who is seen to evaluate anorectal pain and bleeding.  He is here alone today.  He is known to me from evaluation in 2012, 2015 and 2016 though he has not been seen since 2016.  He had a colonoscopy which I performed on 11/13/2013 to evaluate chronic diarrhea.  A small adenoma was removed from the sigmoid.  Random biopsies were negative for microscopic colitis.  Today he reports that 6 weeks ago he developed anorectal pain with defecation as well as bleeding.  There was throbbing after he had a bowel movement.  This was not related to a hard stool.  The bleeding has stopped but he still has some discomfort as stool passes.  He has diarrhea which seems to come and go but no longer explosive or watery.  Usually about 4 bowel movements per day.  No accidents.  Occasionally stools will be formed but predominantly loose.  Good appetite.  No abdominal pain.  Occasional heartburn but not a daily problem.  Denies dysphagia and odynophagia.  Past Medical History:  Diagnosis Date   Adenomatous colon polyp    tubular   Allergic rhinitis    Anxiety    Depression    GERD (gastroesophageal reflux disease)    Glucose intolerance (impaired glucose tolerance)    HTN (hypertension)    borderline   Insomnia    Obesity    OCD (obsessive compulsive disorder)    Personality disorder (HCC)    with schizoid features   Sleep apnea    Tourette's    per [facility] neuro   Varicose veins of both lower extremities    Vitamin D deficiency     Past Surgical History:  Procedure Laterality Date   adnoidectomy  1992    Outpatient Medications Prior to Visit  Medication Sig Dispense Refill   ARIPiprazole (ABILIFY) 5 MG tablet Take 1 tablet (5 mg total) by mouth daily. 90 tablet 1   betamethasone dipropionate 0.05 % cream Apply topically 2 (two) times daily as needed. 30 g 1    Cholecalciferol (VITAMIN D3) 50 MCG (2000 UT) capsule Take 1 capsule (2,000 Units total) by mouth daily. 100 capsule 3   mirtazapine (REMERON) 15 MG tablet Take 1 tablet (15 mg total) by mouth at bedtime. 90 tablet 1   Multiple Vitamin (MULTIVITAMIN WITH MINERALS) TABS Take 1 tablet by mouth every morning.     venlafaxine XR (EFFEXOR-XR) 75 MG 24 hr capsule Take 1 capsule (75 mg total) by mouth daily with breakfast. 90 capsule 1   No facility-administered medications prior to visit.    Allergies  Allergen Reactions   Honey Bee Treatment [Bee Venom] Anaphylaxis, Hives and Rash    Pt reports he does not go into anaphylaxis with bee stings, just honey   Penicillins Hives, Shortness Of Breath and Rash    Did it involve swelling of the face/tongue/throat, SOB, or low BP? no Did it involve sudden or severe rash/hives, skin peeling, or any reaction on the inside of your mouth or nose? yes Did you need to seek medical attention at a hospital or doctor's office? yes When did it last happen?      1995 If all above answers are "NO", may proceed with cephalosporin use.    Sulfa Antibiotics Hives, Shortness Of Breath, Rash and Other (See Comments)    "Feels flushed"   Doxycycline Other (See Comments)  dizziness   Morphine Other (See Comments)    Pt doesn't like side effects   Quetiapine Hives, Rash and Other (See Comments)    restlessness   Sulfamethoxazole Rash and Hives    Family History  Problem Relation Age of Onset   Depression Mother    Bipolar disorder Father    Colon cancer Other    Cholelithiasis Maternal Grandmother    Diabetes Maternal Grandmother    Prostate cancer Maternal Grandfather    Colon polyps Maternal Grandfather    Esophageal cancer Neg Hx    Rectal cancer Neg Hx    Stomach cancer Neg Hx     Social History   Tobacco Use   Smoking status: Never   Smokeless tobacco: Never  Vaping Use   Vaping Use: Never used  Substance Use Topics   Alcohol use: No     Alcohol/week: 0.0 standard drinks of alcohol   Drug use: No    ROS: As per history of present illness, otherwise negative  BP 124/62   Pulse 85   Ht 5' 6"$  (1.676 m)   Wt 232 lb (105.2 kg)   BMI 37.45 kg/m  Gen: awake, alert, NAD HEENT: anicteric  Abd: soft, NT/ND, +BS throughout Rectal: Normal external exam, no rash or external hemorrhoid; posterior anal fissure with tenderness which limited full internal rectal exam; no palpable masses Ext: no c/c/e Neuro: nonfocal  RELEVANT LABS AND IMAGING: CBC    Component Value Date/Time   WBC 5.2 10/11/2021 1440   RBC 5.50 10/11/2021 1440   HGB 17.0 10/11/2021 1440   HCT 48.0 10/11/2021 1440   PLT 223 10/11/2021 1440   MCV 87.3 10/11/2021 1440   MCH 30.9 10/11/2021 1440   MCHC 35.4 10/11/2021 1440   RDW 12.8 10/11/2021 1440   LYMPHSABS 1.2 12/11/2019 0817   MONOABS 0.4 12/11/2019 0817   EOSABS 0.2 12/11/2019 0817   BASOSABS 0.0 12/11/2019 0817    CMP     Component Value Date/Time   NA 137 11/22/2021 0825   K 3.8 11/22/2021 0825   CL 103 11/22/2021 0825   CO2 27 11/22/2021 0825   GLUCOSE 119 (H) 11/22/2021 0825   BUN 15 11/22/2021 0825   CREATININE 1.04 11/22/2021 0825   CALCIUM 9.9 11/22/2021 0825   PROT 7.7 11/22/2021 0825   ALBUMIN 4.4 11/22/2021 0825   AST 28 11/22/2021 0825   ALT 43 11/22/2021 0825   ALKPHOS 72 11/22/2021 0825   BILITOT 0.8 11/22/2021 0825   GFRNONAA >60 10/11/2021 1440   GFRAA >60 05/10/2018 0835    ASSESSMENT/PLAN: 42 year old male with a history of adenomatous colon polyp, chronic diarrhea, hypertension, sleep apnea, OCD and anxiety who is seen to evaluate anorectal pain and bleeding.   Anal fissure --we discussed anal fissure today including treatment. -- Diltiazem gel 2%; apply a pea-sized amount of medication inserted into the anal canal to the first knuckle twice daily for 4 to 6 weeks.  I advised that he use the medication at least 1 week longer once symptoms are completely absent. --  Follow-up if symptoms fail to heal  2.  History of adenoma of the colon --surveillance colonoscopy is overdue.  His last exam was in September 2015; surveillance would be recommended at current guidelines in September 2022.  I recommended colonoscopy today which we discussed but he does not wish to proceed -- Surveillance colonoscopy recommended, patient declines -- I advised him to think about this and call me back if he wishes to proceed  3.  Chronic loose stool --it sounds like diarrhea is less of an issue for him than it was years ago.  He can use loperamide on an as-needed basis.      [id number], Evie Lacks, [address] [place],  [place] [address]

## 2022-04-27 ENCOUNTER — Telehealth (HOSPITAL_BASED_OUTPATIENT_CLINIC_OR_DEPARTMENT_OTHER): Payer: Medicare Other | Admitting: Psychiatry

## 2022-04-27 ENCOUNTER — Encounter (HOSPITAL_COMMUNITY): Payer: Self-pay | Admitting: Psychiatry

## 2022-04-27 DIAGNOSIS — F411 Generalized anxiety disorder: Secondary | ICD-10-CM | POA: Diagnosis not present

## 2022-04-27 DIAGNOSIS — F429 Obsessive-compulsive disorder, unspecified: Secondary | ICD-10-CM | POA: Diagnosis not present

## 2022-04-27 MED ORDER — ARIPIPRAZOLE 5 MG PO TABS: 5.0000 mg | ORAL_TABLET | Freq: Every day | ORAL | 1 refills | Status: DC

## 2022-04-27 MED ORDER — MIRTAZAPINE 15 MG PO TABS
15.0000 mg | ORAL_TABLET | Freq: Every day | ORAL | 1 refills | Status: DC
Start: 2022-04-27 — End: 2022-10-30

## 2022-04-27 MED ORDER — VENLAFAXINE HCL ER 75 MG PO CP24: 75.0000 mg | ORAL_CAPSULE | Freq: Every day | ORAL | 1 refills | Status: DC

## 2022-04-27 NOTE — Progress Notes (Signed)
Health MD Virtual Progress Note   Patient Location: Home Provider Location: Office  I connect with patient by video and verified that I am speaking with correct person by using two identifiers. I discussed the limitations of evaluation and management by telemedicine and the availability of in person appointments. I also discussed with the patient that there may be a patient responsible charge related to this service. The patient expressed understanding and agreed to proceed.  Bruce Ellison [id number] 42 y.o.  04/27/2022 1:59 PM    History of Present Illness:  Patient is evaluated by video session.  He is taking all his medication as prescribed.  He reported symptoms are stable and he does not feel worsening.  He denies any recent chest pain, dizziness or any panic attack.  He does go outside for a walk but still feels nervous and anxious around people.  His OCD symptoms are stable.  He has no tremor or shakes or any EPS.  He does not want to change the medication.  His appetite is okay and his weight is stable.  Recently had a blood work and his hemoglobin A1c is stable.  He denies any hallucination, paranoia or any suicidal thoughts.  He lives with his mother.  He denies drinking or using any illegal substances.  Past Psychiatric History:  H/O anxiety, Tic and depression since age 77.  Received treatment in [place].  Tried Geodon, Risperdal, Zoloft, Valium, Klonopin, Xanax, Prozac, Ambien, and Seroquel.  Saw psychiatrist at [facility] and then in [place] and [place].  Tried Luvox but made him more irritable.  No history of suicidal attempt.     Outpatient Encounter Medications as of 04/27/2022  Medication Sig   ARIPiprazole (ABILIFY) 5 MG tablet Take 1 tablet (5 mg total) by mouth daily.   betamethasone dipropionate 0.05 % cream Apply topically 2 (two) times daily as needed.   Cholecalciferol (VITAMIN D3) 50 MCG (2000 UT) capsule Take 1 capsule (2,000  Units total) by mouth daily.   diltiazem 2 % GEL Apply 1 Application topically 2 (two) times daily.   mirtazapine (REMERON) 15 MG tablet Take 1 tablet (15 mg total) by mouth at bedtime.   Multiple Vitamin (MULTIVITAMIN WITH MINERALS) TABS Take 1 tablet by mouth every morning.   venlafaxine XR (EFFEXOR-XR) 75 MG 24 hr capsule Take 1 capsule (75 mg total) by mouth daily with breakfast.   No facility-administered encounter medications on file as of 04/27/2022.    No results found for this or any previous visit (from the past 2160 hour(s)).   Psychiatric Specialty Exam: Physical Exam  Review of Systems  Weight 230 lb (104.3 kg).There is no height or weight on file to calculate BMI.  General Appearance: Casual  Eye Contact:  Fair  Speech:  Clear and Coherent and Normal Rate  Volume:  Normal  Mood:  Euthymic  Affect:  Flat  Thought Process:  Goal Directed  Orientation:  Full (Time, Place, and Person)  Thought Content:  Logical  Suicidal Thoughts:  No  Homicidal Thoughts:  No  Memory:  Immediate;   Good Recent;   Good Remote;   Good  Judgement:  Good  Insight:  Present  Psychomotor Activity:  Normal  Concentration:  Concentration: Fair and Attention Span: Fair  Recall:  Good  Fund of Knowledge:  Good  Language:  Good  Akathisia:  No  Handed:  Right  AIMS (if indicated):     Assets:  Communication Skills Desire  for Improvement Housing Social Support Transportation  ADL's:  Intact  Cognition:  WNL  Sleep:  ok     Assessment/Plan: GAD (generalized anxiety disorder) - Plan: venlafaxine XR (EFFEXOR-XR) 75 MG 24 hr capsule, mirtazapine (REMERON) 15 MG tablet  Obsessive-compulsive disorder, unspecified type - Plan: mirtazapine (REMERON) 15 MG tablet, ARIPiprazole (ABILIFY) 5 MG tablet  Patient's conditions and symptoms are stable.  He has no major concern from the medication.  Continue mirtazapine 15 mg at bedtime, Abilify 5 mg daily and Effexor 75 mg daily.  He is not  interested in therapy.  Recommended to call us back if is any question or any concern.  Follow-up in 3 months.   Follow Up Instructions:     I discussed the assessment and treatment plan with the patient. The patient was provided an opportunity to ask questions and all were answered. The patient agreed with the plan and demonstrated an understanding of the instructions.   The patient was advised to call back or seek an in-person evaluation if the symptoms worsen or if the condition fails to improve as anticipated.    Collaboration of Care: Other provider involved in patient's care AEB notes are available in epic to review.  Patient/Guardian was advised Release of Information must be obtained prior to any record release in order to collaborate their care with an outside provider. Patient/Guardian was advised if they have not already done so to contact the registration department to sign all necessary forms in order for [place] to release information regarding their care.   Consent: Patient/Guardian gives verbal consent for treatment and assignment of benefits for services provided during this visit. Patient/Guardian expressed understanding and agreed to proceed.     I provided 17 minutes of non face to face time during this encounter.  Kathlee Nations, MD 04/27/2022

## 2022-07-05 ENCOUNTER — Telehealth: Payer: Self-pay | Admitting: Radiology

## 2022-07-05 NOTE — Telephone Encounter (Signed)
Contacted Bruce Ellison to schedule their annual wellness visit. Patient declined to schedule AWV at this time.  Valor [place]ber[place] K. CMA

## 2022-08-31 ENCOUNTER — Encounter: Payer: Self-pay | Admitting: Internal Medicine

## 2022-10-26 ENCOUNTER — Telehealth (HOSPITAL_COMMUNITY): Payer: Medicare Other | Admitting: Psychiatry

## 2022-10-30 ENCOUNTER — Telehealth (HOSPITAL_COMMUNITY): Payer: Medicare Other | Admitting: Psychiatry

## 2022-10-30 ENCOUNTER — Encounter (HOSPITAL_COMMUNITY): Payer: Self-pay | Admitting: Psychiatry

## 2022-10-30 VITALS — Wt 230.0 lb

## 2022-10-30 DIAGNOSIS — F429 Obsessive-compulsive disorder, unspecified: Secondary | ICD-10-CM

## 2022-10-30 DIAGNOSIS — F411 Generalized anxiety disorder: Secondary | ICD-10-CM

## 2022-10-30 MED ORDER — VENLAFAXINE HCL ER 75 MG PO CP24: 75.0000 mg | ORAL_CAPSULE | Freq: Every day | ORAL | 1 refills | Status: DC

## 2022-10-30 MED ORDER — MIRTAZAPINE 15 MG PO TABS
15.0000 mg | ORAL_TABLET | Freq: Every day | ORAL | 1 refills | Status: DC
Start: 2022-10-30 — End: 2023-04-30

## 2022-10-30 MED ORDER — ARIPIPRAZOLE 5 MG PO TABS: 5.0000 mg | ORAL_TABLET | Freq: Every day | ORAL | 1 refills | Status: DC

## 2022-10-30 NOTE — Progress Notes (Signed)
[facility] MD Virtual Progress Note   Patient Location: Home Provider Location: Home Office  I connect with patient by video and verified that I am speaking with correct person by using two identifiers. I discussed the limitations of evaluation and management by telemedicine and the availability of in person appointments. I also discussed with the patient that there may be a patient responsible charge related to this service. The patient expressed understanding and agreed to proceed.  Bruce Ellison [id number] 42 y.o.  10/30/2022 4:02 PM  History of Present Illness:  Patient is evaluated by video session.  He had chronic symptoms but stable.  Sometimes he is very nervous then he started having tics.  He does have cramping, checking and tapping few times a day but he know how to manage these symptoms.  He is able to distract the symptoms by keeping himself busy.  He sleeps good.  He does go outside for a walk for some time nervous and anxious around people.  He has appointment coming up with his primary care in September.  Denies any major panic attack, crying spells or any feeling of hopelessness or worthlessness.  He does not want to change the medication.  In the past he used to take higher dose of medication but has not seen any improvement and he feels the current doses are working better and symptoms are manageable.  His appetite is okay.  His weight is stable.  He has no major concerns or side effects from the medication.  Past Psychiatric History: H/O anxiety, Tic and depression since age 23.  Received treatment in [place].  Tried Geodon, Risperdal, Zoloft, Valium, Klonopin, Xanax, Prozac, Ambien, and Seroquel.  Saw psychiatrist at [facility] and then in [place] and [place].  Tried Luvox but made him more irritable.  No history of suicidal attempt.    Outpatient Encounter Medications as of 10/30/2022  Medication Sig   ARIPiprazole (ABILIFY) 5 MG tablet Take  1 tablet (5 mg total) by mouth daily.   betamethasone dipropionate 0.05 % cream Apply topically 2 (two) times daily as needed.   Cholecalciferol (VITAMIN D3) 50 MCG (2000 UT) capsule Take 1 capsule (2,000 Units total) by mouth daily.   diltiazem 2 % GEL Apply 1 Application topically 2 (two) times daily.   mirtazapine (REMERON) 15 MG tablet Take 1 tablet (15 mg total) by mouth at bedtime.   Multiple Vitamin (MULTIVITAMIN WITH MINERALS) TABS Take 1 tablet by mouth every morning.   venlafaxine XR (EFFEXOR-XR) 75 MG 24 hr capsule Take 1 capsule (75 mg total) by mouth daily with breakfast.   No facility-administered encounter medications on file as of 10/30/2022.    No results found for this or any previous visit (from the past 2160 hour(s)).   Psychiatric Specialty Exam: Physical Exam  Review of Systems  Weight 230 lb (104.3 kg).There is no height or weight on file to calculate BMI.  General Appearance: Casual  Eye Contact:  Fair  Speech:  Slow  Volume:  Normal  Mood:  Anxious  Affect:  Appropriate  Thought Process:  Goal Directed  Orientation:  Full (Time, Place, and Person)  Thought Content:  Logical  Suicidal Thoughts:  No  Homicidal Thoughts:  No  Memory:  Immediate;   Good Recent;   Good Remote;   Good  Judgement:  Good  Insight:  Good  Psychomotor Activity:  Normal  Concentration:  Concentration: Fair and Attention Span: Fair  Recall:  Good  Fund of Knowledge:  Good  Language:  Good  Akathisia:  No  Handed:  Right  AIMS (if indicated):     Assets:  Communication Skills Desire for Improvement Housing Resilience Transportation  ADL's:  Intact  Cognition:  WNL  Sleep:  ok     Assessment/Plan: Obsessive-compulsive disorder, unspecified type - Plan: ARIPiprazole (ABILIFY) 5 MG tablet, mirtazapine (REMERON) 15 MG tablet  GAD (generalized anxiety disorder) - Plan: mirtazapine (REMERON) 15 MG tablet, venlafaxine XR (EFFEXOR-XR) 75 MG 24 hr capsule  Symptoms are  chronic but stable.  He has no concerns from the medication.  He like to keep his current medication.  His appointment coming up with his primary care in September.  Encouraged to keep that appointment.  Continue mirtazapine 15 mg at bedtime, Abilify 5 mg daily and Effexor 75 mg daily.  He is not interested in therapy.  Recommend to call us back with any question or any concern.  Follow-up in 6 months   Follow Up Instructions:     I discussed the assessment and treatment plan with the patient. The patient was provided an opportunity to ask questions and all were answered. The patient agreed with the plan and demonstrated an understanding of the instructions.   The patient was advised to call back or seek an in-person evaluation if the symptoms worsen or if the condition fails to improve as anticipated.    Collaboration of Care: Other provider involved in patient's care AEB notes are available in epic to review.  Patient/Guardian was advised Release of Information must be obtained prior to any record release in order to collaborate their care with an outside provider. Patient/Guardian was advised if they have not already done so to contact the registration department to sign all necessary forms in order for [place] to release information regarding their care.   Consent: Patient/Guardian gives verbal consent for treatment and assignment of benefits for services provided during this visit. Patient/Guardian expressed understanding and agreed to proceed.     I provided 22 minutes of non face to face time during this encounter.  Note: This document was prepared by [employer] voice dictation technology and any errors that results from this process are unintentional.    Cleotis Nipper, MD 10/30/2022

## 2022-11-13 ENCOUNTER — Ambulatory Visit: Payer: Medicare Other

## 2022-11-22 ENCOUNTER — Encounter: Payer: Self-pay | Admitting: Internal Medicine

## 2022-11-29 ENCOUNTER — Encounter: Payer: Self-pay | Admitting: Internal Medicine

## 2022-11-29 ENCOUNTER — Ambulatory Visit: Payer: Medicare Other | Admitting: Internal Medicine

## 2022-11-29 VITALS — BP 118/80 | HR 106 | Temp 98.3°F | Ht 66.0 in | Wt 221.0 lb

## 2022-11-29 DIAGNOSIS — E559 Vitamin D deficiency, unspecified: Secondary | ICD-10-CM

## 2022-11-29 DIAGNOSIS — R739 Hyperglycemia, unspecified: Secondary | ICD-10-CM

## 2022-11-29 DIAGNOSIS — K6289 Other specified diseases of anus and rectum: Secondary | ICD-10-CM

## 2022-11-29 DIAGNOSIS — E785 Hyperlipidemia, unspecified: Secondary | ICD-10-CM

## 2022-11-29 DIAGNOSIS — D751 Secondary polycythemia: Secondary | ICD-10-CM

## 2022-11-29 DIAGNOSIS — R634 Abnormal weight loss: Secondary | ICD-10-CM | POA: Diagnosis not present

## 2022-11-29 DIAGNOSIS — Z86010 Personal history of colonic polyps: Secondary | ICD-10-CM | POA: Diagnosis not present

## 2022-11-29 DIAGNOSIS — D126 Benign neoplasm of colon, unspecified: Secondary | ICD-10-CM | POA: Insufficient documentation

## 2022-11-29 LAB — LIPID PANEL
Cholesterol: 162 mg/dL (ref 0–200)
HDL: 38.4 mg/dL — ABNORMAL LOW (ref >39.00)
LDL Cholesterol: 103 mg/dL — ABNORMAL HIGH (ref 0–99)
NonHDL: 123.74
Total CHOL/HDL Ratio: 4
Triglycerides: 104 mg/dL (ref 0.0–149.0)
VLDL: 20.8 mg/dL (ref 0.0–40.0)

## 2022-11-29 LAB — COMPREHENSIVE METABOLIC PANEL
ALT: 51 U/L (ref 0–53)
AST: 31 U/L (ref 0–37)
Albumin: 4.6 g/dL (ref 3.5–5.2)
Alkaline Phosphatase: 80 U/L (ref 39–117)
BUN: 9 mg/dL (ref 6–23)
CO2: 27 mEq/L (ref 19–32)
Calcium: 9.6 mg/dL (ref 8.4–10.5)
Chloride: 105 mEq/L (ref 96–112)
Creatinine, Ser: 1.01 mg/dL (ref 0.40–1.50)
GFR: 91.87 mL/min (ref >60.00)
Glucose, Bld: 115 mg/dL — ABNORMAL HIGH (ref 70–99)
Potassium: 4.3 mEq/L (ref 3.5–5.1)
Sodium: 140 mEq/L (ref 135–145)
Total Bilirubin: 1 mg/dL (ref 0.2–1.2)
Total Protein: 7.4 g/dL (ref 6.0–8.3)

## 2022-11-29 LAB — CBC WITH DIFFERENTIAL/PLATELET
Basophils Absolute: 0 10*3/uL (ref 0.0–0.1)
Basophils Relative: 0.4 % (ref 0.0–3.0)
Eosinophils Absolute: 0.2 10*3/uL (ref 0.0–0.7)
Eosinophils Relative: 4.1 % (ref 0.0–5.0)
HCT: 50.1 % (ref 39.0–52.0)
Hemoglobin: 16.9 g/dL (ref 13.0–17.0)
Lymphocytes Relative: 26.6 % (ref 12.0–46.0)
Lymphs Abs: 1 10*3/uL (ref 0.7–4.0)
MCHC: 33.7 g/dL (ref 30.0–36.0)
MCV: 90 fl (ref 78.0–100.0)
Monocytes Absolute: 0.5 10*3/uL (ref 0.1–1.0)
Monocytes Relative: 12.5 % — ABNORMAL HIGH (ref 3.0–12.0)
Neutro Abs: 2.2 10*3/uL (ref 1.4–7.7)
Neutrophils Relative %: 56.4 % (ref 43.0–77.0)
Platelets: 261 10*3/uL (ref 150.0–400.0)
RBC: 5.57 Mil/uL (ref 4.22–5.81)
RDW: 13.4 % (ref 11.5–15.5)
WBC: 3.9 10*3/uL — ABNORMAL LOW (ref 4.0–10.5)

## 2022-11-29 LAB — URINALYSIS
Bilirubin Urine: NEGATIVE
Hgb urine dipstick: NEGATIVE
Ketones, ur: 15 — AB
Leukocytes,Ua: NEGATIVE
Nitrite: NEGATIVE
Specific Gravity, Urine: 1.025 (ref 1.000–1.030)
Total Protein, Urine: NEGATIVE
Urine Glucose: NEGATIVE
Urobilinogen, UA: 0.2 (ref 0.0–1.0)
pH: 6 (ref 5.0–8.0)

## 2022-11-29 LAB — TSH: TSH: 0.97 u[IU]/mL (ref 0.35–5.50)

## 2022-11-29 LAB — HEMOGLOBIN A1C: Hgb A1c MFr Bld: 5.4 % (ref 4.6–6.5)

## 2022-11-29 LAB — VITAMIN D 25 HYDROXY (VIT D DEFICIENCY, FRACTURES): VITD: 37.22 ng/mL (ref 30.00–100.00)

## 2022-11-29 MED ORDER — TRIAMCINOLONE ACETONIDE 0.5 % EX OINT: 1.0000 | TOPICAL_OINTMENT | Freq: Three times a day (TID) | CUTANEOUS | 1 refills | Status: AC

## 2022-11-29 MED ORDER — HYDROCORTISONE ACETATE 30 MG RE SUPP: RECTAL | 1 refills | Status: AC

## 2022-11-29 NOTE — Assessment & Plan Note (Signed)
H/o anal fissure.  Due colonoscopy w/Dr Pyrtle Anusol HC supp bid Triamc oint 0.5%

## 2022-11-29 NOTE — Assessment & Plan Note (Signed)
Due colon

## 2022-11-29 NOTE — Assessment & Plan Note (Signed)
Check TSH, other labs

## 2022-11-29 NOTE — Progress Notes (Signed)
Subjective:  Patient ID: Bruce Ellison, male    DOB: 1980/11/08  Age: 42 y.o. MRN: [id number]  CC: Follow-up   HPI Bruce Ellison presents for repeat painful rectal fissure. He saw Dr Rhea Belton for it. H/o colon polyps. Pt lost wt on diet  Outpatient Medications Prior to Visit  Medication Sig Dispense Refill   ARIPiprazole (ABILIFY) 5 MG tablet Take 1 tablet (5 mg total) by mouth daily. 90 tablet 1   betamethasone dipropionate 0.05 % cream Apply topically 2 (two) times daily as needed. 30 g 1   Cholecalciferol (VITAMIN D3) 50 MCG (2000 UT) capsule Take 1 capsule (2,000 Units total) by mouth daily. 100 capsule 3   diltiazem 2 % GEL Apply 1 Application topically 2 (two) times daily. 30 g 0   mirtazapine (REMERON) 15 MG tablet Take 1 tablet (15 mg total) by mouth at bedtime. 90 tablet 1   Multiple Vitamin (MULTIVITAMIN WITH MINERALS) TABS Take 1 tablet by mouth every morning.     venlafaxine XR (EFFEXOR-XR) 75 MG 24 hr capsule Take 1 capsule (75 mg total) by mouth daily with breakfast. 90 capsule 1   No facility-administered medications prior to visit.    ROS: Review of Systems  Constitutional:  Positive for unexpected weight change. Negative for appetite change and fatigue.  HENT:  Negative for congestion, nosebleeds, sneezing, sore throat and trouble swallowing.   Eyes:  Negative for itching and visual disturbance.  Respiratory:  Negative for cough.   Cardiovascular:  Negative for chest pain, palpitations and leg swelling.  Gastrointestinal:  Positive for anal bleeding and rectal pain. Negative for abdominal distention, blood in stool, diarrhea and nausea.  Genitourinary:  Negative for frequency and hematuria.  Musculoskeletal:  Negative for back pain, gait problem, joint swelling and neck pain.  Skin:  Negative for rash.  Neurological:  Negative for dizziness, tremors, speech difficulty and weakness.  Psychiatric/Behavioral:  Negative for agitation, dysphoric mood and sleep  disturbance. The patient is not nervous/anxious.     Objective:  BP 118/80 (BP Location: Right Arm, Patient Position: Sitting, Cuff Size: Normal)   Pulse (!) 106   Temp 98.3 F (36.8 C) (Oral)   Ht 5\' 6"  (1.676 m)   Wt 221 lb (100.2 kg)   SpO2 98%   BMI 35.67 kg/m   BP Readings from Last 3 Encounters:  11/29/22 118/80  04/18/22 124/62  11/22/21 122/80    Wt Readings from Last 3 Encounters:  11/29/22 221 lb (100.2 kg)  04/18/22 232 lb (105.2 kg)  11/22/21 235 lb 12.8 oz (107 kg)    Physical Exam Constitutional:      General: He is not in acute distress.    Appearance: He is well-developed. He is obese.     Comments: NAD  Eyes:     Conjunctiva/sclera: Conjunctivae normal.     Pupils: Pupils are equal, round, and reactive to light.  Neck:     Thyroid: No thyromegaly.     Vascular: No JVD.  Cardiovascular:     Rate and Rhythm: Normal rate and regular rhythm.     Heart sounds: Normal heart sounds. No murmur heard.    No friction rub. No gallop.  Pulmonary:     Effort: Pulmonary effort is normal. No respiratory distress.     Breath sounds: Normal breath sounds. No wheezing or rales.  Chest:     Chest wall: No tenderness.  Abdominal:     General: Bowel sounds are normal. There is no  Subjective:  Patient ID: Bruce Ellison, male    DOB: 1980/11/08  Age: 42 y.o. MRN: [id number]  CC: Follow-up   HPI Bruce Ellison presents for repeat painful rectal fissure. He saw Dr Rhea Belton for it. H/o colon polyps. Pt lost wt on diet  Outpatient Medications Prior to Visit  Medication Sig Dispense Refill   ARIPiprazole (ABILIFY) 5 MG tablet Take 1 tablet (5 mg total) by mouth daily. 90 tablet 1   betamethasone dipropionate 0.05 % cream Apply topically 2 (two) times daily as needed. 30 g 1   Cholecalciferol (VITAMIN D3) 50 MCG (2000 UT) capsule Take 1 capsule (2,000 Units total) by mouth daily. 100 capsule 3   diltiazem 2 % GEL Apply 1 Application topically 2 (two) times daily. 30 g 0   mirtazapine (REMERON) 15 MG tablet Take 1 tablet (15 mg total) by mouth at bedtime. 90 tablet 1   Multiple Vitamin (MULTIVITAMIN WITH MINERALS) TABS Take 1 tablet by mouth every morning.     venlafaxine XR (EFFEXOR-XR) 75 MG 24 hr capsule Take 1 capsule (75 mg total) by mouth daily with breakfast. 90 capsule 1   No facility-administered medications prior to visit.    ROS: Review of Systems  Constitutional:  Positive for unexpected weight change. Negative for appetite change and fatigue.  HENT:  Negative for congestion, nosebleeds, sneezing, sore throat and trouble swallowing.   Eyes:  Negative for itching and visual disturbance.  Respiratory:  Negative for cough.   Cardiovascular:  Negative for chest pain, palpitations and leg swelling.  Gastrointestinal:  Positive for anal bleeding and rectal pain. Negative for abdominal distention, blood in stool, diarrhea and nausea.  Genitourinary:  Negative for frequency and hematuria.  Musculoskeletal:  Negative for back pain, gait problem, joint swelling and neck pain.  Skin:  Negative for rash.  Neurological:  Negative for dizziness, tremors, speech difficulty and weakness.  Psychiatric/Behavioral:  Negative for agitation, dysphoric mood and sleep  disturbance. The patient is not nervous/anxious.     Objective:  BP 118/80 (BP Location: Right Arm, Patient Position: Sitting, Cuff Size: Normal)   Pulse (!) 106   Temp 98.3 F (36.8 C) (Oral)   Ht 5\' 6"  (1.676 m)   Wt 221 lb (100.2 kg)   SpO2 98%   BMI 35.67 kg/m   BP Readings from Last 3 Encounters:  11/29/22 118/80  04/18/22 124/62  11/22/21 122/80    Wt Readings from Last 3 Encounters:  11/29/22 221 lb (100.2 kg)  04/18/22 232 lb (105.2 kg)  11/22/21 235 lb 12.8 oz (107 kg)    Physical Exam Constitutional:      General: He is not in acute distress.    Appearance: He is well-developed. He is obese.     Comments: NAD  Eyes:     Conjunctiva/sclera: Conjunctivae normal.     Pupils: Pupils are equal, round, and reactive to light.  Neck:     Thyroid: No thyromegaly.     Vascular: No JVD.  Cardiovascular:     Rate and Rhythm: Normal rate and regular rhythm.     Heart sounds: Normal heart sounds. No murmur heard.    No friction rub. No gallop.  Pulmonary:     Effort: Pulmonary effort is normal. No respiratory distress.     Breath sounds: Normal breath sounds. No wheezing or rales.  Chest:     Chest wall: No tenderness.  Abdominal:     General: Bowel sounds are normal. There is no  Subjective:  Patient ID: Bruce Ellison, male    DOB: 1980/11/08  Age: 42 y.o. MRN: [id number]  CC: Follow-up   HPI Bruce Ellison presents for repeat painful rectal fissure. He saw Dr Rhea Belton for it. H/o colon polyps. Pt lost wt on diet  Outpatient Medications Prior to Visit  Medication Sig Dispense Refill   ARIPiprazole (ABILIFY) 5 MG tablet Take 1 tablet (5 mg total) by mouth daily. 90 tablet 1   betamethasone dipropionate 0.05 % cream Apply topically 2 (two) times daily as needed. 30 g 1   Cholecalciferol (VITAMIN D3) 50 MCG (2000 UT) capsule Take 1 capsule (2,000 Units total) by mouth daily. 100 capsule 3   diltiazem 2 % GEL Apply 1 Application topically 2 (two) times daily. 30 g 0   mirtazapine (REMERON) 15 MG tablet Take 1 tablet (15 mg total) by mouth at bedtime. 90 tablet 1   Multiple Vitamin (MULTIVITAMIN WITH MINERALS) TABS Take 1 tablet by mouth every morning.     venlafaxine XR (EFFEXOR-XR) 75 MG 24 hr capsule Take 1 capsule (75 mg total) by mouth daily with breakfast. 90 capsule 1   No facility-administered medications prior to visit.    ROS: Review of Systems  Constitutional:  Positive for unexpected weight change. Negative for appetite change and fatigue.  HENT:  Negative for congestion, nosebleeds, sneezing, sore throat and trouble swallowing.   Eyes:  Negative for itching and visual disturbance.  Respiratory:  Negative for cough.   Cardiovascular:  Negative for chest pain, palpitations and leg swelling.  Gastrointestinal:  Positive for anal bleeding and rectal pain. Negative for abdominal distention, blood in stool, diarrhea and nausea.  Genitourinary:  Negative for frequency and hematuria.  Musculoskeletal:  Negative for back pain, gait problem, joint swelling and neck pain.  Skin:  Negative for rash.  Neurological:  Negative for dizziness, tremors, speech difficulty and weakness.  Psychiatric/Behavioral:  Negative for agitation, dysphoric mood and sleep  disturbance. The patient is not nervous/anxious.     Objective:  BP 118/80 (BP Location: Right Arm, Patient Position: Sitting, Cuff Size: Normal)   Pulse (!) 106   Temp 98.3 F (36.8 C) (Oral)   Ht 5\' 6"  (1.676 m)   Wt 221 lb (100.2 kg)   SpO2 98%   BMI 35.67 kg/m   BP Readings from Last 3 Encounters:  11/29/22 118/80  04/18/22 124/62  11/22/21 122/80    Wt Readings from Last 3 Encounters:  11/29/22 221 lb (100.2 kg)  04/18/22 232 lb (105.2 kg)  11/22/21 235 lb 12.8 oz (107 kg)    Physical Exam Constitutional:      General: He is not in acute distress.    Appearance: He is well-developed. He is obese.     Comments: NAD  Eyes:     Conjunctiva/sclera: Conjunctivae normal.     Pupils: Pupils are equal, round, and reactive to light.  Neck:     Thyroid: No thyromegaly.     Vascular: No JVD.  Cardiovascular:     Rate and Rhythm: Normal rate and regular rhythm.     Heart sounds: Normal heart sounds. No murmur heard.    No friction rub. No gallop.  Pulmonary:     Effort: Pulmonary effort is normal. No respiratory distress.     Breath sounds: Normal breath sounds. No wheezing or rales.  Chest:     Chest wall: No tenderness.  Abdominal:     General: Bowel sounds are normal. There is no  Subjective:  Patient ID: Bruce Ellison, male    DOB: 1980/11/08  Age: 42 y.o. MRN: [id number]  CC: Follow-up   HPI Bruce Ellison presents for repeat painful rectal fissure. He saw Dr Rhea Belton for it. H/o colon polyps. Pt lost wt on diet  Outpatient Medications Prior to Visit  Medication Sig Dispense Refill   ARIPiprazole (ABILIFY) 5 MG tablet Take 1 tablet (5 mg total) by mouth daily. 90 tablet 1   betamethasone dipropionate 0.05 % cream Apply topically 2 (two) times daily as needed. 30 g 1   Cholecalciferol (VITAMIN D3) 50 MCG (2000 UT) capsule Take 1 capsule (2,000 Units total) by mouth daily. 100 capsule 3   diltiazem 2 % GEL Apply 1 Application topically 2 (two) times daily. 30 g 0   mirtazapine (REMERON) 15 MG tablet Take 1 tablet (15 mg total) by mouth at bedtime. 90 tablet 1   Multiple Vitamin (MULTIVITAMIN WITH MINERALS) TABS Take 1 tablet by mouth every morning.     venlafaxine XR (EFFEXOR-XR) 75 MG 24 hr capsule Take 1 capsule (75 mg total) by mouth daily with breakfast. 90 capsule 1   No facility-administered medications prior to visit.    ROS: Review of Systems  Constitutional:  Positive for unexpected weight change. Negative for appetite change and fatigue.  HENT:  Negative for congestion, nosebleeds, sneezing, sore throat and trouble swallowing.   Eyes:  Negative for itching and visual disturbance.  Respiratory:  Negative for cough.   Cardiovascular:  Negative for chest pain, palpitations and leg swelling.  Gastrointestinal:  Positive for anal bleeding and rectal pain. Negative for abdominal distention, blood in stool, diarrhea and nausea.  Genitourinary:  Negative for frequency and hematuria.  Musculoskeletal:  Negative for back pain, gait problem, joint swelling and neck pain.  Skin:  Negative for rash.  Neurological:  Negative for dizziness, tremors, speech difficulty and weakness.  Psychiatric/Behavioral:  Negative for agitation, dysphoric mood and sleep  disturbance. The patient is not nervous/anxious.     Objective:  BP 118/80 (BP Location: Right Arm, Patient Position: Sitting, Cuff Size: Normal)   Pulse (!) 106   Temp 98.3 F (36.8 C) (Oral)   Ht 5\' 6"  (1.676 m)   Wt 221 lb (100.2 kg)   SpO2 98%   BMI 35.67 kg/m   BP Readings from Last 3 Encounters:  11/29/22 118/80  04/18/22 124/62  11/22/21 122/80    Wt Readings from Last 3 Encounters:  11/29/22 221 lb (100.2 kg)  04/18/22 232 lb (105.2 kg)  11/22/21 235 lb 12.8 oz (107 kg)    Physical Exam Constitutional:      General: He is not in acute distress.    Appearance: He is well-developed. He is obese.     Comments: NAD  Eyes:     Conjunctiva/sclera: Conjunctivae normal.     Pupils: Pupils are equal, round, and reactive to light.  Neck:     Thyroid: No thyromegaly.     Vascular: No JVD.  Cardiovascular:     Rate and Rhythm: Normal rate and regular rhythm.     Heart sounds: Normal heart sounds. No murmur heard.    No friction rub. No gallop.  Pulmonary:     Effort: Pulmonary effort is normal. No respiratory distress.     Breath sounds: Normal breath sounds. No wheezing or rales.  Chest:     Chest wall: No tenderness.  Abdominal:     General: Bowel sounds are normal. There is no  distension.     Palpations: Abdomen is soft. There is no mass.     Tenderness: There is no abdominal tenderness. There is no right CVA tenderness, guarding or rebound.  Musculoskeletal:        General: No tenderness. Normal range of motion.     Cervical back: Normal range of motion.  Lymphadenopathy:     Cervical: No cervical adenopathy.  Skin:    General: Skin is warm and dry.     Findings: Rash present.  Neurological:     Mental Status: He is alert and oriented to person, place, and time.     Cranial Nerves: No cranial nerve deficit.     Motor: No abnormal muscle tone.     Coordination: Coordination normal.     Gait: Gait normal.     Deep Tendon Reflexes: Reflexes are normal and  symmetric.  Psychiatric:        Behavior: Behavior normal.        Thought Content: Thought content normal.        Judgment: Judgment normal.   Anus w/erythema No ext hemorrhoids SD rash on face  Lab Results  Component Value Date   WBC 5.2 10/11/2021   HGB 17.0 10/11/2021   HCT 48.0 10/11/2021   PLT 223 10/11/2021   GLUCOSE 119 (H) 11/22/2021   CHOL 195 10/12/2021   TRIG 101 10/12/2021   HDL 33 (L) 10/12/2021   LDLDIRECT 136.9 08/30/2010   LDLCALC 142 (H) 10/12/2021   ALT 43 11/22/2021   AST 28 11/22/2021   NA 137 11/22/2021   K 3.8 11/22/2021   CL 103 11/22/2021   CREATININE 1.04 11/22/2021   BUN 15 11/22/2021   CO2 27 11/22/2021   TSH 0.97 11/08/2021   PSA 0.65 03/10/2015   HGBA1C 5.7 11/22/2021    ECHOCARDIOGRAM COMPLETE  Result Date: 10/12/2021    ECHOCARDIOGRAM REPORT   Patient Name:   DEJUAN BARCOMB Date of Exam: 10/12/2021 Medical Rec #:  [id number]    Height:       66.0 in Accession #:    [id number]   Weight:       231.5 lb Date of Birth:  [birth year]    BSA:          2.128 m Patient Age:    41 years     BP:           120/70 mmHg Patient Gender: M            HR:           82 bpm. Exam Location:  Inpatient Procedure: 2D Echo, Cardiac Doppler and Color Doppler Indications:    R07.9 Chest Pain  History:        Patient has no prior history of Echocardiogram examinations.                 Risk Factors:Hypertension and Dyslipidemia. Sleep apnea,                 Tourette's & OCD (obsessive compulsive disorder) (From Hx).  Sonographer:    Celesta Gentile RCS Referring Phys: [id number] Teddy Spike IMPRESSIONS  1. Left ventricular ejection fraction, by estimation, is 60 to 65%. The left ventricle has normal function. The left ventricle has no regional wall motion abnormalities. Left ventricular diastolic parameters were normal.  2. Right ventricular systolic function is normal. The right ventricular size is normal.  3. The mitral valve is normal in structure. Trivial mitral valve  regurgitation.

## 2022-11-29 NOTE — Assessment & Plan Note (Signed)
Check A1c 

## 2022-11-29 NOTE — Assessment & Plan Note (Signed)
Cont on Vit D

## 2022-12-11 ENCOUNTER — Telehealth: Payer: Self-pay | Admitting: Internal Medicine

## 2022-12-11 NOTE — Telephone Encounter (Signed)
Patient called stated he is having issues with anal fissure. Seeking advise.

## 2022-12-12 NOTE — Telephone Encounter (Signed)
PT returning call to speak with nurse. Please advise.

## 2022-12-12 NOTE — Telephone Encounter (Signed)
Patient states he was seen in Feb by Dr. Rhea Belton and found to have an anal fissure. He was given diltiazem gel then and he had some left that he has been using but it does not seem to be helping. Pt scheduled to see Amy Esterwood PA 12/20/22 at 3:30pm. Pt aware of appt.

## 2022-12-20 ENCOUNTER — Ambulatory Visit: Payer: Medicare Other | Admitting: Physician Assistant

## 2023-03-13 ENCOUNTER — Encounter: Payer: Self-pay | Admitting: Internal Medicine

## 2023-03-18 ENCOUNTER — Other Ambulatory Visit: Payer: Self-pay | Admitting: Internal Medicine

## 2023-03-18 MED ORDER — DESONIDE 0.05 % EX CREA: TOPICAL_CREAM | Freq: Two times a day (BID) | CUTANEOUS | 1 refills | Status: AC

## 2023-04-09 ENCOUNTER — Telehealth: Payer: Self-pay | Admitting: Pharmacy Technician

## 2023-04-09 ENCOUNTER — Other Ambulatory Visit (HOSPITAL_COMMUNITY): Payer: Self-pay

## 2023-04-09 NOTE — Telephone Encounter (Signed)
Pharmacy Patient Advocate Encounter   Received notification from CoverMyMeds that prior authorization for Desonide 0.05% cream is required/requested.   Insurance verification completed.   The patient is insured through [facility] .   Per test claim: PA required; PA submitted to above mentioned insurance via CoverMyMeds Key/confirmation #/EOC [id number] Status is pending

## 2023-04-11 NOTE — Telephone Encounter (Signed)
 Pharmacy Patient Advocate Encounter  Received notification from [employer] that Prior Authorization for Desonide  0.05% cream  has been DENIED.  Full denial letter will be uploaded to the media tab. See denial reason below.     PA #/Case ID/Reference #: [id number]

## 2023-04-15 NOTE — Telephone Encounter (Signed)
 Noted. Thanks.

## 2023-04-30 ENCOUNTER — Encounter (HOSPITAL_COMMUNITY): Payer: Self-pay | Admitting: Psychiatry

## 2023-04-30 ENCOUNTER — Telehealth (HOSPITAL_BASED_OUTPATIENT_CLINIC_OR_DEPARTMENT_OTHER): Payer: Medicare Other | Admitting: Psychiatry

## 2023-04-30 VITALS — Wt 205.0 lb

## 2023-04-30 DIAGNOSIS — F429 Obsessive-compulsive disorder, unspecified: Secondary | ICD-10-CM

## 2023-04-30 DIAGNOSIS — F411 Generalized anxiety disorder: Secondary | ICD-10-CM

## 2023-04-30 MED ORDER — ARIPIPRAZOLE 5 MG PO TABS
5.0000 mg | ORAL_TABLET | Freq: Every day | ORAL | 1 refills | Status: DC
Start: 2023-04-30 — End: 2023-10-29

## 2023-04-30 MED ORDER — MIRTAZAPINE 15 MG PO TABS
15.0000 mg | ORAL_TABLET | Freq: Every day | ORAL | 1 refills | Status: DC
Start: 2023-04-30 — End: 2023-10-29

## 2023-04-30 MED ORDER — VENLAFAXINE HCL ER 75 MG PO CP24
75.0000 mg | ORAL_CAPSULE | Freq: Every day | ORAL | 1 refills | Status: DC
Start: 2023-04-30 — End: 2023-10-29

## 2023-04-30 NOTE — Progress Notes (Signed)
 [facility] MD Virtual Progress Note   Patient Location: Home Provider Location: Home Office  I connect with patient by video and verified that I am speaking with correct person by using two identifiers. I discussed the limitations of evaluation and management by telemedicine and the availability of in person appointments. I also discussed with the patient that there may be a patient responsible charge related to this service. The patient expressed understanding and agreed to proceed.  Bruce Ellison [id number] 43 y.o.  04/30/2023 10:21 AM  History of Present Illness:  Patient is evaluated virtually.  His camera did not work and he apologized for that.  Overall he feels symptoms are clear but stable.  He continues to have obsessive thought about counting, tapping and repetitive behavior but they are chronic and is stable.  He does not want to change his medication.  He is trying to lose weight and he had loss more than 15 pounds since last visit.  He does go outside for a walk.  He is trying to lose weight because constipation causes anal fissure to get worst.  He is taking MiraLAX and walking which is helping.  He also watching his calorie intake.  Denies any major panic attack or any crying spells.  Sometimes he had sweaty hands which he believe could be his anxiety or medication side effects.  He is taking low-dose Effexor, mirtazapine and Abilify.  He is anxious and nervous around public places.  He is not interested to change his medication or consider therapy.  He saw his primary care and labs are drawn which are stable.  He has no tremors or shakes.  Past Psychiatric History: H/O anxiety, Tic and depression since age 71.  Received treatment in [place].  Tried Geodon, Risperdal, Zoloft, Valium, Klonopin, Xanax, Prozac, Ambien, and Seroquel.  Saw psychiatrist at [facility] and then in [place] and [place].  Tried Luvox but made him more irritable.  No history of  suicidal attempt.    Outpatient Encounter Medications as of 04/30/2023  Medication Sig   ARIPiprazole (ABILIFY) 5 MG tablet Take 1 tablet (5 mg total) by mouth daily.   betamethasone dipropionate 0.05 % cream Apply topically 2 (two) times daily as needed.   Cholecalciferol (VITAMIN D3) 50 MCG (2000 UT) capsule Take 1 capsule (2,000 Units total) by mouth daily.   desonide (DESOWEN) 0.05 % cream Apply topically 2 (two) times daily.   diltiazem 2 % GEL Apply 1 Application topically 2 (two) times daily.   HYDROCORTISONE ACE, RECTAL, (PROCTOCORT) 30 MG SUPP 1 pr bid   mirtazapine (REMERON) 15 MG tablet Take 1 tablet (15 mg total) by mouth at bedtime.   Multiple Vitamin (MULTIVITAMIN WITH MINERALS) TABS Take 1 tablet by mouth every morning.   triamcinolone ointment (KENALOG) 0.5 % Apply 1 Application topically 3 (three) times daily.   venlafaxine XR (EFFEXOR-XR) 75 MG 24 hr capsule Take 1 capsule (75 mg total) by mouth daily with breakfast.   No facility-administered encounter medications on file as of 04/30/2023.    No results found for this or any previous visit (from the past 2160 hours).   Psychiatric Specialty Exam: Physical Exam  Review of Systems  Constitutional:        Hand sweats   Neurological:  Positive for headaches.    Weight 205 lb (93 kg).There is no height or weight on file to calculate BMI.  General Appearance: NA  Eye Contact:  NA  Speech:  Slow  Volume:  Decreased  Mood:  Anxious  Affect:  NA  Thought Process:  Goal Directed  Orientation:  Full (Time, Place, and Person)  Thought Content:  Logical and Obsessions  Suicidal Thoughts:  No  Homicidal Thoughts:  No  Memory:  Immediate;   Good Recent;   Good Remote;   Good  Judgement:  Intact  Insight:  Present  Psychomotor Activity:  NA  Concentration:  Concentration: Fair and Attention Span: Fair  Recall:  Good  Fund of Knowledge:  Good  Language:  Good  Akathisia:  No  Handed:  Right  AIMS (if indicated):      Assets:  Communication Skills Desire for Improvement Housing Resilience Transportation  ADL's:  Intact  Cognition:  WNL  Sleep:  ok     Assessment/Plan: Obsessive-compulsive disorder, unspecified type - Plan: ARIPiprazole (ABILIFY) 5 MG tablet, mirtazapine (REMERON) 15 MG tablet  GAD (generalized anxiety disorder) - Plan: venlafaxine XR (EFFEXOR-XR) 75 MG 24 hr capsule, mirtazapine (REMERON) 15 MG tablet  I review labs which is stable.  Patient has present all symptoms of anxiety but stable and he does not want to change or increase the medication.  Continue Abilify 5 mg daily, Effexor 75 mg daily and mirtazapine 15 mg at bedtime.  Patient like to have a follow-up in 6 months.  I encouraged to call us back if is any question, concern or if you feel worsening of the symptoms.   Follow Up Instructions:     I discussed the assessment and treatment plan with the patient. The patient was provided an opportunity to ask questions and all were answered. The patient agreed with the plan and demonstrated an understanding of the instructions.   The patient was advised to call back or seek an in-person evaluation if the symptoms worsen or if the condition fails to improve as anticipated.    Collaboration of Care: Other provider involved in patient's care AEB notes are available in epic to review  Patient/Guardian was advised Release of Information must be obtained prior to any record release in order to collaborate their care with an outside provider. Patient/Guardian was advised if they have not already done so to contact the registration department to sign all necessary forms in order for [place] to release information regarding their care.   Consent: Patient/Guardian gives verbal consent for treatment and assignment of benefits for services provided during this visit. Patient/Guardian expressed understanding and agreed to proceed.     I provided 16 minutes of non face to face time during this  encounter.  Note: This document was prepared by [employer] voice dictation technology and any errors that results from this process are unintentional.    Cleotis Nipper, MD 04/30/2023

## 2023-05-02 ENCOUNTER — Telehealth (HOSPITAL_COMMUNITY): Payer: Medicare Other | Admitting: Psychiatry

## 2023-05-13 ENCOUNTER — Encounter: Payer: Self-pay | Admitting: Internal Medicine

## 2023-05-14 ENCOUNTER — Other Ambulatory Visit (INDEPENDENT_AMBULATORY_CARE_PROVIDER_SITE_OTHER)

## 2023-05-14 ENCOUNTER — Ambulatory Visit: Payer: Self-pay | Admitting: Internal Medicine

## 2023-05-14 ENCOUNTER — Encounter: Payer: Self-pay | Admitting: Internal Medicine

## 2023-05-14 ENCOUNTER — Ambulatory Visit (INDEPENDENT_AMBULATORY_CARE_PROVIDER_SITE_OTHER): Admitting: Internal Medicine

## 2023-05-14 VITALS — BP 102/78 | HR 116 | Temp 98.8°F | Ht 66.0 in | Wt 199.4 lb

## 2023-05-14 DIAGNOSIS — L219 Seborrheic dermatitis, unspecified: Secondary | ICD-10-CM | POA: Diagnosis not present

## 2023-05-14 DIAGNOSIS — R634 Abnormal weight loss: Secondary | ICD-10-CM

## 2023-05-14 DIAGNOSIS — J029 Acute pharyngitis, unspecified: Secondary | ICD-10-CM | POA: Diagnosis not present

## 2023-05-14 DIAGNOSIS — R509 Fever, unspecified: Secondary | ICD-10-CM

## 2023-05-14 LAB — POCT INFLUENZA A/B
Influenza A, POC: NEGATIVE
Influenza B, POC: NEGATIVE

## 2023-05-14 LAB — POCT RAPID STREP A (OFFICE): Rapid Strep A Screen: NEGATIVE

## 2023-05-14 LAB — POC COVID19 BINAXNOW: SARS Coronavirus 2 Ag: NEGATIVE

## 2023-05-14 NOTE — Telephone Encounter (Signed)
 Copied from CRM [id number]. Topic: Clinical - Red Word Triage >> May 14, 2023  8:37 AM Sim Boast F wrote: Red Word that prompted transfer to Nurse Triage: Patient says he had fever for the past 9 days, today 100.4 and body aches Reason for Disposition  Fever present > 3 days (72 hours)  Answer Assessment - Initial Assessment Questions 1. TEMPERATURE: "What is the most recent temperature?"  "How was it measured?"      99.1-100.4 2. ONSET: "When did the fever start?"      About 9 days ago 3. CHILLS: "Do you have chills?" If yes: "How bad are they?"  (e.g., none, mild, moderate, severe)   - NONE: no chills   - MILD: feeling cold   - MODERATE: feeling very cold, some shivering (feels better under a thick blanket)   - SEVERE: feeling extremely cold with shaking chills (general body shaking, rigors; even under a thick blanket)      A little 4. OTHER SYMPTOMS: "Do you have any other symptoms besides the fever?"  (e.g., abdomen pain, cough, diarrhea, earache, headache, sore throat, urination pain)     Doesn't feel well - some body aches 5. CAUSE: If there are no symptoms, ask: "What do you think is causing the fever?"      unsure 6. CONTACTS: "Does anyone else in the family have an infection?"     no 7. TREATMENT: "What have you done so far to treat this fever?" (e.g., medications)     Tylenol - IBU 8. IMMUNOCOMPROMISE: "Do you have of the following: diabetes, HIV positive, splenectomy, cancer chemotherapy, chronic steroid treatment, transplant patient, etc."     no  Protocols used: Fever-A-AH

## 2023-05-14 NOTE — Assessment & Plan Note (Signed)
  On diet  

## 2023-05-14 NOTE — Assessment & Plan Note (Addendum)
 Viral syndrome vs other Strep (-) Flu A, B (-) COVID (-) CBC, UA, CMET, mono CXR Pt declined empiric abx Tylenol prn

## 2023-05-14 NOTE — Assessment & Plan Note (Signed)
 Strep (-) Flu A, B (-) COVID (-)

## 2023-05-14 NOTE — Progress Notes (Signed)
 Subjective:  Patient ID: Bruce Ellison, male    DOB: March 22, 1980  Age: 43 y.o. MRN: [id number]  CC: Fever (Fever for the past 9 days. Up until this morning fever had been low grade around 99.4. This morning reading at 100.4. Slight intermittent body aches starting yesterday.  Currently treating with advil. )   HPI Bruce Ellison presents for URI, fever x 9 days C/o HA, achy Fever for the past 9 days. Up until this morning fever had been low grade around 99.4. This morning reading at 100.4. Slight intermittent body aches starting yesterday.  Currently treating with advil.    Outpatient Medications Prior to Visit  Medication Sig Dispense Refill   ARIPiprazole (ABILIFY) 5 MG tablet Take 1 tablet (5 mg total) by mouth daily. 90 tablet 1   betamethasone dipropionate 0.05 % cream Apply topically 2 (two) times daily as needed. 30 g 1   Cholecalciferol (VITAMIN D3) 50 MCG (2000 UT) capsule Take 1 capsule (2,000 Units total) by mouth daily. 100 capsule 3   desonide (DESOWEN) 0.05 % cream Apply topically 2 (two) times daily. 15 g 1   diltiazem 2 % GEL Apply 1 Application topically 2 (two) times daily. 30 g 0   HYDROCORTISONE ACE, RECTAL, (PROCTOCORT) 30 MG SUPP 1 pr bid 20 suppository 1   mirtazapine (REMERON) 15 MG tablet Take 1 tablet (15 mg total) by mouth at bedtime. 90 tablet 1   Multiple Vitamin (MULTIVITAMIN WITH MINERALS) TABS Take 1 tablet by mouth every morning.     triamcinolone ointment (KENALOG) 0.5 % Apply 1 Application topically 3 (three) times daily. 90 g 1   venlafaxine XR (EFFEXOR-XR) 75 MG 24 hr capsule Take 1 capsule (75 mg total) by mouth daily with breakfast. 90 capsule 1   No facility-administered medications prior to visit.    ROS: Review of Systems  Constitutional:  Positive for fatigue. Negative for appetite change and unexpected weight change.  HENT:  Positive for congestion. Negative for nosebleeds, sneezing, sore throat and trouble swallowing.   Eyes:  Negative for  itching and visual disturbance.  Respiratory:  Negative for cough and shortness of breath.   Cardiovascular:  Negative for chest pain, palpitations and leg swelling.  Gastrointestinal:  Negative for abdominal distention, blood in stool, diarrhea and nausea.  Genitourinary:  Negative for decreased urine volume, dysuria, frequency, hematuria and urgency.  Musculoskeletal:  Positive for arthralgias. Negative for back pain, gait problem, joint swelling and neck pain.  Skin:  Positive for rash.  Neurological:  Negative for dizziness, tremors, speech difficulty and weakness.  Hematological:  Does not bruise/bleed easily.  Psychiatric/Behavioral:  Negative for agitation, dysphoric mood, sleep disturbance and suicidal ideas. The patient is not nervous/anxious.     Objective:  BP 102/78   Pulse (!) 116   Temp 98.8 F (37.1 C)   Ht 5\' 6"  (1.676 m)   Wt 199 lb 6.4 oz (90.4 kg)   SpO2 98%   BMI 32.18 kg/m   BP Readings from Last 3 Encounters:  05/14/23 102/78  11/29/22 118/80  04/18/22 124/62    Wt Readings from Last 3 Encounters:  05/14/23 199 lb 6.4 oz (90.4 kg)  11/29/22 221 lb (100.2 kg)  04/18/22 232 lb (105.2 kg)    Physical Exam Constitutional:      General: He is not in acute distress.    Appearance: Normal appearance. He is well-developed.     Comments: NAD  Eyes:     Conjunctiva/sclera: Conjunctivae normal.  Pupils: Pupils are equal, round, and reactive to light.  Neck:     Thyroid: No thyromegaly.     Vascular: No JVD.  Cardiovascular:     Rate and Rhythm: Normal rate and regular rhythm.     Heart sounds: Normal heart sounds. No murmur heard.    No friction rub. No gallop.  Pulmonary:     Effort: Pulmonary effort is normal. No respiratory distress.     Breath sounds: Normal breath sounds. No wheezing or rales.  Chest:     Chest wall: No tenderness.  Abdominal:     General: Bowel sounds are normal. There is no distension.     Palpations: Abdomen is soft.  There is no mass.     Tenderness: There is no abdominal tenderness. There is no guarding or rebound.  Musculoskeletal:        General: No tenderness. Normal range of motion.     Cervical back: Normal range of motion.  Lymphadenopathy:     Cervical: No cervical adenopathy.  Skin:    General: Skin is warm and dry.     Findings: No rash.  Neurological:     Mental Status: He is alert and oriented to person, place, and time.     Cranial Nerves: No cranial nerve deficit.     Motor: No abnormal muscle tone.     Coordination: Coordination normal.     Gait: Gait normal.     Deep Tendon Reflexes: Reflexes are normal and symmetric.  Psychiatric:        Behavior: Behavior normal.        Thought Content: Thought content normal.        Judgment: Judgment normal.   Eryth throat SD on face  Strep (-) Flu A, B (-) COVID (-)   Lab Results  Component Value Date   WBC 3.9 (L) 11/29/2022   HGB 16.9 11/29/2022   HCT 50.1 11/29/2022   PLT 261.0 11/29/2022   GLUCOSE 115 (H) 11/29/2022   CHOL 162 11/29/2022   TRIG 104.0 11/29/2022   HDL 38.40 (L) 11/29/2022   LDLDIRECT 136.9 08/30/2010   LDLCALC 103 (H) 11/29/2022   ALT 51 11/29/2022   AST 31 11/29/2022   NA 140 11/29/2022   K 4.3 11/29/2022   CL 105 11/29/2022   CREATININE 1.01 11/29/2022   BUN 9 11/29/2022   CO2 27 11/29/2022   TSH 0.97 11/29/2022   PSA 0.65 03/10/2015   HGBA1C 5.4 11/29/2022    ECHOCARDIOGRAM COMPLETE Result Date: 10/12/2021    ECHOCARDIOGRAM REPORT   Patient Name:   Bruce Ellison Date of Exam: 10/12/2021 Medical Rec #:  [id number]    Height:       66.0 in Accession #:    [id number]   Weight:       231.5 lb Date of Birth:  09-07-1980    BSA:          2.128 m Patient Age:    41 years     BP:           120/70 mmHg Patient Gender: M            HR:           82 bpm. Exam Location:  Inpatient Procedure: 2D Echo, Cardiac Doppler and Color Doppler Indications:    R07.9 Chest Pain  History:        Patient has no prior history of  Echocardiogram examinations.  Risk Factors:Hypertension and Dyslipidemia. Sleep apnea,                 Tourette's & OCD (obsessive compulsive disorder) (From Hx).  Sonographer:    Celesta Gentile RCS Referring Phys: [id number] Teddy Spike IMPRESSIONS  1. Left ventricular ejection fraction, by estimation, is 60 to 65%. The left ventricle has normal function. The left ventricle has no regional wall motion abnormalities. Left ventricular diastolic parameters were normal.  2. Right ventricular systolic function is normal. The right ventricular size is normal.  3. The mitral valve is normal in structure. Trivial mitral valve regurgitation. No evidence of mitral stenosis.  4. The aortic valve is normal in structure. Aortic valve regurgitation is not visualized. No aortic stenosis is present.  5. The inferior vena cava is normal in size with greater than 50% respiratory variability, suggesting right atrial pressure of 3 mmHg. FINDINGS  Left Ventricle: Left ventricular ejection fraction, by estimation, is 60 to 65%. The left ventricle has normal function. The left ventricle has no regional wall motion abnormalities. The left ventricular internal cavity size was normal in size. There is  no left ventricular hypertrophy. Left ventricular diastolic parameters were normal. Right Ventricle: The right ventricular size is normal. No increase in right ventricular wall thickness. Right ventricular systolic function is normal. Left Atrium: Left atrial size was normal in size. Right Atrium: Right atrial size was normal in size. Pericardium: There is no evidence of pericardial effusion. Mitral Valve: The mitral valve is normal in structure. Trivial mitral valve regurgitation. No evidence of mitral valve stenosis. Tricuspid Valve: The tricuspid valve is normal in structure. Tricuspid valve regurgitation is trivial. No evidence of tricuspid stenosis. Aortic Valve: The aortic valve is normal in structure. Aortic valve  regurgitation is not visualized. No aortic stenosis is present. Pulmonic Valve: The pulmonic valve was not well visualized. Pulmonic valve regurgitation is trivial. No evidence of pulmonic stenosis. Aorta: The aortic root is normal in size and structure. Venous: The inferior vena cava is normal in size with greater than 50% respiratory variability, suggesting right atrial pressure of 3 mmHg. IAS/Shunts: No atrial level shunt detected by color flow Doppler.  LEFT VENTRICLE PLAX 2D LVIDd:         4.50 cm   Diastology LVIDs:         3.00 cm   LV e' medial:    9.48 cm/s LV PW:         0.90 cm   LV E/e' medial:  9.3 LV IVS:        0.90 cm   LV e' lateral:   17.30 cm/s LVOT diam:     2.20 cm   LV E/e' lateral: 5.1 LV SV:         75 LV SV Index:   35 LVOT Area:     3.80 cm  RIGHT VENTRICLE RV S prime:     14.90 cm/s TAPSE (M-mode): 2.7 cm LEFT ATRIUM             Index        RIGHT ATRIUM           Index LA diam:        4.00 cm 1.88 cm/m   RA Area:     15.40 cm LA Vol (A2C):   56.5 ml 26.55 ml/m  RA Volume:   34.60 ml  16.26 ml/m LA Vol (A4C):   46.1 ml 21.66 ml/m LA Biplane Vol: 51.6 ml 24.25 ml/m  AORTIC VALVE LVOT Vmax:   117.00 cm/s LVOT Vmean:  72.600 cm/s LVOT VTI:    0.198 m  AORTA Ao Root diam: 3.40 cm MITRAL VALVE MV Area (PHT): 3.77 cm    SHUNTS MV Decel Time: 201 msec    Systemic VTI:  0.20 m MV E velocity: 87.70 cm/s  Systemic Diam: 2.20 cm MV A velocity: 58.60 cm/s MV E/A ratio:  1.50 Kardie Tobb DO Electronically signed by Thomasene Ripple DO Signature Date/Time: 10/12/2021/2:22:37 PM    Final    CT Angio Chest PE W and/or Wo Contrast Result Date: 10/11/2021 CLINICAL DATA:  43 year old male with left-sided chest pain radiating to the back. EXAM: CT ANGIOGRAPHY CHEST WITH CONTRAST TECHNIQUE: Multidetector CT imaging of the chest was performed using the standard protocol during bolus administration of intravenous contrast. Multiplanar CT image reconstructions and MIPs were obtained to evaluate the vascular  anatomy. RADIATION DOSE REDUCTION: This exam was performed according to the departmental dose-optimization program which includes automated exposure control, adjustment of the mA and/or kV according to patient size and/or use of iterative reconstruction technique. CONTRAST:  Seventy-five mL Omnipaque 350, intravenous COMPARISON:  None Available. FINDINGS: Cardiovascular: Satisfactory opacification of the pulmonary arteries to the segmental level. No evidence of pulmonary embolism. Normal heart size. No pericardial effusion. Evidence of acute aortic abnormality. Mediastinum/Nodes: No enlarged mediastinal, hilar, or axillary lymph nodes. Thyroid gland, trachea, and esophagus demonstrate no significant findings. Lungs/Pleura: No focal consolidations. No suspicious pulmonary nodules. No pleural effusion or pneumothorax. Upper Abdomen: The visualized upper abdomen is within normal limits. Musculoskeletal: No chest wall abnormality. No acute or significant osseous findings. Review of the MIP images confirms the above findings. IMPRESSION: Vascular: No evidence of pulmonary embolism or acute aortic syndrome. Non-Vascular: No acute intrathoracic abnormality. Marliss Coots, MD [facility] Electronically Signed   By: Marliss Coots M.D.   On: 10/11/2021 16:30   DG Chest 2 View Result Date: 10/11/2021 CLINICAL DATA:  Chest pain and shortness of breath EXAM: CHEST - 2 VIEW COMPARISON:  05/03/2018 FINDINGS: Artifact overlies the chest. Heart size is normal. Mediastinal shadows are normal. The lungs are clear. The vascularity is normal. No effusions. No significant bone finding. IMPRESSION: No active cardiopulmonary disease. Electronically Signed   By: Paulina Fusi M.D.   On: 10/11/2021 15:10    Assessment & Plan:   Problem List Items Addressed This Visit     Seborrheic dermatitis   Worse Triamc cream bid      Fever - Primary   Viral syndrome vs other Strep  (-) Flu A, B (-) COVID (-) CBC, UA, CMET, mono CXR Pt declined empiric abx Tylenol prn      Relevant Orders   CBC with Differential/Platelet   Comprehensive metabolic panel   Urinalysis   TSH   Mononucleosis screen   DG Chest 2 View   POC COVID-19 (Completed)   POCT Influenza A/B (Completed)   POCT rapid strep A (Completed)   Sore throat   Strep (-) Flu A, B (-) COVID (-)      Weight loss   On diet      Relevant Orders   CBC with Differential/Platelet   Comprehensive metabolic panel   Urinalysis   TSH   Mononucleosis screen   DG Chest 2 View      No orders of the defined types were placed in this encounter.     Follow-up: Return in about 2 weeks (around 05/28/2023) for a follow-up  visit.  Sonda Primes, MD

## 2023-05-14 NOTE — Assessment & Plan Note (Signed)
 Worse Triamc cream bid

## 2023-05-15 ENCOUNTER — Ambulatory Visit (INDEPENDENT_AMBULATORY_CARE_PROVIDER_SITE_OTHER)

## 2023-05-15 ENCOUNTER — Other Ambulatory Visit: Payer: Self-pay | Admitting: Internal Medicine

## 2023-05-15 DIAGNOSIS — R509 Fever, unspecified: Secondary | ICD-10-CM

## 2023-05-15 DIAGNOSIS — R079 Chest pain, unspecified: Secondary | ICD-10-CM | POA: Diagnosis not present

## 2023-05-15 DIAGNOSIS — R634 Abnormal weight loss: Secondary | ICD-10-CM | POA: Diagnosis not present

## 2023-05-15 LAB — CBC WITH DIFFERENTIAL/PLATELET
Basophils Absolute: 0.1 10*3/uL (ref 0.0–0.1)
Basophils Relative: 0.7 % (ref 0.0–3.0)
Eosinophils Absolute: 0.1 10*3/uL (ref 0.0–0.7)
Eosinophils Relative: 0.6 % (ref 0.0–5.0)
HCT: 50.7 % (ref 39.0–52.0)
Hemoglobin: 17.1 g/dL — ABNORMAL HIGH (ref 13.0–17.0)
Lymphocytes Relative: 10.8 % — ABNORMAL LOW (ref 12.0–46.0)
Lymphs Abs: 1 10*3/uL (ref 0.7–4.0)
MCHC: 33.8 g/dL (ref 30.0–36.0)
MCV: 92.6 fl (ref 78.0–100.0)
Monocytes Absolute: 0.9 10*3/uL (ref 0.1–1.0)
Monocytes Relative: 9.2 % (ref 3.0–12.0)
Neutro Abs: 7.3 10*3/uL (ref 1.4–7.7)
Neutrophils Relative %: 78.7 % — ABNORMAL HIGH (ref 43.0–77.0)
Platelets: 277 10*3/uL (ref 150.0–400.0)
RBC: 5.47 Mil/uL (ref 4.22–5.81)
RDW: 13.8 % (ref 11.5–15.5)
WBC: 9.3 10*3/uL (ref 4.0–10.5)

## 2023-05-15 LAB — COMPREHENSIVE METABOLIC PANEL
ALT: 45 U/L (ref 0–53)
AST: 29 U/L (ref 0–37)
Albumin: 4.9 g/dL (ref 3.5–5.2)
Alkaline Phosphatase: 87 U/L (ref 39–117)
BUN: 15 mg/dL (ref 6–23)
CO2: 24 meq/L (ref 19–32)
Calcium: 10 mg/dL (ref 8.4–10.5)
Chloride: 105 meq/L (ref 96–112)
Creatinine, Ser: 1.01 mg/dL (ref 0.40–1.50)
GFR: 91.58 mL/min (ref >60.00)
Glucose, Bld: 140 mg/dL — ABNORMAL HIGH (ref 70–99)
Potassium: 4.4 meq/L (ref 3.5–5.1)
Sodium: 140 meq/L (ref 135–145)
Total Bilirubin: 0.4 mg/dL (ref 0.2–1.2)
Total Protein: 8 g/dL (ref 6.0–8.3)

## 2023-05-15 LAB — URINALYSIS
Bilirubin Urine: NEGATIVE
Hgb urine dipstick: NEGATIVE
Ketones, ur: NEGATIVE
Leukocytes,Ua: NEGATIVE
Nitrite: NEGATIVE
Specific Gravity, Urine: 1.015 (ref 1.000–1.030)
Total Protein, Urine: NEGATIVE
Urine Glucose: NEGATIVE
Urobilinogen, UA: 0.2 (ref 0.0–1.0)
pH: 6 (ref 5.0–8.0)

## 2023-05-15 LAB — TSH: TSH: 2.55 u[IU]/mL (ref 0.35–5.50)

## 2023-05-15 MED ORDER — LORATADINE 10 MG PO TABS: 10.0000 mg | ORAL_TABLET | Freq: Every day | ORAL | 3 refills | Status: AC

## 2023-05-16 ENCOUNTER — Encounter: Payer: Self-pay | Admitting: Internal Medicine

## 2023-05-16 LAB — MONONUCLEOSIS SCREEN: Heterophile, Mono Screen: NEGATIVE

## 2023-05-17 ENCOUNTER — Encounter: Payer: Self-pay | Admitting: Internal Medicine

## 2023-05-25 ENCOUNTER — Ambulatory Visit

## 2023-05-25 VITALS — Ht 66.0 in | Wt 199.0 lb

## 2023-05-25 DIAGNOSIS — Z0000 Encounter for general adult medical examination without abnormal findings: Secondary | ICD-10-CM | POA: Diagnosis not present

## 2023-05-25 NOTE — Patient Instructions (Addendum)
 Bruce Ellison , Thank you for taking time to come for your Medicare Wellness Visit. I appreciate your ongoing commitment to your health goals. Please review the following plan we discussed and let me know if I can assist you in the future.   Referrals/Orders/Follow-Ups/Clinician Recommendations: Aim for 30 minutes of exercise or brisk walking, 6-8 glasses of water, and 5 servings of fruits and vegetables each day. Educated and advised on getting the Hepatitis C vaccine in 2025.  This is a list of the screening recommended for you and due dates:  Health Maintenance  Topic Date Due   Hepatitis C Screening  Never done   Colon Cancer Screening  11/14/2018   Flu Shot  06/04/2023*   Medicare Annual Wellness Visit  05/24/2024   DTaP/Tdap/Td vaccine (2 - Td or Tdap) 09/14/2024   HIV Screening  Completed   HPV Vaccine  Aged Out   COVID-19 Vaccine  Discontinued  *Topic was postponed. The date shown is not the original due date.    Advanced directives: (Declined) Advance directive discussed with you today. Even though you declined this today, please call our office should you change your mind, and we can give you the proper paperwork for you to fill out.  Next Medicare Annual Wellness Visit scheduled for next year: Yes

## 2023-05-25 NOTE — Progress Notes (Signed)
 Subjective:  Please attest and cosign this visit due to patients primary care provider not being in the office at the time the visit was completed.  (Pt of Dr A. Plotnikov)   Bruce Ellison is a 43 y.o. who presents for a Medicare Wellness preventive visit.  Visit Complete: Virtual I connected with  Kayren Eaves on 05/25/23 by a audio enabled telemedicine application and verified that I am speaking with the correct person using two identifiers.  Patient Location: Home  Provider Location: Office/Clinic  I discussed the limitations of evaluation and management by telemedicine. The patient expressed understanding and agreed to proceed.  Vital Signs: Because this visit was a virtual/telehealth visit, some criteria may be missing or patient reported. Any vitals not documented were not able to be obtained and vitals that have been documented are patient reported.  VideoDeclined- This patient declined Librarian, academic. Therefore the visit was completed with audio only.  Persons Participating in Visit: Patient.  AWV Questionnaire: No: Patient Medicare AWV questionnaire was not completed prior to this visit.  Cardiac Risk Factors include: advanced age (>73men, >66 women);male gender;obesity (BMI >30kg/m2)     Objective:    Today's Vitals   05/25/23 1544  Weight: 199 lb (90.3 kg)  Height: 5\' 6"  (1.676 m)   Body mass index is 32.12 kg/m.     05/25/2023    3:43 PM 10/11/2021    7:05 PM 06/30/2017    7:58 AM  Advanced Directives  Does Patient Have a Medical Advance Directive? No No No  Would patient like information on creating a medical advance directive? No - Patient declined No - Patient declined No - Patient declined    Current Medications (verified) Outpatient Encounter Medications as of 05/25/2023  Medication Sig   ARIPiprazole (ABILIFY) 5 MG tablet Take 1 tablet (5 mg total) by mouth daily.   betamethasone dipropionate 0.05 % cream Apply topically  2 (two) times daily as needed.   Cholecalciferol (VITAMIN D3) 50 MCG (2000 UT) capsule Take 1 capsule (2,000 Units total) by mouth daily.   desonide (DESOWEN) 0.05 % cream Apply topically 2 (two) times daily.   diltiazem 2 % GEL Apply 1 Application topically 2 (two) times daily.   HYDROCORTISONE ACE, RECTAL, (PROCTOCORT) 30 MG SUPP 1 pr bid   loratadine (CLARITIN) 10 MG tablet Take 1 tablet (10 mg total) by mouth daily.   mirtazapine (REMERON) 15 MG tablet Take 1 tablet (15 mg total) by mouth at bedtime.   Multiple Vitamin (MULTIVITAMIN WITH MINERALS) TABS Take 1 tablet by mouth every morning.   triamcinolone ointment (KENALOG) 0.5 % Apply 1 Application topically 3 (three) times daily.   venlafaxine XR (EFFEXOR-XR) 75 MG 24 hr capsule Take 1 capsule (75 mg total) by mouth daily with breakfast.   No facility-administered encounter medications on file as of 05/25/2023.    Allergies (verified) Honey bee treatment [bee venom], Penicillins, Sulfa antibiotics, Doxycycline, Morphine, Quetiapine, and Sulfamethoxazole   History: Past Medical History:  Diagnosis Date   Adenomatous colon polyp    tubular   Allergic rhinitis    Anxiety    Depression    GERD (gastroesophageal reflux disease)    Glucose intolerance (impaired glucose tolerance)    HTN (hypertension)    borderline   Insomnia    Obesity    OCD (obsessive compulsive disorder)    Personality disorder (HCC)    with schizoid features   Sleep apnea    Tourette's  per [facility] neuro   Varicose veins of both lower extremities    Vitamin D deficiency    Past Surgical History:  Procedure Laterality Date   adnoidectomy  56   Family History  Problem Relation Age of Onset   Depression Mother    Bipolar disorder Father    Colon cancer Other    Cholelithiasis Maternal Grandmother    Diabetes Maternal Grandmother    Prostate cancer Maternal Grandfather    Colon polyps Maternal Grandfather    Esophageal cancer Neg Hx    Rectal  cancer Neg Hx    Stomach cancer Neg Hx    Social History   Socioeconomic History   Marital status: Single    Spouse name: Not on file   Number of children: 0   Years of education: Not on file   Highest education level: Associate degree: academic program  Occupational History   Occupation: unemployed  Tobacco Use   Smoking status: Never    Passive exposure: Never   Smokeless tobacco: Never  Vaping Use   Vaping status: Never Used  Substance and Sexual Activity   Alcohol use: No    Alcohol/week: 0.0 standard drinks of alcohol   Drug use: No   Sexual activity: Not Currently  Other Topics Concern   Not on file  Social History Narrative   Lives with parents.   Regular exercise- No   Social Drivers of Corporate investment banker Strain: Low Risk  (05/25/2023)   Overall Financial Resource Strain (CARDIA)    Difficulty of Paying Living Expenses: Not hard at all  Recent Concern: Financial Resource Strain - Medium Risk (05/23/2023)   Overall Financial Resource Strain (CARDIA)    Difficulty of Paying Living Expenses: Somewhat hard  Food Insecurity: No Food Insecurity (05/25/2023)   Hunger Vital Sign    Worried About Running Out of Food in the Last Year: Never true    Ran Out of Food in the Last Year: Never true  Recent Concern: Food Insecurity - Food Insecurity Present (05/23/2023)   Hunger Vital Sign    Worried About Running Out of Food in the Last Year: Often true    Ran Out of Food in the Last Year: Often true  Transportation Needs: No Transportation Needs (05/25/2023)   PRAPARE - Administrator, Civil Service (Medical): No    Lack of Transportation (Non-Medical): No  Physical Activity: Sufficiently Active (05/25/2023)   Exercise Vital Sign    Days of Exercise per Week: 7 days    Minutes of Exercise per Session: 60 min  Stress: No Stress Concern Present (05/25/2023)   [employer] of Occupational Health - Occupational Stress Questionnaire    Feeling of Stress :  Not at all  Recent Concern: Stress - Stress Concern Present (05/23/2023)   [employer] of Occupational Health - Occupational Stress Questionnaire    Feeling of Stress : To some extent  Social Connections: Socially Isolated (05/25/2023)   Social Connection and Isolation Panel [NHANES]    Frequency of Communication with Friends and Family: More than three times a week    Frequency of Social Gatherings with Friends and Family: Never    Attends Religious Services: Never    Database administrator or Organizations: No    Attends Engineer, structural: Never    Marital Status: Never married    Tobacco Counseling Counseling given: No    Clinical Intake:  Pre-visit preparation completed: Yes  Pain : No/denies pain  BMI - recorded: 32.12 Nutritional Status: BMI > 30  Obese Nutritional Risks: None Diabetes: No  Lab Results  Component Value Date   HGBA1C 5.4 11/29/2022   HGBA1C 5.7 11/22/2021   HGBA1C 5.5 10/11/2021     How often do you need to have someone help you when you read instructions, pamphlets, or other written materials from your doctor or pharmacy?: 1 - Never  Interpreter Needed?: No  Information entered by :: Hassell Halim, CMA   Activities of Daily Living     05/25/2023    3:46 PM 11/07/2022    4:26 PM  In your present state of health, do you have any difficulty performing the following activities:  Hearing? 0 0  Vision? 0 0  Difficulty concentrating or making decisions? 0 0  Walking or climbing stairs? 0 1  Dressing or bathing? 0 0  Doing errands, shopping? 0 0  Preparing Food and eating ? N N  Using the Toilet? N N  In the past six months, have you accidently leaked urine? N N  Do you have problems with loss of bowel control? N N  Managing your Medications? N N  Managing your Finances? N N  Housekeeping or managing your Housekeeping? N N    Patient Care Team: Plotnikov, Georgina Quint, MD as PCP - General (Internal Medicine) Larina Bras, Marlana Salvage, RN (Inactive) as Registered Nurse [name], Phillips Grout, MD as Consulting Physician (Psychiatry)  Indicate any recent Medical Services you may have received from other than [facility] providers in the past year (date may be approximate).     Assessment:   This is a routine wellness examination for Augustine.  Hearing/Vision screen Hearing Screening - Comments:: Denies hearing difficulties   Vision Screening - Comments:: No difficulties with vision   Goals Addressed               This Visit's Progress     Patient Stated (pt-stated)        Patient stated he plans to continue being active.       Depression Screen     05/25/2023    3:49 PM 11/29/2022    7:56 AM 11/22/2021    8:05 AM  PHQ 2/9 Scores  PHQ - 2 Score 0 3 2  PHQ- 9 Score 1 13 7     Fall Risk     05/25/2023    3:49 PM 11/29/2022    7:56 AM 11/07/2022    4:26 PM 11/22/2021    8:05 AM  Fall Risk   Falls in the past year? 0 0 0 0  Number falls in past yr: 0 0  0  Injury with Fall? 0 0  0  Risk for fall due to : No Fall Risks No Fall Risks  No Fall Risks  Follow up Falls prevention discussed;Falls evaluation completed Falls evaluation completed      MEDICARE RISK AT HOME:  Medicare Risk at Home Any stairs in or around the home?: Yes If so, are there any without handrails?: No Home free of loose throw rugs in walkways, pet beds, electrical cords, etc?: Yes Adequate lighting in your home to reduce risk of falls?: Yes Life alert?: No Use of a cane, walker or w/c?: No Grab bars in the bathroom?: No Shower chair or bench in shower?: No Elevated toilet seat or a handicapped toilet?: No  TIMED UP AND GO:  Was the test performed?  No  Cognitive Function: 6CIT completed  05/25/2023    3:50 PM  6CIT Screen  What Year? 0 points  What month? 0 points  What time? 0 points  Count back from 20 0 points  Months in reverse 0 points  Repeat phrase 2 points  Total Score 2 points    Immunizations Immunization History   Administered Date(s) Administered   Hepatitis B, ADULT 05/05/2014, 06/04/2014, 11/05/2014   Hepatitis B, PED/ADOLESCENT 05/05/2014, 06/04/2014, 11/05/2014   Influenza,inj,Quad PF,6+ Mos 12/06/2012, 12/31/2013, 12/11/2015, 11/26/2017   Influenza-Unspecified 01/04/2015   PPD Test 01/25/2018   Tdap 09/15/2014   Varicella 10/13/2016    Screening Tests Health Maintenance  Topic Date Due   Hepatitis C Screening  Never done   Colonoscopy  11/14/2018   INFLUENZA VACCINE  06/04/2023 (Originally 10/05/2022)   Medicare Annual Wellness (AWV)  05/24/2024   DTaP/Tdap/Td (2 - Td or Tdap) 09/14/2024   HIV Screening  Completed   HPV VACCINES  Aged Out   COVID-19 Vaccine  Discontinued    Health Maintenance  Health Maintenance Due  Topic Date Due   Hepatitis C Screening  Never done   Colonoscopy  11/14/2018   Health Maintenance Items Addressed: 05/25/2023  Additional Screening:  Vision Screening: Recommended annual ophthalmology exams for early detection of glaucoma and other disorders of the eye.  Dental Screening: Recommended annual dental exams for proper oral hygiene  Community Resource Referral / Chronic Care Management: CRR required this visit?  No   CCM required this visit?  No     Plan:     I have personally reviewed and noted the following in the patient's chart:   Medical and social history Use of alcohol, tobacco or illicit drugs  Current medications and supplements including opioid prescriptions. Patient is not currently taking opioid prescriptions. Functional ability and status Nutritional status Physical activity Advanced directives List of other physicians Hospitalizations, surgeries, and ER visits in previous 12 months Vitals Screenings to include cognitive, depression, and falls Referrals and appointments  In addition, I have reviewed and discussed with patient certain preventive protocols, quality metrics, and best practice recommendations. A written  personalized care plan for preventive services as well as general preventive health recommendations were provided to patient.     Darreld Mclean, CMA   05/25/2023   After Visit Summary: (MyChart) Due to this being a telephonic visit, the after visit summary with patients personalized plan was offered to patient via MyChart   Notes: Nothing significant to report at this time.

## 2023-05-29 ENCOUNTER — Telehealth: Admitting: Internal Medicine

## 2023-05-31 ENCOUNTER — Telehealth: Admitting: Internal Medicine

## 2023-06-14 DIAGNOSIS — N138 Other obstructive and reflux uropathy: Secondary | ICD-10-CM | POA: Diagnosis not present

## 2023-06-14 DIAGNOSIS — N401 Enlarged prostate with lower urinary tract symptoms: Secondary | ICD-10-CM | POA: Diagnosis not present

## 2023-06-14 DIAGNOSIS — Z8042 Family history of malignant neoplasm of prostate: Secondary | ICD-10-CM | POA: Diagnosis not present

## 2023-06-14 DIAGNOSIS — N281 Cyst of kidney, acquired: Secondary | ICD-10-CM | POA: Diagnosis not present

## 2023-06-22 ENCOUNTER — Ambulatory Visit: Payer: Self-pay | Admitting: Internal Medicine

## 2023-06-22 NOTE — Telephone Encounter (Signed)
  Chief Complaint: fever x 45 days Symptoms: temp 99.5 chills comes and goes. Has had low grade fever since being sick with cold sx 45 days ago and tested negative for covid Frequency: 45 days Pertinent Negatives: Patient denies chest pain no difficulty breathing no rash no abdominal pain no other sx reported  Disposition: [] ED /[] Urgent Care (no appt availability in office) / [x] Appointment(In office/virtual)/ []  [facility]/ [] Home Care/ [] Refused Recommended Disposition /[] [facility]/ []  Follow-up with PCP Additional Notes:   No available appt with PCP until may. Scheduled with requested MD for 06/27/23. Recommended if sx worsen go to ED.       Copied from CRM [id number]. Topic: Clinical - Red Word Triage >> Jun 22, 2023  8:02 AM [place] A wrote: [facility] that prompted transfer to Nurse Triage: Patient says he has had a fever for 45 days Reason for Disposition  Fever present > 3 days (72 hours)  Answer Assessment - Initial Assessment Questions 1. TEMPERATURE: "What is the most recent temperature?"  "How was it measured?"      99.5 2. ONSET: "When did the fever start?"      45 days ago  3. CHILLS: "Do you have chills?" If yes: "How bad are they?"  (e.g., none, mild, moderate, severe)   - NONE: no chills   - MILD: feeling cold   - MODERATE: feeling very cold, some shivering (feels better under a thick blanket)   - SEVERE: feeling extremely cold with shaking chills (general body shaking, rigors; even under a thick blanket)      Chills comes and goes  4. OTHER SYMPTOMS: "Do you have any other symptoms besides the fever?"  (e.g., abdomen pain, cough, diarrhea, earache, headache, sore throat, urination pain)     Na  5. CAUSE: If there are no symptoms, ask: "What do you think is causing the fever?"      Not anymore , did have sore throat and was tested by PCP 6. CONTACTS: "Does anyone else in the family have an infection?"     no 7. TREATMENT: "What have you done  so far to treat this fever?" (e.g., medications)     N a 8. IMMUNOCOMPROMISE: "Do you have of the following: diabetes, HIV positive, splenectomy, cancer chemotherapy, chronic steroid treatment, transplant patient, etc."     Na  9. PREGNANCY: "Is there any chance you are pregnant?" "When was your last menstrual period?"     na 10. TRAVEL: "Have you traveled out of the country in the last month?" (e.g., travel history, exposures)       na  Protocols used: [facility]

## 2023-06-27 ENCOUNTER — Ambulatory Visit: Admitting: Internal Medicine

## 2023-10-29 ENCOUNTER — Encounter (HOSPITAL_COMMUNITY): Payer: Self-pay | Admitting: Psychiatry

## 2023-10-29 ENCOUNTER — Telehealth (HOSPITAL_BASED_OUTPATIENT_CLINIC_OR_DEPARTMENT_OTHER): Payer: Medicare Other | Admitting: Psychiatry

## 2023-10-29 VITALS — Wt 200.0 lb

## 2023-10-29 DIAGNOSIS — F429 Obsessive-compulsive disorder, unspecified: Secondary | ICD-10-CM

## 2023-10-29 DIAGNOSIS — F411 Generalized anxiety disorder: Secondary | ICD-10-CM

## 2023-10-29 MED ORDER — VENLAFAXINE HCL ER 75 MG PO CP24: 75.0000 mg | ORAL_CAPSULE | Freq: Every day | ORAL | 1 refills | Status: AC

## 2023-10-29 MED ORDER — MIRTAZAPINE 15 MG PO TABS: 15.0000 mg | ORAL_TABLET | Freq: Every day | ORAL | 1 refills | Status: AC

## 2023-10-29 MED ORDER — ARIPIPRAZOLE 5 MG PO TABS: 5.0000 mg | ORAL_TABLET | Freq: Every day | ORAL | 1 refills | Status: AC

## 2023-10-29 NOTE — Progress Notes (Signed)
 [facility] MD Virtual Progress Note   Patient Location: Home Provider Location: Home Office  I connect with patient by video and verified that I am speaking with correct person by using two identifiers. I discussed the limitations of evaluation and management by telemedicine and the availability of in person appointments. I also discussed with the patient that there may be a patient responsible charge related to this service. The patient expressed understanding and agreed to proceed.  Bruce Ellison [id number] 43 y.o.  10/29/2023 9:55 AM  History of Present Illness:  Patient is evaluated by video session.  He reported things are same and he is taking the medication as prescribed.  He has chronic anxiety and OCD symptoms but with the help of the medication they are manageable.  He does not go outside as frequently and mostly he orders things online.  He reported that he has pain in his leg and his joint and he started walking but it increases the intensity so he decided not to walk every day.  However he is watching his calorie intake and drinking enough water.  He continues to have hand sweats when he feels very nervous or anxious.  He has tapping, counting, repetitive behavior but they are chronic and stable.  He does not want to change the medication.  He has a visit with his PCP few months ago but no new medication added.  He sleeps average 6 to 7 hours.  Lives with his mother.  Denies any major panic attack, crying spells or any feeling of hopelessness or worthlessness.  Denies any hallucination or any paranoia.  He has mild tremors but does not interfere in his daily activities.  He denies drinking or using any illegal substances.  Past Psychiatric History: H/O anxiety, Tic and depression since age 51.  Received treatment in [place].  Tried Geodon, Risperdal, Zoloft, Valium , Klonopin, Xanax , Prozac, Ambien, and Seroquel.  Saw psychiatrist at [facility] and then in  [place] and [place].  Tried Luvox  but made him more irritable.  No history of suicidal attempt.   Past Medical History:  Diagnosis Date   Adenomatous colon polyp    tubular   Allergic rhinitis    Anxiety    Depression    GERD (gastroesophageal reflux disease)    Glucose intolerance (impaired glucose tolerance)    HTN (hypertension)    borderline   Insomnia    Obesity    OCD (obsessive compulsive disorder)    Personality disorder (HCC)    with schizoid features   Sleep apnea    Tourette's    per [facility] neuro   Varicose veins of both lower extremities    Vitamin D  deficiency     Outpatient Encounter Medications as of 10/29/2023  Medication Sig   ARIPiprazole  (ABILIFY ) 5 MG tablet Take 1 tablet (5 mg total) by mouth daily.   betamethasone  dipropionate 0.05 % cream Apply topically 2 (two) times daily as needed.   Cholecalciferol (VITAMIN D3) 50 MCG (2000 UT) capsule Take 1 capsule (2,000 Units total) by mouth daily.   desonide  (DESOWEN ) 0.05 % cream Apply topically 2 (two) times daily.   diltiazem  2 % GEL Apply 1 Application topically 2 (two) times daily.   HYDROCORTISONE  ACE, RECTAL, (PROCTOCORT ) 30 MG SUPP 1 pr bid   loratadine  (CLARITIN ) 10 MG tablet Take 1 tablet (10 mg total) by mouth daily.   mirtazapine  (REMERON ) 15 MG tablet Take 1 tablet (15 mg total) by mouth at bedtime.  Multiple Vitamin (MULTIVITAMIN WITH MINERALS) TABS Take 1 tablet by mouth every morning.   triamcinolone  ointment (KENALOG ) 0.5 % Apply 1 Application topically 3 (three) times daily.   venlafaxine  XR (EFFEXOR -XR) 75 MG 24 hr capsule Take 1 capsule (75 mg total) by mouth daily with breakfast.   No facility-administered encounter medications on file as of 10/29/2023.    No results found for this or any previous visit (from the past 2160 hours).   Psychiatric Specialty Exam: Physical Exam  Review of Systems  Weight 200 lb (90.7 kg).There is no height or weight on file to calculate BMI.  General  Appearance: Casual  Eye Contact:  shy  Speech:  Slow  Volume:  Decreased  Mood:  Anxious  Affect:  Congruent  Thought Process:  Goal Directed  Orientation:  Full (Time, Place, and Person)  Thought Content:  Obsessions  Suicidal Thoughts:  No  Homicidal Thoughts:  No  Memory:  Immediate;   Good Recent;   Good Remote;   Good  Judgement:  Intact  Insight:  Present  Psychomotor Activity:  Tremor  Concentration:  Concentration: Good and Attention Span: Good  Recall:  Good  Fund of Knowledge:  Good  Language:  Fair  Akathisia:  No  Handed:  Right  AIMS (if indicated):     Assets:  Communication Skills Desire for Improvement Housing Social Support Transportation  ADL's:  Intact  Cognition:  WNL  Sleep:  average 7 hrs       05/25/2023    3:49 PM 11/29/2022    7:56 AM 11/22/2021    8:05 AM  Depression screen PHQ 2/9  Decreased Interest 0 1 1  Down, Depressed, Hopeless 0 2 1  PHQ - 2 Score 0 3 2  Altered sleeping 0 2 1  Tired, decreased energy 1 2 2   Change in appetite 0 1 0  Feeling bad or failure about yourself  0 1 1  Trouble concentrating 0 3 1  Moving slowly or fidgety/restless 0 1 0  Suicidal thoughts 0 0 0  PHQ-9 Score 1 13 7   Difficult doing work/chores Not difficult at all Very difficult Extremely dIfficult    Assessment/Plan: Obsessive-compulsive disorder, unspecified type - Plan: ARIPiprazole  (ABILIFY ) 5 MG tablet, mirtazapine  (REMERON ) 15 MG tablet  GAD (generalized anxiety disorder) - Plan: mirtazapine  (REMERON ) 15 MG tablet, venlafaxine  XR (EFFEXOR -XR) 75 MG 24 hr capsule  Patient is 43 year old single male with history of supportive dermatitis, annular fissure, varicose vein, sleep apnea, OCD and generalized anxiety disorder.  Currently maintained his symptoms on Abilify , Remeron  and venlafaxine .  Reviewed current medication and so far no major concern other than mild tremors but does not interfere in his daily activities.  He is reluctant to cut down the  dose.  Encouraged hydration and watching his calorie intake.  Reviewed blood work results from PCP which was done in March.  Discussed medication side effects and benefits.  Continue Abilify  5 mg daily, mirtazapine  15 mg at bedtime and Effexor  75 mg daily.  Recommended to call back with any question or any concern.  Follow-up in 6 months.   Follow Up Instructions:     I discussed the assessment and treatment plan with the patient. The patient was provided an opportunity to ask questions and all were answered. The patient agreed with the plan and demonstrated an understanding of the instructions.   The patient was advised to call back or seek an in-person evaluation if the symptoms worsen or if the condition  fails to improve as anticipated.    Collaboration of Care: Other provider involved in patient's care AEB notes are available in epic to review  Patient/Guardian was advised Release of Information must be obtained prior to any record release in order to collaborate their care with an outside provider. Patient/Guardian was advised if they have not already done so to contact the registration department to sign all necessary forms in order for us  to release information regarding their care.   Consent: Patient/Guardian gives verbal consent for treatment and assignment of benefits for services provided during this visit. Patient/Guardian expressed understanding and agreed to proceed.     Total encounter time 18 minutes which includes face-to-face time, chart reviewed, care coordination, order entry and documentation during this encounter.   Note: This document was prepared by [employer] voice dictation technology and any errors that results from this process are unintentional.    Leni ONEIDA Client, MD 10/29/2023

## 2023-12-03 ENCOUNTER — Ambulatory Visit: Admitting: Internal Medicine

## 2023-12-19 ENCOUNTER — Ambulatory Visit: Payer: Self-pay

## 2023-12-19 NOTE — Telephone Encounter (Signed)
 FYI Only or Action Required?: FYI only for provider.  Patient was last seen in primary care on 05/14/2023 by Plotnikov, Karlynn GAILS, MD.  Called Nurse Triage reporting Foot Pain.  Symptoms began a week ago.  Interventions attempted: Rest, hydration, or home remedies.  Symptoms are: gradually worsening.  Triage Disposition: See PCP When Office is Open (Within 3 Days)  Patient/caregiver understands and will follow disposition?: Yes Reason for Disposition  [1] MODERATE pain (e.g., interferes with normal activities, limping) AND [2] present > 3 days  Answer Assessment - Initial Assessment Questions Pt reports one week hx of spontaneous, atraumatic left heel pain. Denies discoloration.   1. ONSET: When did the pain start?      One week  2. LOCATION: Where is the pain located?      Heel of left foot  3. PAIN: How bad is the pain?    (Scale 1-10; or mild, moderate, severe)     Throbbing 7/10  4. WORK OR EXERCISE: Has there been any recent work or exercise that involved this part of the body?      Denies  5. CAUSE: What do you think is causing the foot pain?     Unknown  6. OTHER SYMPTOMS: Do you have any other symptoms? (e.g., leg pain, rash, fever, numbness)     Cannot tell if it is swollen  Protocols used: Foot Pain-A-AH Copied from CRM [id number]. Topic: Clinical - Red Word Triage >> Dec 19, 2023  1:14 PM Eva FALCON wrote: Red Word that prompted transfer to Nurse Triage: Severe pain in left heel, throbbing pain/ worse when walking. Unsure if swollen, can't tell.

## 2023-12-20 ENCOUNTER — Ambulatory Visit: Admitting: Emergency Medicine

## 2024-04-28 ENCOUNTER — Telehealth (HOSPITAL_COMMUNITY): Admitting: Psychiatry

## 2024-05-09 ENCOUNTER — Ambulatory Visit

## 2024-05-28 ENCOUNTER — Ambulatory Visit
